# Patient Record
Sex: Male | Born: 1950
Health system: Southern US, Community
[De-identification: ages and names within clinical notes are randomized; demographics above are authoritative.]

## PROBLEM LIST (undated history)

## (undated) ENCOUNTER — Emergency Department (HOSPITAL_COMMUNITY): Payer: Medicare Other

## (undated) DIAGNOSIS — R51 Headache: Secondary | ICD-10-CM

## (undated) DIAGNOSIS — M25511 Pain in right shoulder: Secondary | ICD-10-CM

## (undated) DIAGNOSIS — I219 Acute myocardial infarction, unspecified: Secondary | ICD-10-CM

## (undated) DIAGNOSIS — E785 Hyperlipidemia, unspecified: Secondary | ICD-10-CM

## (undated) DIAGNOSIS — J45909 Unspecified asthma, uncomplicated: Secondary | ICD-10-CM

## (undated) DIAGNOSIS — I10 Essential (primary) hypertension: Secondary | ICD-10-CM

## (undated) DIAGNOSIS — T7840XA Allergy, unspecified, initial encounter: Secondary | ICD-10-CM

## (undated) DIAGNOSIS — G8929 Other chronic pain: Secondary | ICD-10-CM

## (undated) DIAGNOSIS — I700 Atherosclerosis of aorta: Secondary | ICD-10-CM

## (undated) DIAGNOSIS — J449 Chronic obstructive pulmonary disease, unspecified: Secondary | ICD-10-CM

## (undated) DIAGNOSIS — M1990 Unspecified osteoarthritis, unspecified site: Secondary | ICD-10-CM

## (undated) DIAGNOSIS — R932 Abnormal findings on diagnostic imaging of liver and biliary tract: Secondary | ICD-10-CM

## (undated) DIAGNOSIS — M549 Dorsalgia, unspecified: Secondary | ICD-10-CM

## (undated) HISTORY — DX: Pain in right shoulder: M25.511

## (undated) HISTORY — DX: Allergy, unspecified, initial encounter: T78.40XA

## (undated) HISTORY — DX: Unspecified osteoarthritis, unspecified site: M19.90

## (undated) HISTORY — DX: Other chronic pain: G89.29

## (undated) HISTORY — DX: Dorsalgia, unspecified: M54.9

## (undated) HISTORY — DX: Hyperlipidemia, unspecified: E78.5

## (undated) HISTORY — DX: Chronic obstructive pulmonary disease, unspecified: J44.9

## (undated) HISTORY — DX: Unspecified asthma, uncomplicated: J45.909

## (undated) HISTORY — PX: NO PAST SURGERIES: SHX2092

---

## 2004-12-19 ENCOUNTER — Emergency Department (HOSPITAL_COMMUNITY): Admission: EM | Admit: 2004-12-19 | Discharge: 2004-12-20 | Payer: Self-pay | Admitting: Emergency Medicine

## 2004-12-20 ENCOUNTER — Emergency Department (HOSPITAL_COMMUNITY): Admission: EM | Admit: 2004-12-20 | Discharge: 2004-12-20 | Payer: Self-pay | Admitting: Emergency Medicine

## 2004-12-26 ENCOUNTER — Emergency Department (HOSPITAL_COMMUNITY): Admission: EM | Admit: 2004-12-26 | Discharge: 2004-12-26 | Payer: Self-pay | Admitting: Family Medicine

## 2005-01-08 ENCOUNTER — Ambulatory Visit: Payer: Self-pay | Admitting: Internal Medicine

## 2005-01-22 ENCOUNTER — Ambulatory Visit: Payer: Self-pay | Admitting: Internal Medicine

## 2005-05-10 ENCOUNTER — Ambulatory Visit: Payer: Self-pay | Admitting: Internal Medicine

## 2005-05-10 ENCOUNTER — Encounter (INDEPENDENT_AMBULATORY_CARE_PROVIDER_SITE_OTHER): Payer: Self-pay | Admitting: Internal Medicine

## 2005-05-15 ENCOUNTER — Ambulatory Visit: Payer: Self-pay | Admitting: Internal Medicine

## 2005-05-22 ENCOUNTER — Ambulatory Visit: Payer: Self-pay | Admitting: Internal Medicine

## 2005-05-22 ENCOUNTER — Encounter (INDEPENDENT_AMBULATORY_CARE_PROVIDER_SITE_OTHER): Payer: Self-pay | Admitting: *Deleted

## 2005-10-17 ENCOUNTER — Ambulatory Visit: Payer: Self-pay | Admitting: Internal Medicine

## 2005-10-29 ENCOUNTER — Ambulatory Visit: Payer: Self-pay | Admitting: Hospitalist

## 2006-01-14 ENCOUNTER — Ambulatory Visit (HOSPITAL_COMMUNITY): Admission: RE | Admit: 2006-01-14 | Discharge: 2006-01-14 | Payer: Self-pay | Admitting: *Deleted

## 2006-01-14 ENCOUNTER — Ambulatory Visit: Payer: Self-pay | Admitting: Internal Medicine

## 2006-01-22 ENCOUNTER — Encounter: Admission: RE | Admit: 2006-01-22 | Discharge: 2006-02-06 | Payer: Self-pay | Admitting: *Deleted

## 2006-05-08 DIAGNOSIS — Z8719 Personal history of other diseases of the digestive system: Secondary | ICD-10-CM | POA: Insufficient documentation

## 2006-05-08 DIAGNOSIS — I10 Essential (primary) hypertension: Secondary | ICD-10-CM | POA: Insufficient documentation

## 2006-05-08 DIAGNOSIS — M545 Low back pain, unspecified: Secondary | ICD-10-CM | POA: Insufficient documentation

## 2006-05-08 DIAGNOSIS — F17200 Nicotine dependence, unspecified, uncomplicated: Secondary | ICD-10-CM | POA: Insufficient documentation

## 2006-05-08 DIAGNOSIS — M1990 Unspecified osteoarthritis, unspecified site: Secondary | ICD-10-CM | POA: Insufficient documentation

## 2006-07-10 DIAGNOSIS — K5730 Diverticulosis of large intestine without perforation or abscess without bleeding: Secondary | ICD-10-CM | POA: Insufficient documentation

## 2006-07-10 DIAGNOSIS — Z860100 Personal history of colon polyps, unspecified: Secondary | ICD-10-CM | POA: Insufficient documentation

## 2007-01-01 ENCOUNTER — Encounter (INDEPENDENT_AMBULATORY_CARE_PROVIDER_SITE_OTHER): Payer: Self-pay | Admitting: Internal Medicine

## 2010-07-06 ENCOUNTER — Inpatient Hospital Stay (HOSPITAL_COMMUNITY)
Admission: EM | Admit: 2010-07-06 | Discharge: 2010-07-09 | Payer: Self-pay | Source: Home / Self Care | Attending: Internal Medicine | Admitting: Internal Medicine

## 2010-07-06 LAB — CK TOTAL AND CKMB (NOT AT ARMC)
CK, MB: 3.7 ng/mL (ref 0.3–4.0)
Relative Index: 3 — ABNORMAL HIGH (ref 0.0–2.5)
Total CK: 123 U/L (ref 7–232)

## 2010-07-06 LAB — CBC
HCT: 48.8 % (ref 39.0–52.0)
Hemoglobin: 16.6 g/dL (ref 13.0–17.0)
MCH: 32.2 pg (ref 26.0–34.0)
MCHC: 34 g/dL (ref 30.0–36.0)
MCV: 94.8 fL (ref 78.0–100.0)
Platelets: 170 10*3/uL (ref 150–400)
RBC: 5.15 MIL/uL (ref 4.22–5.81)
RDW: 14 % (ref 11.5–15.5)
WBC: 5.2 10*3/uL (ref 4.0–10.5)

## 2010-07-06 LAB — URINALYSIS, ROUTINE W REFLEX MICROSCOPIC
Bilirubin Urine: NEGATIVE
Ketones, ur: NEGATIVE mg/dL
Leukocytes, UA: NEGATIVE
Nitrite: NEGATIVE
Protein, ur: 30 mg/dL — AB
Specific Gravity, Urine: 1.013 (ref 1.005–1.030)
Urine Glucose, Fasting: NEGATIVE mg/dL
Urobilinogen, UA: 0.2 mg/dL (ref 0.0–1.0)
pH: 6 (ref 5.0–8.0)

## 2010-07-06 LAB — BASIC METABOLIC PANEL
BUN: 16 mg/dL (ref 6–23)
CO2: 23 mEq/L (ref 19–32)
Calcium: 9.6 mg/dL (ref 8.4–10.5)
Chloride: 106 mEq/L (ref 96–112)
Creatinine, Ser: 1.22 mg/dL (ref 0.4–1.5)
GFR calc Af Amer: >60 mL/min (ref >60)
GFR calc non Af Amer: >60 mL/min (ref >60)
Glucose, Bld: 111 mg/dL — ABNORMAL HIGH (ref 70–99)
Potassium: 3.8 mEq/L (ref 3.5–5.1)
Sodium: 141 mEq/L (ref 135–145)

## 2010-07-06 LAB — PROTIME-INR
INR: 1.1 (ref 0.00–1.49)
Prothrombin Time: 14.4 seconds (ref 11.6–15.2)

## 2010-07-06 LAB — DIFFERENTIAL
Basophils Absolute: 0 10*3/uL (ref 0.0–0.1)
Basophils Relative: 1 % (ref 0–1)
Eosinophils Absolute: 0 10*3/uL (ref 0.0–0.7)
Eosinophils Relative: 0 % (ref 0–5)
Lymphocytes Relative: 37 % (ref 12–46)
Lymphs Abs: 1.9 10*3/uL (ref 0.7–4.0)
Monocytes Absolute: 0.5 10*3/uL (ref 0.1–1.0)
Monocytes Relative: 10 % (ref 3–12)
Neutro Abs: 2.7 10*3/uL (ref 1.7–7.7)
Neutrophils Relative %: 53 % (ref 43–77)

## 2010-07-06 LAB — URINE MICROSCOPIC-ADD ON

## 2010-07-06 LAB — APTT: aPTT: 32 seconds (ref 24–37)

## 2010-07-06 LAB — TROPONIN I: Troponin I: 0.05 ng/mL (ref 0.00–0.06)

## 2010-07-16 LAB — BASIC METABOLIC PANEL
BUN: 9 mg/dL (ref 6–23)
BUN: 13 mg/dL (ref 6–23)
CO2: 24 mEq/L (ref 19–32)
CO2: 25 mEq/L (ref 19–32)
Calcium: 8.9 mg/dL (ref 8.4–10.5)
Calcium: 9.3 mg/dL (ref 8.4–10.5)
Chloride: 104 mEq/L (ref 96–112)
Chloride: 104 mEq/L (ref 96–112)
Creatinine, Ser: 1.33 mg/dL (ref 0.4–1.5)
Creatinine, Ser: 1.25 mg/dL (ref 0.4–1.5)
GFR calc Af Amer: >60 mL/min (ref >60)
GFR calc Af Amer: >60 mL/min (ref >60)
GFR calc non Af Amer: 55 mL/min — ABNORMAL LOW (ref >60)
GFR calc non Af Amer: 60 mL/min — ABNORMAL LOW (ref >60)
Glucose, Bld: 115 mg/dL — ABNORMAL HIGH (ref 70–99)
Glucose, Bld: 100 mg/dL — ABNORMAL HIGH (ref 70–99)
Potassium: 3.2 mEq/L — ABNORMAL LOW (ref 3.5–5.1)
Potassium: 3.4 mEq/L — ABNORMAL LOW (ref 3.5–5.1)
Sodium: 137 mEq/L (ref 135–145)
Sodium: 139 mEq/L (ref 135–145)

## 2010-07-16 LAB — RAPID URINE DRUG SCREEN, HOSP PERFORMED
Amphetamines: NOT DETECTED
Barbiturates: NOT DETECTED
Benzodiazepines: NOT DETECTED
Cocaine: POSITIVE — AB
Opiates: NOT DETECTED
Tetrahydrocannabinol: NOT DETECTED

## 2010-07-16 LAB — CARDIAC PANEL(CRET KIN+CKTOT+MB+TROPI)
CK, MB: 3.1 ng/mL (ref 0.3–4.0)
CK, MB: 2.5 ng/mL (ref 0.3–4.0)
Relative Index: INVALID (ref 0.0–2.5)
Relative Index: INVALID (ref 0.0–2.5)
Total CK: 89 U/L (ref 7–232)
Total CK: 81 U/L (ref 7–232)
Troponin I: 0.04 ng/mL (ref 0.00–0.06)
Troponin I: 0.05 ng/mL (ref 0.00–0.06)

## 2010-07-16 LAB — CBC
HCT: 45 % (ref 39.0–52.0)
HCT: 43.1 % (ref 39.0–52.0)
Hemoglobin: 14.7 g/dL (ref 13.0–17.0)
Hemoglobin: 13.7 g/dL (ref 13.0–17.0)
MCH: 31.1 pg (ref 26.0–34.0)
MCH: 30.3 pg (ref 26.0–34.0)
MCHC: 32.7 g/dL (ref 30.0–36.0)
MCHC: 31.8 g/dL (ref 30.0–36.0)
MCV: 95.1 fL (ref 78.0–100.0)
MCV: 95.4 fL (ref 78.0–100.0)
Platelets: 156 10*3/uL (ref 150–400)
Platelets: 163 10*3/uL (ref 150–400)
RBC: 4.73 MIL/uL (ref 4.22–5.81)
RBC: 4.52 MIL/uL (ref 4.22–5.81)
RDW: 13.9 % (ref 11.5–15.5)
RDW: 13.9 % (ref 11.5–15.5)
WBC: 8.2 10*3/uL (ref 4.0–10.5)
WBC: 8.3 10*3/uL (ref 4.0–10.5)

## 2010-07-16 LAB — HEMOGLOBIN A1C
Hgb A1c MFr Bld: 5.7 % — ABNORMAL HIGH (ref <5.7)
Mean Plasma Glucose: 117 mg/dL — ABNORMAL HIGH (ref <117)

## 2010-07-16 LAB — CK TOTAL AND CKMB (NOT AT ARMC)
CK, MB: 3.2 ng/mL (ref 0.3–4.0)
Relative Index: INVALID (ref 0.0–2.5)
Total CK: 94 U/L (ref 7–232)

## 2010-07-16 LAB — MRSA PCR SCREENING: MRSA by PCR: NEGATIVE

## 2010-07-16 LAB — HEPATIC FUNCTION PANEL
ALT: 18 U/L (ref 0–53)
AST: 20 U/L (ref 0–37)
Albumin: 3.2 g/dL — ABNORMAL LOW (ref 3.5–5.2)
Alkaline Phosphatase: 69 U/L (ref 39–117)
Bilirubin, Direct: 0.2 mg/dL (ref 0.0–0.3)
Indirect Bilirubin: 0.7 mg/dL (ref 0.3–0.9)
Total Bilirubin: 0.9 mg/dL (ref 0.3–1.2)
Total Protein: 6 g/dL (ref 6.0–8.3)

## 2010-07-16 LAB — TSH: TSH: 0.759 u[IU]/mL (ref 0.350–4.500)

## 2010-07-16 LAB — LIPID PANEL
Cholesterol: 175 mg/dL (ref 0–200)
HDL: 117 mg/dL (ref >39)
LDL Cholesterol: 32 mg/dL (ref 0–99)
Total CHOL/HDL Ratio: 1.5 RATIO
Triglycerides: 130 mg/dL (ref <150)
VLDL: 26 mg/dL (ref 0–40)

## 2010-07-16 LAB — TROPONIN I: Troponin I: 0.04 ng/mL (ref 0.00–0.06)

## 2010-07-20 NOTE — H&P (Signed)
NAME:  Steven Delacruz, Steven Delacruz             ACCOUNT NO.:  [id number]  MEDICAL RECORD NO.:  [id number]          PATIENT TYPE:  INP  LOCATION:  0101                         FACILITY:  [facility]  PHYSICIAN:  Hartley Barefoot, MD    DATE OF BIRTH:  [birth year]  DATE OF ADMISSION:  07/06/2010 DATE OF DISCHARGE:                             HISTORY & PHYSICAL   PRIMARY CARE PROVIDER:  Dr. Dareen Piano.  Patient is being admitted to triad hospitalist, [facility], team #4.  CHIEF COMPLAINT:  Chest pain.  HISTORY OF PRESENT ILLNESS:  Steven Delacruz is a very pleasant 60 year old male with a history of uncontrolled hypertension who presents to the [facility] ED with a chief complaint of chest pain.  Information is obtained from the patient.  He states that last evening, he developed substernal chest pain.  He describes the pain as nonradiating, tightness, and squeezing sensation.  He states that the pain was intermittent and he thought it was heartburn.  This morning, he went on to work and noticed that the pain increased with physical exertion.  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  He went to the employee health nurse who referred him to the emergency room for hypertension.  Upon arrival to the ED, his blood pressure was 233/134. The patient admits being diagnosed with hypertension approximately 5 years ago, was on medication, but has not taking any medication for the last 2 years.  The patient denies any headache, dizziness, shortness of breath, diaphoresis, nausea, or vomiting.  He does report some brief tingling of his left arm last week.  He denies any weakness of extremities or syncope or near syncope.  He rates the pain at 9 out of 10 at worst and 5 out of 10 at best.  He indicates that the pain is not increased with deep breathing.  Exertion makes the pain worse.  Rest makes the pain better.  Nitroglycerin drip was started in the ED.  At the time of my interview, his blood  pressure was 206/128.  Symptoms came on, gradually have persisted, are characterized as moderate to severe. We are asked to admit for further evaluation and treatment.  ALLERGIES:  No known drug allergies.  PAST MEDICAL HISTORY:  Hypertension.  PAST SURGICAL HISTORY:  None.  FAMILY MEDICAL HISTORY:  Mother deceased in her late 76s.  She was diabetic and had an MI.  Father deceased in his late 22s.  He had hypertension and died from an MI.  There is no cancer or stroke history.  SOCIAL HISTORY:  The patient lives alone.  He smokes 1-2 packs a day and has done so since he was 60 years old.  He also drinks four 40 ounce beers daily.  He denies any other drug use.  He is employed as an Training and development officer person at [employer].  MEDICATIONS:  None.  REVIEW OF SYSTEMS:  GENERAL:  Negative for fever, chills, anorexia, unintentional weight loss.  ENT: Negative for ear pain, nasal congestion, sore throat.  CV:  See HPI.  RESPIRATORY:  Negative for shortness of breath or cough.  MUSCULOSKELETAL:  Negative for joint  pain, muscle weakness.  NEURO:  Negative for headache, dizziness, photophobia.  GI:  Negative for abdominal pain, nausea, vomiting, constipation, diarrhea.  GU:  Negative for dysuria, hematuria, frequency, or urgency.  PSYCH:  Negative for depression, anxiety.  HEME: Negative for any unusual bruising or bleeding.  LABORATORY DATA:  Sodium 141, potassium 3.8, chloride 106, CO2 of 23, BUN 16, creatinine 1.2, glucose 111.  WBCs 5.2, hemoglobin 16.6, hematocrit 48.8, platelets are 170.  Urinalysis was negative.  PTT 32, PT 14.4, INR 1.1.  Total CK 123.  CK-MB 3.7.  Troponin 0.05.  EKG was reviewed by Dr. Sunnie Nielsen.  Chest x-ray yields probable slight upper lobe emphysema.  No acute findings.  PHYSICAL EXAMINATION:  VITAL SIGNS:  Temperature 96.6, blood pressure 206/128, heart rate 94, respirations 16, saturations 98% on 2 L. GENERAL:  Awake, alert, well nourished, well  hydrated, in no acute distress. HEENT:  Head, normocephalic, atraumatic.  Pupils equal, round, reactive to light.  EOMI.  Mucous membranes are moist and pink.  No obvious lesion or exudate in his nose or ears. NECK:  Supple.  No JVD.  Full range of motion.  No lymphadenopathy. CV: Regular rate and rhythm.  No murmur, gallop, or rub. RESPIRATORY:  No increased work of breathing.  Breath sounds are clear to auscultation bilaterally.  No rhonchi, wheezes, or rales. ABDOMEN:  Round, soft, positive bowel sounds throughout, nontender to palpation.  No mass or organomegaly noted. NEURO:  Alert and oriented x3.  Speech clear.  Facial symmetry.  Cranial nerves II through XII grossly intact. MUSCULOSKELETAL:  Moves all extremities.  No joint swelling/erythema. Full range of motion. EXTREMITIES:  Without clubbing and cyanosis.  No lower extremity edema. Pedal pulses are present and palpable.  ASSESSMENT AND PLAN: 1. Chest pain.  Risks factors include uncontrolled hypertension,     smoking, strong family history, we will admit to step-down.  We     will cycle cardiac enzymes, get serial EKGs, provide O2 support.  We will offer antiemetics and analgesics p.r.n.  We ill get a 2-D     echo and a CT angio of the chest, abdomen, and pelvis to rule out     dissection.  We will start aspirin, statin. 2. Hypertensive Emergency, Blood pressure is 204/144     on exam.  We will continue nitroglycerin drip with a systolic blood     pressure goal of 180 initially, diastolic goal range 90-100.  We will also     add lisinopril and Norvasc.  We will request a cardiology consult. Check UDS. 3. EtOH abuse.  The patient drinks four 40 ounces of beer a day.  We     will start EtOH withdrawal protocol as needed. 4. Ongoing tobacco use.  We will provide tobacco cessation counseling. 5. Deep vein thrombosis prophylaxis.  We will use Lovenox. 6. Code status.  Patient is a full code.  This assessment and plan was  discussed with Dr. Sunnie Nielsen.     Gwenyth Bender, NP   ______________________________ Hartley Barefoot, MD    KMB/MEDQ  D:  07/06/2010  T:  07/06/2010  Job:  [id number]  Electronically Signed by Hartley Barefoot MD on 07/06/2010 10:17:06 PM Electronically Signed by Toya Smothers  on 07/20/2010 08:25:55 AM

## 2010-07-26 NOTE — Discharge Summary (Addendum)
  NAME:  Steven Delacruz, FOUCH             ACCOUNT NO.:  [id number]  MEDICAL RECORD NO.:  [id number]          PATIENT TYPE:  INP  LOCATION:  1236                         FACILITY:  [facility]  PHYSICIAN:  Angeliz Settlemyre I Daylon Lafavor, MD      DATE OF BIRTH:  12-18-1950  DATE OF ADMISSION:  07/06/2010 DATE OF DISCHARGE:  07/09/2010                              DISCHARGE SUMMARY   PRIMARY CARE PHYSICIAN:  Dr. Dareen Piano.  DISCHARGE DIAGNOSES: 1. Hypertensive urgency/emergency. 2. Chest pain, felt to be secondary to hypertensive emergency. 3. Hypokalemia. 4. Cocaine abuse and alcohol abuse. 5. Ongoing tobacco abuse.  DISCHARGE MEDICATIONS: 1. Norvasc 10 mg p.o. daily. 2. Aspirin 81 mg. 3. Folic acid 1 mg p.o. daily. 4. Hydralazine 75 mg p.o. q.8 h. 5. Multivitamin 1 tab daily. 6. Thiamine 100 mg p.o. daily. 7. Hydrochlorothiazide 25 mg p.o. daily.  CONSULTATION:  Cardiology consulted, done by Dr. Lacretia Nicks. Viann Fish.  PROCEDURES: 1. 2-D echo.  Normal cavity wall.  severe LVH, systolic     function within normal.  Ejection fraction 60% to 65%.  There was     no regional wall motion abnormality.  Doppler parameter consistent     with abnormal  left ventricular relaxation. 2. CT angiogram, negative emphysematous change involving the upper     lobe. 3. CT of abdomen and pelvis:  No evidence of aortic aneurysm.     Moderate atherosclerotic disease.  Diffuse colonic diverticulosis,     bilateral inguinal hernia containing fat.  HISTORY OF PRESENT ILLNESS:  This is a 60 year old male with a history of uncontrolled hypertension presented with chief complaint of chest pain.  The patient is noncompliant with his antihypertensive medication. He has stopped taking them for more than 2 years.  He presented with substernal chest pain, nonradiating with tightness, and a squeezing sensation.  He stated that the pain was intermittent and he thought it was heartburn.  In the ED, he was found to have blood pressure  233/134.  The patient was admitted to step down unit and kept under close observation and titrating his blood pressure medication carefully, was done to avoid formation of any pulmonary edema and avoiding any beta-blocker secondary to his cocaine abuse, which documented by his urinalysis.  The patient will be discharged with hydralazine 75 mg, Norvasc, and hydrochlorothiazide.  The patient was advised to follow up with his primary care physician as early as next week, also counseling again on his cocaine, alcohol, and smoking was done by me during his hospital stay.  Chest pain, atypical.  Felt to be secondary to hypertensive emergency. Currently, we felt that the patient is medically stable to be discharged, need to follow up with his primary care physician as early as next week.     [name], MD     HIE/MEDQ  D:  07/09/2010  T:  07/10/2010  Job:  [id number]  Electronically Signed by Ebony Cargo MD on 07/26/2010 11:54:42 AM

## 2010-07-31 NOTE — Procedures (Signed)
Summary: Colonoscopy: Dr.Gessner  Colonoscopy: Dr.Gessner   Imported By: Florinda Marker 01/01/2007 14:48:55  _____________________________________________________________________  External Attachment:    Type:   Image     Comment:   External Document

## 2012-12-10 ENCOUNTER — Emergency Department (HOSPITAL_COMMUNITY): Payer: Self-pay

## 2012-12-10 ENCOUNTER — Emergency Department (HOSPITAL_COMMUNITY)
Admission: EM | Admit: 2012-12-10 | Discharge: 2012-12-10 | Disposition: A | Discharge status: Home / Self Care | Payer: Self-pay | Attending: Emergency Medicine | Admitting: Emergency Medicine

## 2012-12-10 ENCOUNTER — Encounter (HOSPITAL_COMMUNITY): Payer: Self-pay | Admitting: *Deleted

## 2012-12-10 DIAGNOSIS — M545 Low back pain, unspecified: Secondary | ICD-10-CM | POA: Insufficient documentation

## 2012-12-10 DIAGNOSIS — M549 Dorsalgia, unspecified: Secondary | ICD-10-CM

## 2012-12-10 DIAGNOSIS — Z880 Allergy status to penicillin: Secondary | ICD-10-CM | POA: Insufficient documentation

## 2012-12-10 DIAGNOSIS — M25519 Pain in unspecified shoulder: Secondary | ICD-10-CM | POA: Insufficient documentation

## 2012-12-10 DIAGNOSIS — M25511 Pain in right shoulder: Secondary | ICD-10-CM

## 2012-12-10 HISTORY — DX: Essential (primary) hypertension: I10

## 2012-12-10 MED ORDER — HYDROCODONE-ACETAMINOPHEN 5-325 MG PO TABS: 1.0000 | ORAL_TABLET | ORAL | Status: DC | PRN

## 2012-12-10 NOTE — Progress Notes (Signed)
Consulted by ED RN about pt voiced concerned about being uninsured Cm referred to [facility] staff member

## 2012-12-10 NOTE — Progress Notes (Signed)
[facility] has seen patient and provided him with a oc application. °

## 2012-12-10 NOTE — ED Provider Notes (Signed)
History     CSN: [id number]  Arrival date & time 12/10/12  1131   First MD Initiated Contact with Patient 12/10/12 1142      No chief complaint on file.   (Consider location/radiation/quality/duration/timing/severity/associated sxs/prior treatment) HPI Comments: Pt presents to the ED for low back pain and right shoulder pain.  States these areas have been bothering him for several years but have recently gotten worse.  Pain described as an aching sensation, worse in the morning, with some radiation into BLE and right arm.  Notes intermittent paresthesias of RUE, relieved by shaking his arm and moving fingers.  No recent injury, trauma, or fall.  No loss of bowel or bladder function.  No saddle or LE numbness or paresthesias.  Gait at baseline- walks assisted with cane.  Has been taking aleve and tramadol without significant relief of sx.  Pt does not currently have a PCP.  The history is provided by the patient.    No past medical history on file.  No past surgical history on file.  No family history on file.  History  Substance Use Topics  . Smoking status: Not on file  . Smokeless tobacco: Not on file  . Alcohol Use: Not on file      Review of Systems  Musculoskeletal: Positive for arthralgias.  All other systems reviewed and are negative.    Allergies  Penicillins  Home Medications  No current outpatient prescriptions on file.  BP 144/87  Pulse 66  Temp(Src) 98.1 F (36.7 C) (Oral)  Resp 18  SpO2 99%  Physical Exam  Nursing note and vitals reviewed. Constitutional: He is oriented to person, place, and time. He appears well-developed and well-nourished.  HENT:  Head: Normocephalic and atraumatic.  Eyes: Conjunctivae and EOM are normal.  Neck: Normal range of motion. Neck supple.  Cardiovascular: Normal rate, regular rhythm and normal heart sounds.   Pulmonary/Chest: Effort normal and breath sounds normal. No respiratory distress. He has no wheezes.   Musculoskeletal: Normal range of motion.       Right shoulder: He exhibits normal range of motion, no tenderness, no bony tenderness, no swelling, no crepitus, no deformity, no laceration, no spasm, normal pulse and normal strength.       Cervical back: He exhibits tenderness (right side of trapezius) and pain. He exhibits normal range of motion, no bony tenderness, no swelling, no edema, no deformity, no laceration, no spasm and normal pulse.       Lumbar back: He exhibits pain. He exhibits normal range of motion, no tenderness, no bony tenderness, no swelling, no edema, no deformity, no laceration, no spasm and normal pulse.  Full ROM of right shoulder, some mild pain and paresthesias when lifting arm above head; TTP of right side of trapezius, no bony tenderness or deformity; strong radial pulse and cap refill, sensation intact LS without pinpoint tenderness but with diffuse ache, distal pulses and sensation intact  Neurological: He is alert and oriented to person, place, and time. He has normal strength. No cranial nerve deficit or sensory deficit.  CN grossly intact, no acute neuro deficits appreciated, gait at baseline- walks assisted with cane  Skin: Skin is warm and dry.  Psychiatric: He has a normal mood and affect.    ED Course  Procedures (including critical care time)  Labs Reviewed - No data to display Dg Cervical Spine Complete  12/10/2012   *RADIOLOGY REPORT*  Clinical Data: Right shoulder pain.  Neck pain.  CERVICAL SPINE -  COMPLETE 4+ VIEW  Comparison: None.  Findings: Anatomic alignment cervical spine.  Mild to moderate cervical spondylosis at C4-C5 and C5-C6.  Degenerative disc disease at C6-C7 is mild, with mild disc space narrowing.  There is no cervical spine fracture.  Mild uncovertebral spurring without significant bony foraminal encroachment identified on the oblique views.  Odontoid intact.  Prevertebral soft tissues normal. Craniocervical alignment normal.  IMPRESSION:  Mild moderate cervical spondylosis from C4-C7.  No acute abnormality.   Original Report Authenticated By: Andreas Newport, M.D.   Dg Lumbar Spine Complete  12/10/2012   *RADIOLOGY REPORT*  Clinical Data: Low back pain.  LUMBAR SPINE - COMPLETE 4+ VIEW  Comparison: 07/07/2010 CT  Findings: Normal alignment is noted. There is no evidence of fracture or subluxation. Mild multilevel degenerative disc disease is identified throughout the lumbar spine with moderate degenerative disc disease at the lumbosacral junction. No focal bony lesions or spondylolysis noted. Mild facet arthropathy in the lower lumbar spine is present.  IMPRESSION: No evidence of acute bony abnormality.  Degenerative changes within the lumbar spine again noted.   Original Report Authenticated By: Harmon Pier, M.D.     1. Back pain   2. Right shoulder pain      MDM: X-rays as above.  Suspect aching sx due to DJD and RUE paresthesias due to spondylosis.  No saddle/extremity numbness or paresthesias, distal pulses intact.  Doubt SCI, cauda equina, dissection, or other vascular collapse.  Pt does not have a PCP- encouraged to establish care with a local physician to monitor sx, resource guide give.  Rx vicodin.  Discussed plan with pt, he agreed.  Return precautions advised.        Garlon Hatchet, PA-C 12/10/12 2049  Garlon Hatchet, PA-C 12/10/12 2050

## 2012-12-10 NOTE — ED Notes (Signed)
Pt reports lower back pain that is radiating to bilateral thighs, pt also c/o right shoulder pain. Pt sts he was taking aleve and tramadol for pain without relief.

## 2012-12-12 NOTE — ED Provider Notes (Signed)
Medical screening examination/treatment/procedure(s) were performed by non-physician practitioner and as supervising physician I was immediately available for consultation/collaboration.  Ethelda Chick, MD 12/12/12 [phone]

## 2013-02-11 ENCOUNTER — Emergency Department (HOSPITAL_COMMUNITY)
Admission: EM | Admit: 2013-02-11 | Discharge: 2013-02-11 | Disposition: A | Discharge status: Home / Self Care | Payer: Self-pay | Attending: Emergency Medicine | Admitting: Emergency Medicine

## 2013-02-11 ENCOUNTER — Encounter (HOSPITAL_COMMUNITY): Payer: Self-pay | Admitting: *Deleted

## 2013-02-11 DIAGNOSIS — Z7982 Long term (current) use of aspirin: Secondary | ICD-10-CM | POA: Insufficient documentation

## 2013-02-11 DIAGNOSIS — I10 Essential (primary) hypertension: Secondary | ICD-10-CM | POA: Insufficient documentation

## 2013-02-11 DIAGNOSIS — IMO0001 Reserved for inherently not codable concepts without codable children: Secondary | ICD-10-CM | POA: Insufficient documentation

## 2013-02-11 DIAGNOSIS — R0602 Shortness of breath: Secondary | ICD-10-CM | POA: Insufficient documentation

## 2013-02-11 DIAGNOSIS — Z880 Allergy status to penicillin: Secondary | ICD-10-CM | POA: Insufficient documentation

## 2013-02-11 DIAGNOSIS — F17200 Nicotine dependence, unspecified, uncomplicated: Secondary | ICD-10-CM | POA: Insufficient documentation

## 2013-02-11 DIAGNOSIS — M549 Dorsalgia, unspecified: Secondary | ICD-10-CM | POA: Insufficient documentation

## 2013-02-11 DIAGNOSIS — K921 Melena: Secondary | ICD-10-CM | POA: Insufficient documentation

## 2013-02-11 LAB — BASIC METABOLIC PANEL
BUN: 7 mg/dL (ref 6–23)
CO2: 23 mEq/L (ref 19–32)
Calcium: 9.7 mg/dL (ref 8.4–10.5)
Chloride: 105 mEq/L (ref 96–112)
Creatinine, Ser: 1 mg/dL (ref 0.50–1.35)
GFR calc Af Amer: >90 mL/min (ref >90)
GFR calc non Af Amer: 80 mL/min — ABNORMAL LOW (ref >90)
Glucose, Bld: 114 mg/dL — ABNORMAL HIGH (ref 70–99)
Potassium: 3.2 mEq/L — ABNORMAL LOW (ref 3.5–5.1)
Sodium: 141 mEq/L (ref 135–145)

## 2013-02-11 LAB — CBC
HCT: 47.1 % (ref 39.0–52.0)
Hemoglobin: 16.5 g/dL (ref 13.0–17.0)
MCH: 32.5 pg (ref 26.0–34.0)
MCHC: 35 g/dL (ref 30.0–36.0)
MCV: 92.9 fL (ref 78.0–100.0)
Platelets: 154 10*3/uL (ref 150–400)
RBC: 5.07 MIL/uL (ref 4.22–5.81)
RDW: 14.1 % (ref 11.5–15.5)
WBC: 5.4 10*3/uL (ref 4.0–10.5)

## 2013-02-11 MED ORDER — ATENOLOL 50 MG PO TABS: 50.0000 mg | ORAL_TABLET | Freq: Every day | ORAL | Status: DC

## 2013-02-11 MED ORDER — AMLODIPINE BESYLATE 10 MG PO TABS
10.0000 mg | ORAL_TABLET | ORAL | Status: AC
  Administered 2013-02-11: 10 mg via ORAL
  Filled 2013-02-11 (×2): qty 1

## 2013-02-11 MED ORDER — POTASSIUM CHLORIDE CRYS ER 20 MEQ PO TBCR
40.0000 meq | EXTENDED_RELEASE_TABLET | Freq: Once | ORAL | Status: AC
  Administered 2013-02-11: 40 meq via ORAL
  Filled 2013-02-11: qty 2

## 2013-02-11 MED ORDER — AMLODIPINE BESYLATE 10 MG PO TABS: 10.0000 mg | ORAL_TABLET | Freq: Every day | ORAL | Status: DC

## 2013-02-11 MED ORDER — HYDROCHLOROTHIAZIDE 25 MG PO TABS: 25.0000 mg | ORAL_TABLET | Freq: Every day | ORAL | Status: DC

## 2013-02-11 MED ORDER — HYDROCHLOROTHIAZIDE 25 MG PO TABS
25.0000 mg | ORAL_TABLET | Freq: Every day | ORAL | Status: DC
  Administered 2013-02-11: 25 mg via ORAL
  Filled 2013-02-11 (×3): qty 1

## 2013-02-11 NOTE — ED Notes (Signed)
Pt ambulatory upon d/c. Pt given d/c teaching, prescriptions and follow up care instructions. Pt verbalizes understanding of d/c teaching. Dr Adriana Simas acknowledges pts blood pressure upon d/c and verifies pt stable for d/c at this time. Pt ambulatory leaving ER with family. NAD noted upon d/c.

## 2013-02-11 NOTE — ED Notes (Signed)
Dr Cook at bedside

## 2013-02-11 NOTE — ED Notes (Signed)
Pt went to [facility] for regular check-up and was found to have htn.  BP here 208/108.  Denies headache or blurred vision.  Unable to fill bp prescriptions.

## 2013-02-11 NOTE — ED Provider Notes (Signed)
CSN: [id number]     Arrival date & time 02/11/13  1023 History     First MD Initiated Contact with Patient 02/11/13 1113     Chief Complaint  Patient presents with  . Hypertension   (Consider location/radiation/quality/duration/timing/severity/associated sxs/prior Treatment) HPI 62 year old male with history of HTN presents with elevated BP.  Patient reports that he went to [facility] today for a visit and was found to have BP of 208/108.  Patient reports that he has been unable to get his BP medications for approximately 3 months.  He currently is feeling well. He denies chest pain, headache, blurry vision, nausea, vomiting.  He does report some SOB (which is chronic for him), low back pain and right arm pain.  Past Medical History  Diagnosis Date  . Hypertension    History reviewed. No pertinent past surgical history. No family history on file. History  Substance Use Topics  . Smoking status: Current Every Day Smoker -- 1.00 packs/day    Types: Cigarettes  . Smokeless tobacco: Not on file  . Alcohol Use: Yes    Review of Systems  Constitutional: Negative for fever and chills.  Respiratory: Positive for shortness of breath. Negative for chest tightness.   Cardiovascular: Negative for chest pain.  Gastrointestinal: Positive for blood in stool. Negative for nausea, vomiting and abdominal pain.  Genitourinary: Negative for difficulty urinating.  Musculoskeletal: Positive for back pain and arthralgias.  Skin: Negative for rash.  Neurological: Negative for dizziness and light-headedness.   Allergies  Lisinopril and Penicillins  Home Medications   Current Outpatient Rx  Name  Route  Sig  Dispense  Refill  . aspirin 325 MG tablet   Oral   Take 650 mg by mouth daily.           BP 188/108  Pulse 76  Temp(Src) 98.3 F (36.8 C) (Oral)  Resp 15  SpO2 98% Physical Exam  Constitutional: He is oriented to person, place, and time. He appears well-developed and well-nourished.  He appears distressed.  HENT:  Head: Normocephalic and atraumatic.  Eyes: Pupils are equal, round, and reactive to light. No scleral icterus.  Neck: Neck supple.  Cardiovascular: Regular rhythm.   No murmur heard. Tachycardic.  Pulmonary/Chest: Effort normal and breath sounds normal. No respiratory distress. He has no wheezes. He has no rales.  Abdominal: Soft.  Obese, nondistended, nontender.   Musculoskeletal: He exhibits no edema.  Neurological: He is alert and oriented to person, place, and time. No cranial nerve deficit.  Skin: Skin is warm and dry. No rash noted.   ED Course   Procedures (including critical care time)   Date: 02/11/2013  Rate: 84  Rhythm: normal sinus rhythm  QRS Axis: normal  Intervals: normal  ST/T Wave abnormalities: nonspecific ST changes  Conduction Disutrbances:none  Narrative interpretation: Normal sinus rhythm with nonspecific st changes and probable LAE  Old EKG Reviewed: none available  Labs Reviewed  BASIC METABOLIC PANEL - Abnormal; Notable for the following:    Potassium 3.2 (*)    Glucose, Bld 114 (*)    GFR calc non Af Amer 80 (*)    All other components within normal limits  CBC   No results found. No diagnosis found.  MDM  62 year old male with HTN present with elevated BP.  He has been out of his medication for ~ 3 months.   - EKG unremarkable. - No signs of end-organ damage via lab work or physical examination. - Patient given  HCTZ and Norvasc in the ED.  Patient unable to afford medications.  Case management saw patient set up with needed resources/medications.  - Will discharge home with Rx for prior meds.   Tommie Sams, DO 02/11/13 1358

## 2013-02-11 NOTE — Progress Notes (Addendum)
[facility] ED CM called by Dr. Adriana Simas in POD E in regards to medication assistance. Pt presented to ED with elevated B/P . In room to speak with patient Pt states, he is unemployed and has run out of BP medications and does not have medical insurance, or a PCP. As per patient, He goes to [facility] but does not have [employer] or Orange card. Pt made aware of MATCH Program and Guidelines, patient agrees to enrollment.Co payment has been overrided. MATCH letter given with instructions and pharmacies that participate in [facility]. Pt given information on the [employer] [facility] and [employer] along with instruction on scheduling a follow- up appointment.  Also Provided patient with a [employer], which list community resources within the [place] area. Verbal and written information provided [facility] Card application process. Will contact [facility] to reach out for follow-up. Patient verbalizes understanding and agrees with plan. No further CM needs identified.

## 2013-02-11 NOTE — ED Provider Notes (Signed)
I saw and evaluated the patient, reviewed the resident's note and I agree with the findings and plan. Pt to be treated for his htn and seen by social work and given meds  Toy Baker, MD 02/11/13 1400

## 2013-02-11 NOTE — ED Notes (Signed)
[facility] Steven Delacruz E Patient was given the orange card application to establish Primary Care. Ref'd to the [facility] to take completed application and to follow up after ED visit.

## 2013-02-18 ENCOUNTER — Ambulatory Visit: Payer: Self-pay

## 2013-02-19 NOTE — ED Provider Notes (Signed)
I saw and evaluated the patient, reviewed the resident's note and I agree with the findings and plan.  Toy Baker, MD 02/19/13 1120

## 2013-03-03 ENCOUNTER — Ambulatory Visit: Payer: Medicaid Other | Attending: Internal Medicine | Admitting: Internal Medicine

## 2013-03-03 VITALS — BP 133/76 | HR 68 | Temp 98.7°F | Resp 18 | Ht 71.65 in | Wt 228.0 lb

## 2013-03-03 DIAGNOSIS — M25511 Pain in right shoulder: Secondary | ICD-10-CM | POA: Insufficient documentation

## 2013-03-03 DIAGNOSIS — G8929 Other chronic pain: Secondary | ICD-10-CM | POA: Insufficient documentation

## 2013-03-03 LAB — BASIC METABOLIC PANEL
BUN: 15 mg/dL (ref 6–23)
CO2: 27 mEq/L (ref 19–32)
Calcium: 10 mg/dL (ref 8.4–10.5)
Chloride: 106 mEq/L (ref 96–112)
Creat: 1.19 mg/dL (ref 0.50–1.35)
Glucose, Bld: 106 mg/dL — ABNORMAL HIGH (ref 70–99)
Potassium: 4.2 mEq/L (ref 3.5–5.3)
Sodium: 142 mEq/L (ref 135–145)

## 2013-03-03 LAB — HEMOGLOBIN A1C
Hgb A1c MFr Bld: 6.3 % — ABNORMAL HIGH (ref <5.7)
Mean Plasma Glucose: 134 mg/dL — ABNORMAL HIGH (ref <117)

## 2013-03-03 LAB — LIPID PANEL
Cholesterol: 151 mg/dL (ref 0–200)
HDL: 62 mg/dL (ref >39)
LDL Cholesterol: 30 mg/dL (ref 0–99)
Total CHOL/HDL Ratio: 2.4 Ratio
Triglycerides: 297 mg/dL — ABNORMAL HIGH (ref <150)
VLDL: 59 mg/dL — ABNORMAL HIGH (ref 0–40)

## 2013-03-03 NOTE — Progress Notes (Unsigned)
Patient ID: Steven Delacruz, male   DOB: [birth year], 62 y.o.   MRN: [id number] Patient Demographics  Steven Delacruz, is a 62 y.o. male  CSN: [id number]  MRN: [id number]  DOB - [birth year]  Outpatient Primary MD for the patient is Jeanann Lewandowsky, MD   With History of -  Past Medical History  Diagnosis Date  . Hypertension     chronic back pain and right shoulder pain  No past surgical history on file.  in for   Chief Complaint  Patient presents with  . Hospitalization Follow-up     HPI  Steven Delacruz  is a 62 y.o. male, history of hypertension, chronic lower back pain and right shoulder pain comes in to establish care, he was recently in the ER for back pain where his x-rays were unremarkable, he had basic blood work done which showed mildly low potassium, unclear if it was replaced, besides his low back pain and right shoulder pain he has no subjective complaints, he does smoke one pack a day and uses one bottle of beer everyday. He denies any history of CAD CHF or stroke. No history of diabetes mellitus.    Review of Systems    In addition to the HPI above,   No Fever-chills, No Headache, No changes with Vision or hearing, No problems swallowing food or Liquids, No Chest pain, Cough or Shortness of Breath, No Abdominal pain, No Nausea or Vommitting, Bowel movements are regular, No Blood in stool or Urine, No dysuria, No new skin rashes or bruises, No new joints pains-aches, except chronic right shoulder pain and back pain, back pain is dull, worse with activity and radiates down the left leg, no lower activity weakness no bowel bladder incontinence. No new weakness, tingling, numbness in any extremity, No recent weight gain or loss, No polyuria, polydypsia or polyphagia, No significant Mental Stressors.  A full 10 point Review of Systems was done, except as stated above, all other Review of Systems were negative.   Social History History  Substance Use  Topics  . Smoking status: Current Every Day Smoker -- 1.00 packs/day    Types: Cigarettes  . Smokeless tobacco: Not on file  . Alcohol Use: Yes      Family History CAD in his dad  Prior to Admission medications   Medication Sig Start Date End Date Taking? Authorizing Provider  amLODipine (NORVASC) 10 MG tablet Take 1 tablet (10 mg total) by mouth daily. 02/11/13  Yes Tommie Sams, DO  aspirin 325 MG tablet Take 650 mg by mouth daily.    Yes Historical Provider, MD  atenolol (TENORMIN) 50 MG tablet Take 1 tablet (50 mg total) by mouth daily. 02/11/13  Yes Tommie Sams, DO  hydrochlorothiazide (HYDRODIURIL) 25 MG tablet Take 1 tablet (25 mg total) by mouth daily. 02/11/13  Yes Tommie Sams, DO    Allergies  Allergen Reactions  . Lisinopril Swelling  . Penicillins     REACTION: Unknown reaction    Physical Exam  Vitals  Blood pressure 133/76, pulse 68, temperature 98.7 F (37.1 C), temperature source Oral, resp. rate 18, height 5' 11.65" (1.82 m), weight 228 lb (103.42 kg), SpO2 98.00%.   1. General middle-aged African American male sitting on clinic examination table in no apparent distress,     2. Normal affect and insight, Not Suicidal or Homicidal, Awake Alert, Oriented X 3.  3. No F.N deficits, ALL C.Nerves Intact, Strength 5/5 all 4 extremities, Sensation intact all 4 extremities,  Plantars down going.  4. Ears and Eyes appear Normal, Conjunctivae clear, PERRLA. Moist Oral Mucosa.  5. Supple Neck, No JVD, No cervical lymphadenopathy appriciated, No Carotid Bruits.  6. Symmetrical Chest wall movement, Good air movement bilaterally, CTAB.  7. RRR, No Gallops, Rubs or Murmurs, No Parasternal Heave.  8. Positive Bowel Sounds, Abdomen Soft, Non tender, No organomegaly appriciated,No rebound -guarding or rigidity.  9.  No Cyanosis, Normal Skin Turgor, No Skin Rash or Bruise.  10. Good muscle tone,  joints appear normal , no effusions, Normal ROM. Except right shoulder  where he is unable to lift his arm beyond 90 anteriorly, no point tenderness, back exam is no point tenderness, straight leg raise test is negative bilaterally.  11. No Palpable Lymph Nodes in Neck or Axillae     Data Review  CBC No results found for this basename: WBC, HGB, HCT, PLT, MCV, MCH, MCHC, RDW, NEUTRABS, LYMPHSABS, MONOABS, EOSABS, BASOSABS, BANDABS, BANDSABD,  in the last 168 hours ------------------------------------------------------------------------------------------------------------------  Chemistries  No results found for this basename: NA, K, CL, CO2, GLUCOSE, BUN, CREATININE, GFRCGP, CALCIUM, MG, AST, ALT, ALKPHOS, BILITOT,  in the last 168 hours ------------------------------------------------------------------------------------------------------------------ estimated creatinine clearance is 97.1 ml/min (by C-G formula based on Cr of 1). ------------------------------------------------------------------------------------------------------------------ No results found for this basename: TSH, T4TOTAL, FREET3, T3FREE, THYROIDAB,  in the last 72 hours   Coagulation profile No results found for this basename: INR, PROTIME,  in the last 168 hours ------------------------------------------------------------------------------------------------------------------- No results found for this basename: DDIMER,  in the last 72 hours -------------------------------------------------------------------------------------------------------------------  Cardiac Enzymes No results found for this basename: CK, CKMB, TROPONINI, MYOGLOBIN,  in the last 168 hours ------------------------------------------------------------------------------------------------------------------ No components found with this basename: POCBNP,    ---------------------------------------------------------------------------------------------------------------  Urinalysis    Component Value Date/Time    COLORURINE YELLOW 07/06/2010 1303   APPEARANCEUR CLEAR 07/06/2010 1303   LABSPEC 1.013 07/06/2010 1303   PHURINE 6.0 07/06/2010 1303   BILIRUBINUR NEGATIVE 07/06/2010 1303   KETONESUR NEGATIVE 07/06/2010 1303   PROTEINUR 30* 07/06/2010 1303   UROBILINOGEN 0.2 07/06/2010 1303   NITRITE NEGATIVE 07/06/2010 1303   LEUKOCYTESUR NEGATIVE 07/06/2010 1303       Assessment and plan  Chronic back pain and right shoulder pain. Back x-rays were stable, right shoulder x-ray will be obtained, neurosurgery appointment has been recommended peripheral made for chronic back pain with radiation to left leg.  Smoking abuse counseled to quit smoking   Hypertension stable on home medications continue he takes accommodation of Norvasc, beta blocker and HCTZ.   Recent hypokalemia on ER labs. Unclear if it was replaced. Will repeat BMP.    Routine health maintenance.  Screening labs. CBC, BMP were noted and stable for low potassium, I have ordered TSH, A1c, lipid panel long with BMP to monitor potassium levels  Colonoscopy -last colonoscopy was 4 years ago and was stable  Immunizations flu shot given, tetanus shot still not available in the clinic will be given next visit        Leroy Sea M.D on 03/03/2013 at 12:34 PM

## 2013-03-03 NOTE — Progress Notes (Unsigned)
Pt is here to est care and for hosp a f/u Seen [facility] on 816 C/o back/shoulder pain.... Takes 2 aleve's and tramadol's w/no relief.... Pain increases w/activity Alert w/no signs of acute distress.

## 2013-03-04 LAB — TSH: TSH: 0.409 u[IU]/mL (ref 0.350–4.500)

## 2013-03-08 NOTE — Progress Notes (Signed)
Quick Note:  Patient should come back in a week for prediabetes and elevated cholesterol ______

## 2013-03-09 ENCOUNTER — Telehealth: Payer: Self-pay | Admitting: Emergency Medicine

## 2013-03-09 NOTE — Telephone Encounter (Signed)
Pt given lab results. States he need to reschedule 03/12/13 appt

## 2013-03-12 ENCOUNTER — Ambulatory Visit: Payer: Self-pay | Admitting: Internal Medicine

## 2013-03-18 ENCOUNTER — Ambulatory Visit: Payer: Self-pay | Attending: Internal Medicine | Admitting: Internal Medicine

## 2013-03-18 ENCOUNTER — Ambulatory Visit: Payer: Self-pay

## 2013-03-18 ENCOUNTER — Encounter: Payer: Self-pay | Admitting: Internal Medicine

## 2013-03-18 VITALS — BP 163/96 | HR 88 | Temp 98.4°F | Ht 72.0 in | Wt 232.8 lb

## 2013-03-18 DIAGNOSIS — R7309 Other abnormal glucose: Secondary | ICD-10-CM | POA: Insufficient documentation

## 2013-03-18 DIAGNOSIS — E781 Pure hyperglyceridemia: Secondary | ICD-10-CM | POA: Insufficient documentation

## 2013-03-18 DIAGNOSIS — I10 Essential (primary) hypertension: Secondary | ICD-10-CM | POA: Insufficient documentation

## 2013-03-18 DIAGNOSIS — R7303 Prediabetes: Secondary | ICD-10-CM | POA: Insufficient documentation

## 2013-03-18 DIAGNOSIS — F17200 Nicotine dependence, unspecified, uncomplicated: Secondary | ICD-10-CM | POA: Insufficient documentation

## 2013-03-18 NOTE — Progress Notes (Signed)
Patient ID: Steven Delacruz, male   DOB: Apr 29, 1951, 62 y.o.   MRN: [id number]  Chief complaint Lab ollowup  History of present illness 62 year old male with history of hypertension, chronic back pain who was seen in the clinic recently to establish care with lab work done showing prediabetes we do an A1c of 6.3 and elevated triglycerides. Patient blood pressure was also elevated on visit today. He reports that he hasn't out of his blood pressure medications for almost one week and the medications together cost him almost $27 and would not be able to refill them until next week. He says he doesn't have any money to refill any of the prescriptions. I discussed about his prediabetes and elevated triglycerides but he does not want to stop on any medications for them at this time and would like to modify his diet and try exercising regularly.  He denies any headache, blurred vision, dizziness, nausea, vomiting, chest pain, palpitations, shortness of breath, abdominal pain, bowel or urinary symptoms has chronic back pain and right shoulder pain.   Review of systems  as outlined in history of present illness  Objective:  Vital signs in last 24 hours:  Filed Vitals:   03/18/13 1210  BP: 163/96  Pulse: 88  Temp: 98.4 F (36.9 C)  TempSrc: Oral  Height: 6' (1.829 m)  Weight: 232 lb 12.8 oz (105.597 kg)  SpO2: 96%      Physical Exam:  General: elderly obese male  in no acute distress. HEENT: no pallor, no icterus, moist oral mucosa,  Heart: Normal  s1 &s2  Lungs: Clear to auscultation bilaterally. Neuro: Alert, awake, oriented x3,   Lab Results:  Basic Metabolic Panel:    Component Value Date/Time   NA 142 03/03/2013 1237   K 4.2 03/03/2013 1237   CL 106 03/03/2013 1237   CO2 27 03/03/2013 1237   BUN 15 03/03/2013 1237   CREATININE 1.19 03/03/2013 1237   CREATININE 1.00 02/11/2013 1217   GLUCOSE 106* 03/03/2013 1237   CALCIUM 10.0 03/03/2013 1237   CBC:    Component Value Date/Time   WBC 5.4 02/11/2013 1217   HGB 16.5 02/11/2013 1217   HCT 47.1 02/11/2013 1217   PLT 154 02/11/2013 1217   MCV 92.9 02/11/2013 1217   NEUTROABS 2.7 07/06/2010 1050   LYMPHSABS 1.9 07/06/2010 1050   MONOABS 0.5 07/06/2010 1050   EOSABS 0.0 07/06/2010 1050   BASOSABS 0.0 07/06/2010 1050    No results found for this or any previous visit (from the past 240 hour(s)).  Studies/Results: No results found.  Medications: Scheduled Meds: Continuous Infusions: PRN Meds:.    Assessment/Plan:  Uncontrolled hypertension Patient has of his prescriptions and tells me that he does not have $27 to refill his prescription until next week.  Interestingly enough he has been smoking one pack of cigarettes a day and he is able to afford over $100 a month for them.  Prediabetes The patient does not want to start on any medications and would like to try on lifestyle modifications with his diet and try regular exercise.  Dyslipidemia Noted for elevated triglycerides Does not want to start any medications   Followup He may followup as needed once he decides to refill his medications. I have told him that it would be wiser to spend the money on refilling his medications rather than spending 4 times a month on cigarettes   Sunset Joshi 03/18/2013, 12:37 PM

## 2013-04-16 ENCOUNTER — Ambulatory Visit: Payer: Self-pay | Attending: Internal Medicine | Admitting: Internal Medicine

## 2013-04-16 VITALS — BP 149/97 | HR 58 | Temp 97.7°F | Resp 16

## 2013-04-16 DIAGNOSIS — Z79899 Other long term (current) drug therapy: Secondary | ICD-10-CM | POA: Insufficient documentation

## 2013-04-16 DIAGNOSIS — Z23 Encounter for immunization: Secondary | ICD-10-CM

## 2013-04-16 DIAGNOSIS — E1110 Type 2 diabetes mellitus with ketoacidosis without coma: Secondary | ICD-10-CM

## 2013-04-16 DIAGNOSIS — I10 Essential (primary) hypertension: Secondary | ICD-10-CM | POA: Insufficient documentation

## 2013-04-16 DIAGNOSIS — F17200 Nicotine dependence, unspecified, uncomplicated: Secondary | ICD-10-CM | POA: Insufficient documentation

## 2013-04-16 DIAGNOSIS — E1310 Other specified diabetes mellitus with ketoacidosis without coma: Secondary | ICD-10-CM

## 2013-04-16 LAB — GLUCOSE, POCT (MANUAL RESULT ENTRY): POC Glucose: 90 mg/dl (ref 70–99)

## 2013-04-16 MED ORDER — ATENOLOL 50 MG PO TABS: 100.0000 mg | ORAL_TABLET | Freq: Every day | ORAL | Status: DC

## 2013-04-16 NOTE — Progress Notes (Signed)
Patient ID: Steven Delacruz, male   DOB: 1951/03/11, 62 y.o.   MRN: [id number]  HPI Elderly male with uncontrolled hypertension here for followup of his last visit one month back with me. Since his visit he has refilled his prescription and has started to take his medications regularly. He is also codominant smoking to 3-4 cigarettes a day for possibly weeks. He has also been trying to cut down on his salt intake in diet. Denies any headache, blurred vision, dizziness, chest pain, palpitations, shortness of breath.  Vital signs in last 24 hours:  Filed Vitals:   04/16/13 1218  BP: 149/97  Pulse: 58  Temp: 97.7 F (36.5 C)  Resp: 16  SpO2: 100%      Physical Exam:  General: Elderly male in no acute distress. HEENT: no pallor, no icterus, moist oral mucosa, Heart: Normal  s1 &s2  Regular rate and rhythm, without murmurs, rubs, gallops. Lungs: Clear to auscultation bilaterally.    Lab Results:  Basic Metabolic Panel:    Component Value Date/Time   NA 142 03/03/2013 1237   K 4.2 03/03/2013 1237   CL 106 03/03/2013 1237   CO2 27 03/03/2013 1237   BUN 15 03/03/2013 1237   CREATININE 1.19 03/03/2013 1237   CREATININE 1.00 02/11/2013 1217   GLUCOSE 106* 03/03/2013 1237   CALCIUM 10.0 03/03/2013 1237   CBC:    Component Value Date/Time   WBC 5.4 02/11/2013 1217   HGB 16.5 02/11/2013 1217   HCT 47.1 02/11/2013 1217   PLT 154 02/11/2013 1217   MCV 92.9 02/11/2013 1217   NEUTROABS 2.7 07/06/2010 1050   LYMPHSABS 1.9 07/06/2010 1050   MONOABS 0.5 07/06/2010 1050   EOSABS 0.0 07/06/2010 1050   BASOSABS 0.0 07/06/2010 1050    No results found for this or any previous visit (from the past 240 hour(s)).  Studies/Results: No results found.  Medications: Reviewed  Assessment/Plan:  Controlled hypertension Blood pressure is still elevated. Patient encouraged on  reducing his cigarette consumption. Will increase his atenolol dose to100 mg daily. Continue amlodipine and HCTZ.  Follow up in 2 weeks  for BP check  Follow up in 3 months. Will order A1C and lipid panel during that visit   Flu vaccine ordered today   @RRHLOS @  Crystie Yanko 04/16/2013, 12:39 PM

## 2013-04-16 NOTE — Progress Notes (Signed)
Patient here for follow up on DM.

## 2013-05-12 ENCOUNTER — Inpatient Hospital Stay (HOSPITAL_COMMUNITY)
Admission: EM | Admit: 2013-05-12 | Discharge: 2013-05-14 | DRG: 440 | Disposition: A | Discharge status: Home / Self Care | Payer: Medicaid Other | Attending: Internal Medicine | Admitting: Internal Medicine

## 2013-05-12 ENCOUNTER — Encounter (HOSPITAL_COMMUNITY): Payer: Self-pay | Admitting: Emergency Medicine

## 2013-05-12 DIAGNOSIS — I10 Essential (primary) hypertension: Secondary | ICD-10-CM | POA: Diagnosis present

## 2013-05-12 DIAGNOSIS — R932 Abnormal findings on diagnostic imaging of liver and biliary tract: Secondary | ICD-10-CM

## 2013-05-12 DIAGNOSIS — Z79899 Other long term (current) drug therapy: Secondary | ICD-10-CM

## 2013-05-12 DIAGNOSIS — K5730 Diverticulosis of large intestine without perforation or abscess without bleeding: Secondary | ICD-10-CM | POA: Diagnosis present

## 2013-05-12 DIAGNOSIS — G8929 Other chronic pain: Secondary | ICD-10-CM | POA: Diagnosis present

## 2013-05-12 DIAGNOSIS — M545 Low back pain, unspecified: Secondary | ICD-10-CM | POA: Diagnosis present

## 2013-05-12 DIAGNOSIS — I252 Old myocardial infarction: Secondary | ICD-10-CM | POA: Diagnosis present

## 2013-05-12 DIAGNOSIS — R935 Abnormal findings on diagnostic imaging of other abdominal regions, including retroperitoneum: Secondary | ICD-10-CM

## 2013-05-12 DIAGNOSIS — I700 Atherosclerosis of aorta: Secondary | ICD-10-CM | POA: Diagnosis present

## 2013-05-12 DIAGNOSIS — R748 Abnormal levels of other serum enzymes: Secondary | ICD-10-CM

## 2013-05-12 DIAGNOSIS — E781 Pure hyperglyceridemia: Secondary | ICD-10-CM | POA: Diagnosis present

## 2013-05-12 DIAGNOSIS — R079 Chest pain, unspecified: Secondary | ICD-10-CM | POA: Diagnosis present

## 2013-05-12 DIAGNOSIS — Z23 Encounter for immunization: Secondary | ICD-10-CM

## 2013-05-12 DIAGNOSIS — K859 Acute pancreatitis without necrosis or infection, unspecified: Principal | ICD-10-CM | POA: Diagnosis present

## 2013-05-12 DIAGNOSIS — R1084 Generalized abdominal pain: Secondary | ICD-10-CM | POA: Diagnosis present

## 2013-05-12 DIAGNOSIS — F17200 Nicotine dependence, unspecified, uncomplicated: Secondary | ICD-10-CM | POA: Diagnosis present

## 2013-05-12 DIAGNOSIS — E876 Hypokalemia: Secondary | ICD-10-CM | POA: Diagnosis present

## 2013-05-12 HISTORY — DX: Atherosclerosis of aorta: I70.0

## 2013-05-12 HISTORY — DX: Acute myocardial infarction, unspecified: I21.9

## 2013-05-12 HISTORY — DX: Headache: R51

## 2013-05-12 HISTORY — DX: Abnormal findings on diagnostic imaging of liver and biliary tract: R93.2

## 2013-05-12 LAB — CBC WITH DIFFERENTIAL/PLATELET
Basophils Absolute: 0 10*3/uL (ref 0.0–0.1)
Basophils Relative: 0 % (ref 0–1)
Eosinophils Absolute: 0.2 10*3/uL (ref 0.0–0.7)
Eosinophils Relative: 3 % (ref 0–5)
HCT: 47.8 % (ref 39.0–52.0)
Hemoglobin: 15.9 g/dL (ref 13.0–17.0)
Lymphocytes Relative: 38 % (ref 12–46)
Lymphs Abs: 2.4 10*3/uL (ref 0.7–4.0)
MCH: 30.1 pg (ref 26.0–34.0)
MCHC: 33.3 g/dL (ref 30.0–36.0)
MCV: 90.5 fL (ref 78.0–100.0)
Monocytes Absolute: 0.8 10*3/uL (ref 0.1–1.0)
Monocytes Relative: 12 % (ref 3–12)
Neutro Abs: 3.1 10*3/uL (ref 1.7–7.7)
Neutrophils Relative %: 48 % (ref 43–77)
Platelets: 223 10*3/uL (ref 150–400)
RBC: 5.28 MIL/uL (ref 4.22–5.81)
RDW: 14.7 % (ref 11.5–15.5)
WBC: 6.4 10*3/uL (ref 4.0–10.5)

## 2013-05-12 LAB — COMPREHENSIVE METABOLIC PANEL
ALT: 12 U/L (ref 0–53)
AST: 17 U/L (ref 0–37)
Albumin: 3.4 g/dL — ABNORMAL LOW (ref 3.5–5.2)
Alkaline Phosphatase: 82 U/L (ref 39–117)
BUN: 7 mg/dL (ref 6–23)
CO2: 22 mEq/L (ref 19–32)
Calcium: 9.5 mg/dL (ref 8.4–10.5)
Chloride: 101 mEq/L (ref 96–112)
Creatinine, Ser: 0.93 mg/dL (ref 0.50–1.35)
GFR calc Af Amer: >90 mL/min (ref >90)
GFR calc non Af Amer: 89 mL/min — ABNORMAL LOW (ref >90)
Glucose, Bld: 103 mg/dL — ABNORMAL HIGH (ref 70–99)
Potassium: 3.3 mEq/L — ABNORMAL LOW (ref 3.5–5.1)
Sodium: 138 mEq/L (ref 135–145)
Total Bilirubin: 0.3 mg/dL (ref 0.3–1.2)
Total Protein: 7.4 g/dL (ref 6.0–8.3)

## 2013-05-12 LAB — POCT I-STAT TROPONIN I: Troponin i, poc: 0.02 ng/mL (ref 0.00–0.08)

## 2013-05-12 MED ORDER — POTASSIUM CHLORIDE CRYS ER 20 MEQ PO TBCR
40.0000 meq | EXTENDED_RELEASE_TABLET | Freq: Once | ORAL | Status: AC
  Administered 2013-05-13: 40 meq via ORAL
  Filled 2013-05-12: qty 2

## 2013-05-12 MED ORDER — MORPHINE SULFATE 4 MG/ML IJ SOLN
4.0000 mg | Freq: Once | INTRAMUSCULAR | Status: AC
  Administered 2013-05-13: 4 mg via INTRAVENOUS
  Filled 2013-05-12: qty 1

## 2013-05-12 NOTE — ED Notes (Signed)
MD at bedside. 

## 2013-05-12 NOTE — ED Notes (Signed)
Pt c/o left sided abd pain and mid sternal CP x 3 days with some SOB

## 2013-05-13 ENCOUNTER — Encounter (HOSPITAL_COMMUNITY): Payer: Self-pay | Admitting: Radiology

## 2013-05-13 ENCOUNTER — Observation Stay (HOSPITAL_COMMUNITY): Payer: Medicaid Other

## 2013-05-13 ENCOUNTER — Emergency Department (HOSPITAL_COMMUNITY): Payer: Medicaid Other

## 2013-05-13 DIAGNOSIS — K859 Acute pancreatitis without necrosis or infection, unspecified: Principal | ICD-10-CM | POA: Diagnosis present

## 2013-05-13 DIAGNOSIS — R52 Pain, unspecified: Secondary | ICD-10-CM

## 2013-05-13 DIAGNOSIS — R079 Chest pain, unspecified: Secondary | ICD-10-CM | POA: Diagnosis present

## 2013-05-13 DIAGNOSIS — R748 Abnormal levels of other serum enzymes: Secondary | ICD-10-CM

## 2013-05-13 DIAGNOSIS — R932 Abnormal findings on diagnostic imaging of liver and biliary tract: Secondary | ICD-10-CM

## 2013-05-13 DIAGNOSIS — R1084 Generalized abdominal pain: Secondary | ICD-10-CM

## 2013-05-13 DIAGNOSIS — I700 Atherosclerosis of aorta: Secondary | ICD-10-CM

## 2013-05-13 DIAGNOSIS — I10 Essential (primary) hypertension: Secondary | ICD-10-CM

## 2013-05-13 DIAGNOSIS — R935 Abnormal findings on diagnostic imaging of other abdominal regions, including retroperitoneum: Secondary | ICD-10-CM

## 2013-05-13 HISTORY — DX: Abnormal findings on diagnostic imaging of liver and biliary tract: R93.2

## 2013-05-13 HISTORY — DX: Atherosclerosis of aorta: I70.0

## 2013-05-13 LAB — POCT I-STAT TROPONIN I: Troponin i, poc: 0.01 ng/mL (ref 0.00–0.08)

## 2013-05-13 LAB — URINALYSIS, ROUTINE W REFLEX MICROSCOPIC
Glucose, UA: NEGATIVE mg/dL
Hgb urine dipstick: NEGATIVE
Ketones, ur: 15 mg/dL — AB
Nitrite: NEGATIVE
Protein, ur: NEGATIVE mg/dL
Specific Gravity, Urine: 1.027 (ref 1.005–1.030)
Urobilinogen, UA: 1 mg/dL (ref 0.0–1.0)
pH: 5 (ref 5.0–8.0)

## 2013-05-13 LAB — URINE MICROSCOPIC-ADD ON

## 2013-05-13 LAB — LIPASE, BLOOD: Lipase: 133 U/L — ABNORMAL HIGH (ref 11–59)

## 2013-05-13 MED ORDER — HYDROCODONE-ACETAMINOPHEN 5-325 MG PO TABS: 1.0000 | ORAL_TABLET | ORAL | Status: DC | PRN

## 2013-05-13 MED ORDER — MORPHINE SULFATE 2 MG/ML IJ SOLN: 2.0000 mg | INTRAMUSCULAR | Status: DC | PRN

## 2013-05-13 MED ORDER — PNEUMOCOCCAL VAC POLYVALENT 25 MCG/0.5ML IJ INJ
0.5000 mL | INJECTION | INTRAMUSCULAR | Status: AC
  Administered 2013-05-14: 0.5 mL via INTRAMUSCULAR
  Filled 2013-05-13: qty 0.5

## 2013-05-13 MED ORDER — SODIUM CHLORIDE 0.9 % IV SOLN
INTRAVENOUS | Status: DC
  Administered 2013-05-13 – 2013-05-14 (×2): via INTRAVENOUS

## 2013-05-13 MED ORDER — GADOBENATE DIMEGLUMINE 529 MG/ML IV SOLN
20.0000 mL | Freq: Once | INTRAVENOUS | Status: AC | PRN
  Administered 2013-05-13: 20 mL via INTRAVENOUS

## 2013-05-13 MED ORDER — ONDANSETRON HCL 4 MG PO TABS: 4.0000 mg | ORAL_TABLET | Freq: Four times a day (QID) | ORAL | Status: DC | PRN

## 2013-05-13 MED ORDER — IOHEXOL 300 MG/ML  SOLN
100.0000 mL | Freq: Once | INTRAMUSCULAR | Status: AC | PRN
  Administered 2013-05-13: 100 mL via INTRAVENOUS

## 2013-05-13 MED ORDER — ONDANSETRON HCL 4 MG/2ML IJ SOLN: 4.0000 mg | Freq: Four times a day (QID) | INTRAMUSCULAR | Status: DC | PRN

## 2013-05-13 MED ORDER — MORPHINE SULFATE 4 MG/ML IJ SOLN
4.0000 mg | Freq: Once | INTRAMUSCULAR | Status: AC
  Administered 2013-05-13: 4 mg via INTRAVENOUS
  Filled 2013-05-13: qty 1

## 2013-05-13 MED ORDER — IOHEXOL 300 MG/ML  SOLN
25.0000 mL | Freq: Once | INTRAMUSCULAR | Status: AC | PRN
  Administered 2013-05-13: 25 mL via ORAL

## 2013-05-13 MED ORDER — GI COCKTAIL ~~LOC~~
30.0000 mL | Freq: Once | ORAL | Status: AC
  Administered 2013-05-13: 30 mL via ORAL
  Filled 2013-05-13: qty 30

## 2013-05-13 MED ORDER — PANTOPRAZOLE SODIUM 40 MG IV SOLR
40.0000 mg | Freq: Once | INTRAVENOUS | Status: AC
  Administered 2013-05-13: 40 mg via INTRAVENOUS
  Filled 2013-05-13: qty 40

## 2013-05-13 NOTE — H&P (Signed)
[facility] History and Physical  [name] DOB: Apr 25, 1951 DOA: 05/12/2013  Referring physician: Benard Halsted, ER physician PCP: Jeanann Lewandowsky, MD  Specialists: [facility]  Chief Complaint: Abdominal and chest pain  HPI: Steven Delacruz is a 62 y.o. male  Past medical history of obesity and hypertension who has for the last 4 days been having complaints of abdominal pain which she describes as mostly generalized of the symptoms he feels it is more on the left-hand side upper and lower quadrants. There is been sometimes associated nausea and vomiting. The patient cannot always associate this with food. He came into the emergency room when his symptoms started to get worse and he felt like he could not eat anything. In the emergency room, a CT scan of the abdomen and pelvis noted a small incidental lesion in the tail the pancreas as well as some inflammation around the curvature of the stomach consistent with possible gastric inflammation. Lab-wise he was noted to have some, mildly elevated lipase levels and a mild hypokalemia, consistent with his vomiting. Rest of his lab work was essentially unremarkable. Patient was admitted to the hospitalist service and gastroenterology was consulted who plans to do an endoscopy. Baseline, the patient takes 2 aspirin daily for health reasons. Up until recently he was taking two 325 mg tablets and recently decreased to two 81 mg tablets.  The patient has also been complaining of some chest pain for the past 4 weeks, when asked to describe this, he describes it as mid epigastric radiating up to the top of his chest and described as burning with non-radiation or shortness of breath  Review of Systems: Patient was seen after arrival to the floor. He is feeling better. Denies any headache or vision changes or dysphagia. Denies any palpitations or shortness of breath. His abdominal pain is little bit better right now. He  denies any hematuria or dysuria. No blood in stool or black tarry stools. Denies any constipation or diarrhea. Denies any focal extremity numbness or weakness or pain. His review of systems otherwise negative.  Past Medical History  Diagnosis Date  . Hypertension   . Chronic back pain   . Right shoulder pain   . Myocardial infarction   . [id number](324.4)    Past Surgical History  Procedure Laterality Date  . No past surgeries     Social History:  reports that he has been smoking Cigarettes.  He has been smoking about 0.25 packs per day. He has never used smokeless tobacco. He reports that he drinks about 2.4 ounces of alcohol per week. He reports that he does not use illicit drugs. Patient was at home by himself with family nearby. He underwent well using a cane  Allergies  Allergen Reactions  . Lisinopril Swelling    Family history: Patient states he cannot think of any medical problems that run in his family  Prior to Admission medications   Medication Sig Start Date End Date Taking? Authorizing Provider  amLODipine (NORVASC) 10 MG tablet Take 1 tablet (10 mg total) by mouth daily. 02/11/13  Yes Tommie Sams, DO  atenolol (TENORMIN) 50 MG tablet Take 2 tablets (100 mg total) by mouth daily. 04/16/13  Yes Nishant Dhungel, MD  fish oil-omega-3 fatty acids 1000 MG capsule Take 1 g by mouth daily.    Yes Historical Provider, MD  hydrochlorothiazide (HYDRODIURIL) 25 MG tablet Take 1 tablet (25 mg total) by mouth daily. 02/11/13  Yes Tommie Sams, DO  Misc  Natural Products (COLON CLEANSER PO) Take 1 tablet by mouth daily.    Yes Historical Provider, MD  naproxen sodium (ANAPROX) 220 MG tablet Take 440 mg by mouth daily as needed (for pain).   Yes Historical Provider, MD   Physical Exam: Filed Vitals:   05/13/13 0509  BP: 125/75  Pulse: 60  Temp: 97.5 F (36.4 C)  Resp: 17     General:  Alert and oriented x3, in no acute distress  Eyes: Sclera nonicteric, extraocular movements  are intact  ENT: Normocephalic, atraumatic, mucous membranes are moist   Neck:  Supple, no JVD  Cardiovascular: Regular rate and rhythm, S1-S2  Respiratory: Clear to auscultation bilaterally  Abdomen: Soft, obese, nontender, normal active bowel sounds  Skin: No skin breaks, tears or lesions  Musculoskeletal: No clubbing or cyanosis, trace pitting edema  Psychiatric: Patient is appropriate, no evidence of psychoses  Neurologic: No focal deficits  Labs on Admission:  Basic Metabolic Panel:  Recent Labs Lab 05/12/13 1805  NA 138  K 3.3*  CL 101  CO2 22  GLUCOSE 103*  BUN 7  CREATININE 0.93  CALCIUM 9.5   Liver Function Tests:  Recent Labs Lab 05/12/13 1805  AST 17  ALT 12  ALKPHOS 82  BILITOT 0.3  PROT 7.4  ALBUMIN 3.4*    Recent Labs Lab 05/12/13 1805  LIPASE 133*   No results found for this basename: AMMONIA,  in the last 168 hours CBC:  Recent Labs Lab 05/12/13 1805  WBC 6.4  NEUTROABS 3.1  HGB 15.9  HCT 47.8  MCV 90.5  PLT 223    Radiological Exams on Admission: Ct Abdomen Pelvis W Contrast  05/13/2013   CLINICAL DATA:  Left-sided abdominal pain and midsternal chest pain; shortness of breath.  EXAM: CT ABDOMEN AND PELVIS WITH CONTRAST  TECHNIQUE: Multidetector CT imaging of the abdomen and pelvis was performed using the standard protocol following bolus administration of intravenous contrast.  CONTRAST:  OMNIPAQUE IOHEXOL 300 MG/ML  SOLN  COMPARISON:  CT of the abdomen and pelvis performed 07/07/2010  FINDINGS: The visualized lung bases are clear.  There is soft tissue inflammation and fluid tracking about the body of the stomach, with apparent wall thickening and an unusual enhancement pattern to the gastric mucosa. This may reflect acute gastritis, or possibly a poorly characterized underlying ulceration. Fluid is noted tracking inferiorly to the level of the upper pole of the left kidney. There is no definite evidence of perforation or  abscess formation.  The liver and spleen are unremarkable in appearance. The gallbladder is within normal limits. There is a vague poorly characterized 2.3 x 1.4 cm hypodensity within the tail of the pancreas, of uncertain significance. MRCP could be considered for further evaluation. The pancreas is otherwise unremarkable in appearance. The adrenal glands are grossly unremarkable.  The kidneys are unremarkable in appearance. There is no evidence of hydronephrosis. No renal or ureteral stones are seen. No perinephric stranding is appreciated.  No free fluid is identified. The small bowel is unremarkable in appearance. The stomach is within normal limits. No acute vascular abnormalities are seen. Scattered calcification is noted along the distal abdominal aorta and its branches.  The appendix is normal in caliber and contains air, without evidence for appendicitis. Diverticulosis is noted along the entirety of the colon, with partial sparing of the sigmoid colon. There is no evidence of diverticulitis.  The bladder is mildly distended and grossly unremarkable in appearance. The prostate remains normal in size,  with scattered calcification. No inguinal lymphadenopathy is seen.  No acute osseous abnormalities are identified.  IMPRESSION: 1. Soft tissue inflammation and fluid tracking about the body of the stomach, with apparent wall thickening and an unusual enhancement pattern to the gastric mucosa. Fluid tracks inferiorly to the level of the upper pole of the left kidney. This may reflect acute gastritis, or possibly a poorly characterized underlying gastric ulcer. No definite evidence of perforation or abscess formation at this time. 2. Vague poorly characterized 2.3 x 1.4 cm hypodensity within the tail of the pancreas, of uncertain significance. MRCP would be helpful for further evaluation, when and as deemed clinically appropriate (when the patient is able to hold his breath for the study). 3. Scattered  calcification along the distal abdominal aorta and its branches. 4. Diverticulosis along the entirety of the colon, with partial sparing of the sigmoid colon.   Electronically Signed   By: Roanna Raider M.D.   On: 05/13/2013 02:31    EKG: Independently reviewed. Normal sinus rhythm with nonspecific ST variations  Assessment/Plan Principal Problem:   Abdominal pain, acute, generalized: Suspect gastritis with possible early ulceration secondary to NSAIDs. Gastroenterology to see with likely EGD coming. Keep patient n.p.o. IV PPI. Active Problems:   TOBACCO ABUSE: Patient counseled. He declined a nicotine patch   HYPERTENSION: Holding oral antihypertensives until patient is able take by mouth   DIVERTICULOSIS, COLON: Incidental finding noted on CT. Currently not acute   Hypertriglyceridemia   Abnormal CT of the abdomen: See below   Elevated lipase level: Symptoms not necessarily consistent with pancreatitis. Given? Lesion seen on CT in pancreas, certainly concerning for malignancy. Check CEA 19-9 level Chest pain: Not cardiac. Atypical and given history, strongly suspicious for severe GERD secondary to gastritis    Code Status: Full code  Family Communication: Patient declined for me to call in members of his family. He said he would keep his brother informed Disposition Plan: To be determined after endoscopy. Likely home within the next 24 hours  Time spent: 25 min  Hollice Espy [facility] Pager [phone]  If 7PM-7AM, please contact night-coverage [email or web address] Password [facility] 05/13/2013, 7:28 AM

## 2013-05-13 NOTE — Progress Notes (Signed)
Received patient from ED,admitted with c/o chest and upper epigastric pain.Patient is alert and oriented x4.Patient pain free at this time.Ambulates with use of cane.Oriented to room and unit routines.Advised to call for any assistance.Call bell within reach.Notified flow manager that patient is on the unit and will need admission orders. Jenniffer Vessels Joselita,RN

## 2013-05-13 NOTE — ED Provider Notes (Signed)
CSN: [id number]     Arrival date & time 05/12/13  1751 History   First MD Initiated Contact with Patient 05/12/13 2312     Chief Complaint  Patient presents with  . Chest Pain  . Abdominal Pain   (Consider location/radiation/quality/duration/timing/severity/associated sxs/prior Treatment) HPI Comments: 62 yo male with smoking, htn, colon polyps present with abd and cp.  Left mid/ lower abd pain for 3 days, non radiating, ache, no hx of similar, no blood in stools.  Also intermittent epig/ lower chest burning, worse with belching/ coughing, non exertional, no diaphoresis, no cardiac hx. Smoker.  No recent surgeries. No hx of diverticular.  Intermittent sxs.    Patient is a 62 y.o. male presenting with chest pain and abdominal pain. The history is provided by the patient.  Chest Pain Associated symptoms: abdominal pain   Associated symptoms: no back pain, no diaphoresis, no fever, no headache, no shortness of breath and not vomiting   Abdominal Pain Associated symptoms: chest pain   Associated symptoms: no chills, no dysuria, no fever, no shortness of breath and no vomiting     Past Medical History  Diagnosis Date  . Hypertension   . Chronic back pain   . Right shoulder pain    History reviewed. No pertinent past surgical history. History reviewed. No pertinent family history. History  Substance Use Topics  . Smoking status: Current Every Day Smoker -- 1.00 packs/day    Types: Cigarettes  . Smokeless tobacco: Not on file  . Alcohol Use: Yes    Review of Systems  Constitutional: Negative for fever, chills and diaphoresis.  HENT: Negative for congestion.   Eyes: Negative for visual disturbance.  Respiratory: Negative for shortness of breath.   Cardiovascular: Positive for chest pain.  Gastrointestinal: Positive for abdominal pain. Negative for vomiting and blood in stool.  Genitourinary: Negative for dysuria and flank pain.  Musculoskeletal: Negative for back pain, neck pain  and neck stiffness.  Skin: Negative for rash.  Neurological: Negative for light-headedness and headaches.    Allergies  Lisinopril  Home Medications   Current Outpatient Rx  Name  Route  Sig  Dispense  Refill  . amLODipine (NORVASC) 10 MG tablet   Oral   Take 1 tablet (10 mg total) by mouth daily.   30 tablet   6   . atenolol (TENORMIN) 50 MG tablet   Oral   Take 2 tablets (100 mg total) by mouth daily.   60 tablet   5   . fish oil-omega-3 fatty acids 1000 MG capsule   Oral   Take 1 g by mouth daily.          . hydrochlorothiazide (HYDRODIURIL) 25 MG tablet   Oral   Take 1 tablet (25 mg total) by mouth daily.   30 tablet   6   . Misc Natural Products (COLON CLEANSER PO)   Oral   Take 1 tablet by mouth daily.          . naproxen sodium (ANAPROX) 220 MG tablet   Oral   Take 440 mg by mouth daily as needed (for pain).          BP 139/75  Pulse 60  Temp(Src) 98.7 F (37.1 C) (Oral)  Resp 14  SpO2 97% Physical Exam  Nursing note and vitals reviewed. Constitutional: He is oriented to person, place, and time. He appears well-developed and well-nourished.  HENT:  Head: Normocephalic and atraumatic.  Eyes: Conjunctivae are normal. Right eye exhibits  no discharge. Left eye exhibits no discharge.  Neck: Normal range of motion. Neck supple. No tracheal deviation present.  Cardiovascular: Normal rate, regular rhythm and intact distal pulses.   Pulmonary/Chest: Effort normal and breath sounds normal.  Abdominal: Soft. He exhibits no distension. There is tenderness (LLQ). There is no guarding.  Musculoskeletal: He exhibits no edema and no tenderness.  Neurological: He is alert and oriented to person, place, and time.  Skin: Skin is warm. No rash noted.  Psychiatric: He has a normal mood and affect.    ED Course  Procedures (including critical care time) Labs Review Labs Reviewed  COMPREHENSIVE METABOLIC PANEL - Abnormal; Notable for the following:     Potassium 3.3 (*)    Glucose, Bld 103 (*)    Albumin 3.4 (*)    GFR calc non Af Amer 89 (*)    All other components within normal limits  URINALYSIS, ROUTINE W REFLEX MICROSCOPIC - Abnormal; Notable for the following:    Color, Urine AMBER (*)    APPearance CLOUDY (*)    Bilirubin Urine SMALL (*)    Ketones, ur 15 (*)    Leukocytes, UA SMALL (*)    All other components within normal limits  LIPASE, BLOOD - Abnormal; Notable for the following:    Lipase 133 (*)    All other components within normal limits  URINE CULTURE  CBC WITH DIFFERENTIAL  URINE MICROSCOPIC-ADD ON  POCT I-STAT TROPONIN I  POCT I-STAT TROPONIN I   Imaging Review Ct Abdomen Pelvis W Contrast  05/13/2013   CLINICAL DATA:  Left-sided abdominal pain and midsternal chest pain; shortness of breath.  EXAM: CT ABDOMEN AND PELVIS WITH CONTRAST  TECHNIQUE: Multidetector CT imaging of the abdomen and pelvis was performed using the standard protocol following bolus administration of intravenous contrast.  CONTRAST:  OMNIPAQUE IOHEXOL 300 MG/ML  SOLN  COMPARISON:  CT of the abdomen and pelvis performed 07/07/2010  FINDINGS: The visualized lung bases are clear.  There is soft tissue inflammation and fluid tracking about the body of the stomach, with apparent wall thickening and an unusual enhancement pattern to the gastric mucosa. This may reflect acute gastritis, or possibly a poorly characterized underlying ulceration. Fluid is noted tracking inferiorly to the level of the upper pole of the left kidney. There is no definite evidence of perforation or abscess formation.  The liver and spleen are unremarkable in appearance. The gallbladder is within normal limits. There is a vague poorly characterized 2.3 x 1.4 cm hypodensity within the tail of the pancreas, of uncertain significance. MRCP could be considered for further evaluation. The pancreas is otherwise unremarkable in appearance. The adrenal glands are grossly unremarkable.   The kidneys are unremarkable in appearance. There is no evidence of hydronephrosis. No renal or ureteral stones are seen. No perinephric stranding is appreciated.  No free fluid is identified. The small bowel is unremarkable in appearance. The stomach is within normal limits. No acute vascular abnormalities are seen. Scattered calcification is noted along the distal abdominal aorta and its branches.  The appendix is normal in caliber and contains air, without evidence for appendicitis. Diverticulosis is noted along the entirety of the colon, with partial sparing of the sigmoid colon. There is no evidence of diverticulitis.  The bladder is mildly distended and grossly unremarkable in appearance. The prostate remains normal in size, with scattered calcification. No inguinal lymphadenopathy is seen.  No acute osseous abnormalities are identified.  IMPRESSION: 1. Soft tissue inflammation and fluid  tracking about the body of the stomach, with apparent wall thickening and an unusual enhancement pattern to the gastric mucosa. Fluid tracks inferiorly to the level of the upper pole of the left kidney. This may reflect acute gastritis, or possibly a poorly characterized underlying gastric ulcer. No definite evidence of perforation or abscess formation at this time. 2. Vague poorly characterized 2.3 x 1.4 cm hypodensity within the tail of the pancreas, of uncertain significance. MRCP would be helpful for further evaluation, when and as deemed clinically appropriate (when the patient is able to hold his breath for the study). 3. Scattered calcification along the distal abdominal aorta and its branches. 4. Diverticulosis along the entirety of the colon, with partial sparing of the sigmoid colon.   Electronically Signed   By: Roanna Raider M.D.   On: 05/13/2013 02:31    EKG Interpretation     Ventricular Rate:  69 PR Interval:  168 QRS Duration: 92 QT Interval:  396 QTC Calculation: 424 R Axis:   37 Text  Interpretation:  Normal sinus rhythm Nonspecific ST abnormality Abnormal ECG similar to previous            MDM  No diagnosis found. Chest pain more epig, screening cardiac with age/ risk factors however likely related to abd. Tender on exam.  CT for diverticular or other pathology.  Pain meds, blood work. Protonix given.  Pain improved on recheck. CT shows likley ulcer with fluid surrounding. Spoke with Dr Julian Reil and GI, GI recommended admission and EGD.  No acute bleeding in ED, vitals stable.  The patients results and plan were reviewed and discussed.   Any x-rays performed were personally reviewed by myself.   Differential diagnosis were considered with the presenting HPI.  Diagnosis: Diverticulosis, Chest pain, Epig pain, Gastric ulcer  EKG: no acute findings  Admission/ observation were discussed with the admitting physician, patient and/or family and they are comfortable with the plan.    Enid Skeens, MD 05/13/13 [phone]

## 2013-05-13 NOTE — Consult Note (Signed)
[facility] Consult: 9:41 AM 05/13/2013  LOS: 1 day   Referring Provider:  Dr Gonzella Lex Primary Care Physician:  Jeanann Lewandowsky, MD  Of [facility] Primary Gastroenterologist:  unassigned     Reason for Consultation:  Epigastric pain and abnormal CT   HPI: Steven Delacruz is a 62 y.o. male.  Several weeks of non-exertional midsternal to epigastric pain, got worse and persistent in last few days.  No nausea, no vomiting, no fevers, no SOB, no sweats.  Pain penetrates into mid back, separate from chronic low back pain.  No anorexia or weight loss.  + 1 to 2 inch increase abdominal girth. Uses 2 Aleve daily 3 to 4 days per week, this relieves the pain.  Up to 80 ox beer per day.    No previous EGD, recalls colonoscopy in [place] about 6 years ago but no records in [place].  No polyps or pathology per pt recall.  LFTs normal, Lipase 133. CT with hypodensity in pancreatic tail and perigastric inflammation. Incidental diverticulosis.      Past Medical History  Diagnosis Date  . Hypertension   . Chronic back pain   . Right shoulder pain   . Myocardial infarction   . Headache(784.0)        ETOH and substance abuse.   Past Surgical History  Procedure Laterality Date  . No past surgeries      Prior to Admission medications   Medication Sig Start Date End Date Taking? Authorizing Provider  amLODipine (NORVASC) 10 MG tablet Take 1 tablet (10 mg total) by mouth daily. 02/11/13  Yes Tommie Sams, DO  atenolol (TENORMIN) 50 MG tablet Take 2 tablets (100 mg total) by mouth daily. 04/16/13  Yes Nishant Dhungel, MD  fish oil-omega-3 fatty acids 1000 MG capsule Take 1 g by mouth daily.    Yes Historical Provider, MD  hydrochlorothiazide (HYDRODIURIL) 25 MG tablet Take 1 tablet (25 mg total) by mouth daily. 02/11/13  Yes Tommie Sams, DO  Misc Natural Products (COLON CLEANSER PO) Take 1 tablet by mouth daily.    Yes Historical Provider, MD  OTC Aleve 2  per day.  3 to 4 days per week.         Scheduled Meds: . [START ON 05/14/2013] pneumococcal 23 valent vaccine  0.5 mL Intramuscular Tomorrow-1000   Infusions: . sodium chloride     PRN Meds: morphine injection, ondansetron (ZOFRAN) IV, ondansetron   Allergies as of 05/12/2013 - Review Complete 05/12/2013  Allergen Reaction Noted  . Lisinopril Swelling 12/10/2012    Family Hx No colon cancer or ulcer disease  History   Social History  . Marital Status: Divorced    Spouse Name: N/A    Number of Children: N/A  . Years of Education: N/A   Occupational History  . Long term disability due to back/spine issues.    Social History Main Topics  . Smoking status: Current Every Day Smoker -- 0.25 packs/day. Through oct 2014 was at 1ppd.     Types: Cigarettes  . Smokeless tobacco: Never Used  . Alcohol Use:     40 to 80 oz beer per day.   . Drug Use: No  . Sexual Activity: Yes   Other Topics Concern  . Not on file   Social History Narrative  . Lives alone    REVIEW OF SYSTEMS: Constitutional:  No weight loss ENT:  No nose bleeds Pulm:  No cough.  Stable DOE with walking more than a few blocke  CV:  No palpitation or edema in feet GU:  No dysuria, no hematuria. MS:  Chronic low back pain, exacerbated with walking.  GI:  No dysphagia. Heme:  No hx anemia.    Transfusions:  none Neuro:  No headaches, no syncope,  Some gait unsteadiness Derm:  No rash, sores, or itching Endocrine:  No excessive thirst or urination Immunization:  Up to date on 2014/15 flu shot Travel: none   PHYSICAL EXAM: Vital signs in last 24 hours: Filed Vitals:   05/13/13 0509  BP: 125/75  Pulse: 60  Temp: 97.5 F (36.4 C)  Resp: 17   Wt Readings from Last 3 Encounters:  05/13/13 102.3 kg (225 lb 8.5 oz)  03/18/13 105.597 kg (232 lb 12.8 oz)  03/03/13 103.42 kg (228 lb)   General: looks well.  comfortable Head:  No trauma or asymmetry  Eyes:  No icterus or pallor Ears:  Not HOH   Nose:  No discharge or congestion Mouth:  Clear, moist oral MM.  Very few teeth Neck:  No JVD, No TMG, no mass Lungs:  Clear bil.  No dyspnea or cough Heart: RRR.  No MRG Abdomen:  Soft, NT, protuberant, active BS.  No succusion splash, no HSM or bruit Rectal: not done   Musc/Skeltl: no joint deformity or swelling Extremities:  No pedal edema, 2 to 3 plus pedal pulses  Neurologic:  Oriented x 3.  No tremor .  No limb weakness Skin:  No rash or sores.   Tattoos:  none Nodes:  No cervical adenaopathy   Psych:  Pleasant, relaxed, appropriate.   Intake/Output from previous day:   Intake/Output this shift:    LAB RESULTS:  Recent Labs  05/12/13 1805  WBC 6.4  HGB 15.9  HCT 47.8  PLT 223   BMET Lab Results  Component Value Date   NA 138 05/12/2013   NA 142 03/03/2013   NA 141 02/11/2013   K 3.3* 05/12/2013   K 4.2 03/03/2013   K 3.2* 02/11/2013   CL 101 05/12/2013   CL 106 03/03/2013   CL 105 02/11/2013   CO2 22 05/12/2013   CO2 27 03/03/2013   CO2 23 02/11/2013   GLUCOSE 103* 05/12/2013   GLUCOSE 106* 03/03/2013   GLUCOSE 114* 02/11/2013   BUN 7 05/12/2013   BUN 15 03/03/2013   BUN 7 02/11/2013   CREATININE 0.93 05/12/2013   CREATININE 1.19 03/03/2013   CREATININE 1.00 02/11/2013   CALCIUM 9.5 05/12/2013   CALCIUM 10.0 03/03/2013   CALCIUM 9.7 02/11/2013   LFT  Recent Labs  05/12/13 1805  PROT 7.4  ALBUMIN 3.4*  AST 17  ALT 12  ALKPHOS 82  BILITOT 0.3   Lipase     Component Value Date/Time   LIPASE 133* 05/12/2013 1805    PT/INR Lab Results  Component Value Date   INR 1.10 07/06/2010    RADIOLOGY STUDIES: Ct Abdomen Pelvis W Contrast 05/13/2013    FINDINGS: The visualized lung bases are clear.  There is soft tissue inflammation and fluid tracking about the body of the stomach, with apparent wall thickening and an unusual enhancement pattern to the gastric mucosa. This may reflect acute gastritis, or possibly a poorly characterized underlying ulceration. Fluid is  noted tracking inferiorly to the level of the upper pole of the left kidney. There is no definite evidence of perforation or abscess formation.  The liver and spleen are unremarkable in appearance. The gallbladder is within normal limits. There is a  vague poorly characterized 2.3 x 1.4 cm hypodensity within the tail of the pancreas, of uncertain significance. MRCP could be considered for further evaluation. The pancreas is otherwise unremarkable in appearance. The adrenal glands are grossly unremarkable.  The kidneys are unremarkable in appearance. There is no evidence of hydronephrosis. No renal or ureteral stones are seen. No perinephric stranding is appreciated.  No free fluid is identified. The small bowel is unremarkable in appearance. The stomach is within normal limits. No acute vascular abnormalities are seen. Scattered calcification is noted along the distal abdominal aorta and its branches.  The appendix is normal in caliber and contains air, without evidence for appendicitis. Diverticulosis is noted along the entirety of the colon, with partial sparing of the sigmoid colon. There is no evidence of diverticulitis.  The bladder is mildly distended and grossly unremarkable in appearance. The prostate remains normal in size, with scattered calcification. No inguinal lymphadenopathy is seen.  No acute osseous abnormalities are identified.  IMPRESSION: 1. Soft tissue inflammation and fluid tracking about the body of the stomach, with apparent wall thickening and an unusual enhancement pattern to the gastric mucosa. Fluid tracks inferiorly to the level of the upper pole of the left kidney. This may reflect acute gastritis, or possibly a poorly characterized underlying gastric ulcer. No definite evidence of perforation or abscess formation at this time. 2. Vague poorly characterized 2.3 x 1.4 cm hypodensity within the tail of the pancreas, of uncertain significance. MRCP would be helpful for further evaluation,  when and as deemed clinically appropriate (when the patient is able to hold his breath for the study). 3. Scattered calcification along the distal abdominal aorta and its branches. 4. Diverticulosis along the entirety of the colon, with partial sparing of the sigmoid colon.   Electronically Signed   By: Roanna Raider M.D.   On: 05/13/2013 02:31    ENDOSCOPIC STUDIES: Colonoscopy but no records in [facility]. Sounds like screening study and pt reports no polyps found, no follow up letters sent.    IMPRESSION:   *  Epigastric, midsternal pain.  Worsened in last few days.  Perigastric inflammatory changes and tail of pancreas density.   *  ETOH abuse. Normal LFTs  *  Hypokalemia.    PLAN:      *  Dr Leone Payor reviewed case and images and suggests MRI/MRCP so will order    Jennye Moccasin  05/13/2013, 9:41 AM Pager: [phone]  [facility] Attending  I have also seen and assessed the patient and agree with the above note. MR/MRCP shows changes most consistent with focal pancreatitis in the tail though underling tumor not excluded. + ascites (small) Will allow clears and then low fat in AM. No EtOH. If ok home 11/14 on oral narcotics and f/u NP or PA in my office in 1-2 weeks to make sure he is doing ok with f/u me after that. He will need a screening colonoscopy at some point unless this pancreatic problem is cancer (seems unlikely). I told him this could be a cancer and very important to f/u on it.  Iva Boop, MD, [facility] [phone] (pager) 05/13/2013 6:20 PM

## 2013-05-14 DIAGNOSIS — F17200 Nicotine dependence, unspecified, uncomplicated: Secondary | ICD-10-CM

## 2013-05-14 DIAGNOSIS — K859 Acute pancreatitis without necrosis or infection, unspecified: Principal | ICD-10-CM

## 2013-05-14 DIAGNOSIS — E781 Pure hyperglyceridemia: Secondary | ICD-10-CM

## 2013-05-14 LAB — CBC
HCT: 45.1 % (ref 39.0–52.0)
Hemoglobin: 15.5 g/dL (ref 13.0–17.0)
MCH: 31.8 pg (ref 26.0–34.0)
MCHC: 34.4 g/dL (ref 30.0–36.0)
MCV: 92.4 fL (ref 78.0–100.0)
Platelets: 222 10*3/uL (ref 150–400)
RBC: 4.88 MIL/uL (ref 4.22–5.81)
RDW: 14.5 % (ref 11.5–15.5)
WBC: 4.6 10*3/uL (ref 4.0–10.5)

## 2013-05-14 LAB — URINE CULTURE: Culture: NO GROWTH

## 2013-05-14 LAB — LIPASE, BLOOD: Lipase: 97 U/L — ABNORMAL HIGH (ref 11–59)

## 2013-05-14 LAB — LIPID PANEL
Cholesterol: 141 mg/dL (ref 0–200)
HDL: 50 mg/dL (ref >39)
LDL Cholesterol: 53 mg/dL (ref 0–99)
Total CHOL/HDL Ratio: 2.8 RATIO
Triglycerides: 190 mg/dL — ABNORMAL HIGH (ref <150)
VLDL: 38 mg/dL (ref 0–40)

## 2013-05-14 LAB — BASIC METABOLIC PANEL
BUN: 7 mg/dL (ref 6–23)
CO2: 23 mEq/L (ref 19–32)
Calcium: 8.8 mg/dL (ref 8.4–10.5)
Chloride: 104 mEq/L (ref 96–112)
Creatinine, Ser: 0.97 mg/dL (ref 0.50–1.35)
GFR calc Af Amer: >90 mL/min (ref >90)
GFR calc non Af Amer: 88 mL/min — ABNORMAL LOW (ref >90)
Glucose, Bld: 99 mg/dL (ref 70–99)
Potassium: 3.4 mEq/L — ABNORMAL LOW (ref 3.5–5.1)
Sodium: 139 mEq/L (ref 135–145)

## 2013-05-14 MED ORDER — POTASSIUM CHLORIDE CRYS ER 20 MEQ PO TBCR
40.0000 meq | EXTENDED_RELEASE_TABLET | Freq: Once | ORAL | Status: AC
  Administered 2013-05-14: 40 meq via ORAL
  Filled 2013-05-14: qty 2

## 2013-05-14 MED ORDER — HYDROCODONE-ACETAMINOPHEN 5-325 MG PO TABS: 1.0000 | ORAL_TABLET | ORAL | Status: DC | PRN

## 2013-05-14 NOTE — Discharge Summary (Signed)
Physician Discharge Summary  Steven Delacruz YQM:[id number] DOB: 12-Jul-1950 DOA: 05/12/2013  PCP: Jeanann Lewandowsky, MD  Admit date: 05/12/2013 Discharge date: 05/14/2013  Time spent: 25 minutes  Recommendations for Outpatient Follow-up:  1. Patient is being advised to stop drinking altogether 2. Patient will discontinue his 2 daily aspirin 3. Patient will follow with gastroenterology in 2 weeks.  Discharge Diagnoses:  Principal Problem:   Acute pancreatitis Active Problems:   TOBACCO ABUSE   HYPERTENSION   DIVERTICULOSIS, COLON   Hypertriglyceridemia   Abnormal CT scan, pancreas - ? focal pancreatitis    Chest pain, unspecified   Abdominal aortic atherosclerosis   Discharge Condition: improved, being discharged home  Diet recommendation: heart healthy  Filed Weights   05/13/13 0509 05/13/13 2114  Weight: 102.3 kg (225 lb 8.5 oz) 103.103 kg (227 lb 4.8 oz)    History of present illness:  62 year old African American male with past oral history of obesity, hypertension and frequent beer consumption who presented with 4 days of generalized abdominal pain associated with some nausea and vomiting. In the emergency room on 11/13, CT scan noted small incidental lesion in tail of pancreas as well as some inflammation around the curvature of the stomach consistent with gastric inflammation and a mildly elevated lipase level. Patient was admitted to the hospital service  Hospital Course:  Principal Problem:   Acute pancreatitis: Gastric urology was consulted initially recommended MRCP which confirmed actually that patient had acute pancreatitis and inflammation around stomach is related more to the pancreas. He initially was made n.p.o. And then diet was advanced by the next day. Lipase level had decreased by 11/14 and patient was able tolerate solid food. Recommendation is for patient to discontinue drinking altogether as this was felt to be the cause of his pancreatitis. No signs  of any gallstone related issues and patient's triglycerides were minimally elevated but not strong enough to consider this a source of pancreatitis Active Problems:   TOBACCO ABUSE: Counseled. Patient declined a nicotine patch during hospitalization   HYPERTENSION: Stable. Continued on home meds   DIVERTICULOSIS, COLON   Hypertriglyceridemia: Again, elevated, but not strong enough to cause pancreatitis. Recommend Lopid but will defer this to his PCP   Abnormal CT scan, pancreas - ? focal pancreatitis    Chest pain, unspecified: Patient initially was complaining of some chest pain but with further examination and discussion as it to be more related to gastritis and GERD. Patient has been taking daily aspirin initially 325 speech and then recently decreased to 81. He did not have any documented heart issues, but does have some aortic calcifications noted incidentally on CT. Would recommend discontinuation of his aspirin and At this time would recommend control of lipids especially with likely NSAID gastritis/GERD.   Abdominal aortic atherosclerosis   Procedures:  None  Consultations:  [place] [facility]  Discharge Exam: Filed Vitals:   05/14/13 0509  BP: 160/82  Pulse: 63  Temp: 97.8 F (36.6 C)  Resp: 20    General: alert and oriented x3, no acute distress Cardiovascular: regular rate and rhythm, S1-S2 Respiratory: click auscultation bilaterally Abdomen: Soft, nontender, nondistended, positive bowel sounds Extremities: No clubbing or cyanosis or edema  Discharge Instructions     Medication List    STOP taking these medications       naproxen sodium 220 MG tablet  Commonly known as:  ANAPROX      TAKE these medications       amLODipine 10 MG tablet  Commonly known  as:  NORVASC  Take 1 tablet (10 mg total) by mouth daily.     atenolol 50 MG tablet  Commonly known as:  TENORMIN  Take 2 tablets (100 mg total) by mouth daily.     COLON CLEANSER PO  Take 1  tablet by mouth daily.     fish oil-omega-3 fatty acids 1000 MG capsule  Take 1 g by mouth daily.     hydrochlorothiazide 25 MG tablet  Commonly known as:  HYDRODIURIL  Take 1 tablet (25 mg total) by mouth daily.     HYDROcodone-acetaminophen 5-325 MG per tablet  Commonly known as:  NORCO/VICODIN  Take 1-2 tablets by mouth every 4 (four) hours as needed for moderate pain.       Allergies  Allergen Reactions  . Lisinopril Swelling       Follow-up Information   Follow up with Stan Head, MD In 2 weeks.   Specialty:  Gastroenterology   Contact information:   [address] [place] [address] [phone]        The results of significant diagnostics from this hospitalization (including imaging, microbiology, ancillary and laboratory) are listed below for reference.    Significant Diagnostic Studies: Ct Abdomen Pelvis W Contrast  05/13/2013     IMPRESSION: 1. Soft tissue inflammation and fluid tracking about the body of the stomach, with apparent wall thickening and an unusual enhancement pattern to the gastric mucosa. Fluid tracks inferiorly to the level of the upper pole of the left kidney. This may reflect acute gastritis, or possibly a poorly characterized underlying gastric ulcer. No definite evidence of perforation or abscess formation at this time. 2. Vague poorly characterized 2.3 x 1.4 cm hypodensity within the tail of the pancreas, of uncertain significance. MRCP would be helpful for further evaluation, when and as deemed clinically appropriate (when the patient is able to hold his breath for the study). 3. Scattered calcification along the distal abdominal aorta and its branches. 4. Diverticulosis along the entirety of the colon, with partial sparing of the sigmoid colon.   Electronically Signed   By: Roanna Raider M.D.   On: 05/13/2013 02:31   Mr Roe Coombs W/wo Cm/mrcp  05/13/2013     IMPRESSION: Motion degraded images.  Hypoenhancing lesion in the pancreatic tail  with surrounding peripancreatic inflammatory changes, favored to reflect focal acute pancreatitis. An underlying mass is difficult to exclude in the acute setting.  Secondary fluid/inflammatory changes extending along the inferior aspect of the stomach. No drainable fluid collection/abscess at the current time.  If there is continued clinical concern for a pancreatic mass, consider follow-up MRI abdomen with/without contrast in 3-4 weeks.   Electronically Signed   By: Charline Bills M.D.   On: 05/13/2013 15:46    Microbiology: Recent Results (from the past 240 hour(s))  URINE CULTURE     Status: None   Collection Time    05/13/13  1:57 AM      Result Value Range Status   Specimen Description URINE, RANDOM   Final   Special Requests NONE   Final   Culture  Setup Time     Final   Value: 05/13/2013 09:49     Performed at [facility]   Culture     Final   Value: NO GROWTH     Performed at [facility]   Report Status 05/14/2013 FINAL   Final     Labs: Basic Metabolic Panel:  Recent Labs Lab 05/12/13 1805 05/14/13 5784  NA 138 139  K 3.3* 3.4*  CL 101 104  CO2 22 23  GLUCOSE 103* 99  BUN 7 7  CREATININE 0.93 0.97  CALCIUM 9.5 8.8   Liver Function Tests:  Recent Labs Lab 05/12/13 1805  AST 17  ALT 12  ALKPHOS 82  BILITOT 0.3  PROT 7.4  ALBUMIN 3.4*    Recent Labs Lab 05/12/13 1805 05/14/13 0620  LIPASE 133* 97*   No results found for this basename: AMMONIA,  in the last 168 hours CBC:  Recent Labs Lab 05/12/13 1805 05/14/13 0620  WBC 6.4 4.6  NEUTROABS 3.1  --   HGB 15.9 15.5  HCT 47.8 45.1  MCV 90.5 92.4  PLT 223 222      Signed:  Brycelynn Stampley K  [facility] 05/14/2013, 8:18 AM

## 2013-05-14 NOTE — Progress Notes (Signed)
Patient discharged to home. Patient AVS reviewed. Patient verbalized understanding of medications and follow-up appointments.  Patient remains stable; no signs or symptoms of distress.  Patient educated to return to the ER in cases of SOB, dizziness, fever, chest pain, or fainting.  

## 2013-05-26 ENCOUNTER — Ambulatory Visit: Payer: Self-pay | Admitting: Physician Assistant

## 2013-10-07 ENCOUNTER — Telehealth: Payer: Self-pay | Admitting: Internal Medicine

## 2013-10-07 NOTE — Telephone Encounter (Signed)
Pt. Needs all bp meds refilled..... Please call pt. When refills are ready...pt. Has scheduled appt. With doctor on 6.23.15.Marland Kitchen

## 2013-10-26 ENCOUNTER — Other Ambulatory Visit: Payer: Self-pay | Admitting: Emergency Medicine

## 2013-10-26 MED ORDER — AMLODIPINE BESYLATE 10 MG PO TABS: 10.0000 mg | ORAL_TABLET | Freq: Every day | ORAL | Status: DC

## 2013-10-26 MED ORDER — HYDROCHLOROTHIAZIDE 25 MG PO TABS: 25.0000 mg | ORAL_TABLET | Freq: Every day | ORAL | Status: DC

## 2013-12-06 ENCOUNTER — Encounter (HOSPITAL_COMMUNITY): Payer: Self-pay | Admitting: Emergency Medicine

## 2013-12-06 ENCOUNTER — Emergency Department (HOSPITAL_COMMUNITY)
Admission: EM | Admit: 2013-12-06 | Discharge: 2013-12-06 | Disposition: A | Discharge status: Home / Self Care | Payer: Medicaid Other | Attending: Emergency Medicine | Admitting: Emergency Medicine

## 2013-12-06 ENCOUNTER — Emergency Department (HOSPITAL_COMMUNITY): Payer: Medicaid Other

## 2013-12-06 DIAGNOSIS — R000 Tachycardia, unspecified: Secondary | ICD-10-CM | POA: Insufficient documentation

## 2013-12-06 DIAGNOSIS — G8929 Other chronic pain: Secondary | ICD-10-CM | POA: Insufficient documentation

## 2013-12-06 DIAGNOSIS — Z79899 Other long term (current) drug therapy: Secondary | ICD-10-CM | POA: Diagnosis not present

## 2013-12-06 DIAGNOSIS — I10 Essential (primary) hypertension: Secondary | ICD-10-CM | POA: Insufficient documentation

## 2013-12-06 DIAGNOSIS — R109 Unspecified abdominal pain: Secondary | ICD-10-CM

## 2013-12-06 DIAGNOSIS — R079 Chest pain, unspecified: Secondary | ICD-10-CM | POA: Insufficient documentation

## 2013-12-06 DIAGNOSIS — R1013 Epigastric pain: Secondary | ICD-10-CM | POA: Insufficient documentation

## 2013-12-06 DIAGNOSIS — R0602 Shortness of breath: Secondary | ICD-10-CM | POA: Diagnosis not present

## 2013-12-06 DIAGNOSIS — F17200 Nicotine dependence, unspecified, uncomplicated: Secondary | ICD-10-CM | POA: Insufficient documentation

## 2013-12-06 DIAGNOSIS — I252 Old myocardial infarction: Secondary | ICD-10-CM | POA: Insufficient documentation

## 2013-12-06 LAB — CBC
HCT: 52.4 % — ABNORMAL HIGH (ref 39.0–52.0)
Hemoglobin: 17.8 g/dL — ABNORMAL HIGH (ref 13.0–17.0)
MCH: 31.8 pg (ref 26.0–34.0)
MCHC: 34 g/dL (ref 30.0–36.0)
MCV: 93.7 fL (ref 78.0–100.0)
Platelets: 257 10*3/uL (ref 150–400)
RBC: 5.59 MIL/uL (ref 4.22–5.81)
RDW: 14.1 % (ref 11.5–15.5)
WBC: 5.4 10*3/uL (ref 4.0–10.5)

## 2013-12-06 LAB — BASIC METABOLIC PANEL
BUN: 6 mg/dL (ref 6–23)
CO2: 22 mEq/L (ref 19–32)
Calcium: 10.2 mg/dL (ref 8.4–10.5)
Chloride: 102 mEq/L (ref 96–112)
Creatinine, Ser: 0.81 mg/dL (ref 0.50–1.35)
GFR calc Af Amer: >90 mL/min (ref >90)
GFR calc non Af Amer: >90 mL/min (ref >90)
Glucose, Bld: 135 mg/dL — ABNORMAL HIGH (ref 70–99)
Potassium: 3.6 mEq/L — ABNORMAL LOW (ref 3.7–5.3)
Sodium: 142 mEq/L (ref 137–147)

## 2013-12-06 LAB — LIPASE, BLOOD: Lipase: 79 U/L — ABNORMAL HIGH (ref 11–59)

## 2013-12-06 LAB — I-STAT TROPONIN, ED
Troponin i, poc: 0.04 ng/mL (ref 0.00–0.08)
Troponin i, poc: 0.03 ng/mL (ref 0.00–0.08)

## 2013-12-06 LAB — POC OCCULT BLOOD, ED: Fecal Occult Bld: NEGATIVE

## 2013-12-06 MED ORDER — GI COCKTAIL ~~LOC~~
30.0000 mL | Freq: Once | ORAL | Status: AC
  Administered 2013-12-06: 30 mL via ORAL
  Filled 2013-12-06: qty 30

## 2013-12-06 MED ORDER — SODIUM CHLORIDE 0.9 % IV BOLUS (SEPSIS)
1000.0000 mL | Freq: Once | INTRAVENOUS | Status: AC
  Administered 2013-12-06: 1000 mL via INTRAVENOUS

## 2013-12-06 MED ORDER — HYDROCHLOROTHIAZIDE 25 MG PO TABS: 25.0000 mg | ORAL_TABLET | Freq: Every day | ORAL | Status: DC

## 2013-12-06 NOTE — ED Notes (Signed)
Pt on monitor 

## 2013-12-06 NOTE — ED Provider Notes (Signed)
CSN: [id number]     Arrival date & time 12/06/13  1352 History   First MD Initiated Contact with Patient 12/06/13 1530     Chief Complaint  Patient presents with  . Abdominal Pain  . Chest Pain   62 yo M w/ untreated HTN here with 1 week of epigastric, chest and abdominal pain, sharp stabbing in nature. No vomiting, fevers, diarrhea or constipation. Has had multiple bloody BM's over last 2 years. Chest pain is more epigastric in nature, no sob at rest but does have some dyspnea with walking up stairs that has worsened over last year. No leg swelling, back pain or rashes. No light headedness, dizziness or other associated symptoms. Had similar symptoms previously and tx for pancreatitis. Drinks alcohol and states he drinks at least a 40 oz of icehouse daily.   (Consider location/radiation/quality/duration/timing/severity/associated sxs/prior Treatment) Patient is a 63 y.o. male presenting with abdominal pain.  Abdominal Pain Pain location:  Epigastric, LLQ and RLQ Associated symptoms: chest pain (chronic) and shortness of breath (chronic)   Associated symptoms: no chills, no cough, no dysuria, no fever, no hematuria, no nausea and no vomiting     Past Medical History  Diagnosis Date  . Hypertension   . Chronic back pain   . Right shoulder pain   . Myocardial infarction   . Headache(784.0)   . Abnormal CT scan, pancreas - ? focal pancreatitis  05/13/2013  . Abdominal aortic atherosclerosis 05/13/2013   Past Surgical History  Procedure Laterality Date  . No past surgeries     No family history on file. History  Substance Use Topics  . Smoking status: Current Every Day Smoker -- 0.25 packs/day    Types: Cigarettes  . Smokeless tobacco: Never Used  . Alcohol Use: 2.4 oz/week    4 Cans of beer per week    Review of Systems  Constitutional: Negative for fever and chills.  HENT: Negative for congestion.   Respiratory: Positive for shortness of breath (chronic). Negative for cough.    Cardiovascular: Positive for chest pain (chronic). Negative for palpitations.  Gastrointestinal: Positive for abdominal pain (epigastric). Negative for nausea and vomiting.  Endocrine: Negative for polydipsia and polyuria.  Genitourinary: Negative for dysuria and hematuria.  Musculoskeletal: Negative for back pain.  Skin: Negative for rash and wound.  Neurological: Negative for seizures and numbness.  Psychiatric/Behavioral: Negative for confusion and agitation.      Allergies  Lisinopril  Home Medications   Prior to Admission medications   Medication Sig Start Date End Date Taking? Authorizing Provider  amLODipine (NORVASC) 10 MG tablet Take 1 tablet (10 mg total) by mouth daily. 10/26/13   Angelica Chessman, MD  atenolol (TENORMIN) 50 MG tablet Take 2 tablets (100 mg total) by mouth daily. 04/16/13   Nishant Dhungel, MD  hydrochlorothiazide (HYDRODIURIL) 25 MG tablet Take 1 tablet (25 mg total) by mouth daily. 12/06/13   Merrily Pew, MD   BP 186/107  Pulse 107  Temp(Src) 98.5 F (36.9 C) (Oral)  Resp 20  SpO2 96% Physical Exam  Nursing note and vitals reviewed. Constitutional: He is oriented to person, place, and time. He appears well-developed and well-nourished.  HENT:  Head: Normocephalic and atraumatic.  Eyes: Pupils are equal, round, and reactive to light.  Cardiovascular: Regular rhythm.  Tachycardia present.   Pulmonary/Chest: Effort normal and breath sounds normal. No respiratory distress.  Abdominal: He exhibits no distension. There is no tenderness.  Musculoskeletal: Normal range of motion. He exhibits no edema and  no tenderness.  Neurological: He is alert and oriented to person, place, and time.  Skin: Skin is warm and dry. No erythema.    ED Course  Procedures (including critical care time) Labs Review Labs Reviewed  CBC - Abnormal; Notable for the following:    Hemoglobin 17.8 (*)    HCT 52.4 (*)    All other components within normal limits  BASIC  METABOLIC PANEL - Abnormal; Notable for the following:    Potassium 3.6 (*)    Glucose, Bld 135 (*)    All other components within normal limits  LIPASE, BLOOD - Abnormal; Notable for the following:    Lipase 79 (*)    All other components within normal limits  I-STAT TROPOININ, ED  POC OCCULT BLOOD, ED  Randolm Idol, ED    Imaging Review Dg Chest 2 View  12/06/2013   CLINICAL DATA:  Abdominal and chest pain.  EXAM: CHEST  2 VIEW  COMPARISON:  07/06/2010  FINDINGS: Cardiac silhouette is normal in size. Normal mediastinal and hilar contours.  Reticular scarring is noted in the right upper lobe inferiorly near the minor fissure, and in the right middle lobe. No lung consolidation. No edema. No pleural effusions or pneumothoraces. Lungs are mildly hyperexpanded.  Bony thorax is intact.  IMPRESSION: No acute cardiopulmonary disease.   Electronically Signed   By: Lajean Manes M.D.   On: 12/06/2013 15:24     EKG Interpretation None      MDM   63 yo M w/ h/o pancreatitis, HTN, MI and HA here with epigastric pain worse for last 3 days. No GI, cardiac or respiratory symptoms that are new. Still consistently drinking alcohol daily. Thought initially may have pancreatitis however no nausea, vomiting, is tolerating PO without a problem and lipase is only in 70's. No need for CT based on exam. Initially HTN but no signs of hypertensive urgency. Troponins trended and stable, ECG similar to prior. Will give Rx for home dose of HCTZ and will FU w/ PCP about further meds.  H/O blood in BM's, hemoccult negative here, unsure of cause but will follow up with PCP about colonoscopy.  Initially tachycardic, improved with fluids.  Symptoms improved with Gi cocktail. Doubt cardiac causes, pancreatitis or surgical causes of abdominal pain. Stable for d/c with close PCP follow up.   Final diagnoses:  Chest pain  Abdominal pain     Merrily Pew, MD 12/07/13 0028

## 2013-12-06 NOTE — ED Notes (Signed)
Pt reports abdominal pain and cp for several days, also his throat hurts and feels like he has something in his throat. Denies n/v/d. Pt is a x 4.

## 2013-12-07 NOTE — ED Provider Notes (Signed)
I saw and evaluated the patient, reviewed the resident's note and I agree with the findings and plan.   EKG Interpretation None     Patient with epigastric pain. EKG reassuring. Has history pancreatitis. Lipase is mildly elevated, but decreased from previous, but could be on its way up. Patient feels better and is tolerating orals. Doubt cardiac cause. Will discharge  Steven Delacruz. Alvino Chapel, MD 12/07/13 1450

## 2013-12-21 ENCOUNTER — Encounter: Payer: Self-pay | Admitting: Internal Medicine

## 2013-12-21 ENCOUNTER — Ambulatory Visit: Payer: Medicaid Other | Attending: Internal Medicine | Admitting: Internal Medicine

## 2013-12-21 VITALS — BP 167/107 | HR 95 | Temp 97.7°F | Resp 14 | Ht 72.0 in | Wt 222.0 lb

## 2013-12-21 DIAGNOSIS — M545 Low back pain, unspecified: Secondary | ICD-10-CM | POA: Diagnosis not present

## 2013-12-21 DIAGNOSIS — R7303 Prediabetes: Secondary | ICD-10-CM

## 2013-12-21 DIAGNOSIS — Z91199 Patient's noncompliance with other medical treatment and regimen due to unspecified reason: Secondary | ICD-10-CM | POA: Insufficient documentation

## 2013-12-21 DIAGNOSIS — Z9119 Patient's noncompliance with other medical treatment and regimen: Secondary | ICD-10-CM | POA: Diagnosis not present

## 2013-12-21 DIAGNOSIS — Z888 Allergy status to other drugs, medicaments and biological substances status: Secondary | ICD-10-CM | POA: Insufficient documentation

## 2013-12-21 DIAGNOSIS — N529 Male erectile dysfunction, unspecified: Secondary | ICD-10-CM | POA: Diagnosis not present

## 2013-12-21 DIAGNOSIS — I10 Essential (primary) hypertension: Secondary | ICD-10-CM | POA: Diagnosis present

## 2013-12-21 DIAGNOSIS — R7309 Other abnormal glucose: Secondary | ICD-10-CM | POA: Diagnosis not present

## 2013-12-21 DIAGNOSIS — Z1211 Encounter for screening for malignant neoplasm of colon: Secondary | ICD-10-CM | POA: Insufficient documentation

## 2013-12-21 DIAGNOSIS — N522 Drug-induced erectile dysfunction: Secondary | ICD-10-CM

## 2013-12-21 DIAGNOSIS — Z79899 Other long term (current) drug therapy: Secondary | ICD-10-CM | POA: Diagnosis not present

## 2013-12-21 DIAGNOSIS — T50904A Poisoning by unspecified drugs, medicaments and biological substances, undetermined, initial encounter: Secondary | ICD-10-CM

## 2013-12-21 LAB — BASIC METABOLIC PANEL
BUN: 7 mg/dL (ref 6–23)
CO2: 26 mEq/L (ref 19–32)
Calcium: 9.7 mg/dL (ref 8.4–10.5)
Chloride: 102 mEq/L (ref 96–112)
Creat: 1.07 mg/dL (ref 0.50–1.35)
Glucose, Bld: 95 mg/dL (ref 70–99)
Potassium: 3.6 mEq/L (ref 3.5–5.3)
Sodium: 138 mEq/L (ref 135–145)

## 2013-12-21 LAB — POCT GLYCOSYLATED HEMOGLOBIN (HGB A1C): Hemoglobin A1C: 5.4

## 2013-12-21 MED ORDER — TADALAFIL 20 MG PO TABS: 10.0000 mg | ORAL_TABLET | ORAL | Status: DC | PRN

## 2013-12-21 MED ORDER — HYDROCHLOROTHIAZIDE 25 MG PO TABS: 25.0000 mg | ORAL_TABLET | Freq: Every day | ORAL | Status: DC

## 2013-12-21 MED ORDER — CLONIDINE HCL 0.1 MG PO TABS
0.1000 mg | ORAL_TABLET | Freq: Once | ORAL | Status: AC
  Administered 2013-12-21: 0.1 mg via ORAL

## 2013-12-21 MED ORDER — ATENOLOL 50 MG PO TABS: 100.0000 mg | ORAL_TABLET | Freq: Every day | ORAL | Status: DC

## 2013-12-21 MED ORDER — AMLODIPINE BESYLATE 10 MG PO TABS: 10.0000 mg | ORAL_TABLET | Freq: Every day | ORAL | Status: DC

## 2013-12-21 NOTE — Progress Notes (Signed)
Patient ID: Steven Delacruz, male   DOB: 10-Mar-1951, 63 y.o.   MRN: [id number]   Steven Delacruz, is a 63 y.o. male  YBO:[id number]  IDP:[id number]  DOB - 07-26-1950  Chief Complaint  Patient presents with  . Follow-up        Subjective:   Steven Delacruz is a 63 y.o. male here today for a follow up visit. Patient is known to have hypertension, and chronic low back pain here today for medication refills and followup of his blood pressure. He is noncompliant with medication, he said he is getting out of blood pressure medication for about a month or more, he has no complaint today. For some times now he has had erectile dysfunction which he attributes to his antihypertensives, he has been on Viagra in the past but would like to try something different because Viagra is not strong enough. Patient continue to smoke cigarette but down to about a quarter of a pack per day, drinks alcohol. Patient has had colonoscopy done in 2008, recommendation was to repeat in 3 years based on findings of diffuse diverticulosis and polyps but has not done the repeat colonoscopy. Patient has No headache, No chest pain, No abdominal pain - No Nausea, No new weakness tingling or numbness, No Cough - SOB.  Problem  Essential Hypertension  Drug-Induced Erectile Dysfunction  Colon Cancer Screening    ALLERGIES: Allergies  Allergen Reactions  . Lisinopril Swelling    PAST MEDICAL HISTORY: Past Medical History  Diagnosis Date  . Hypertension   . Chronic back pain   . Right shoulder pain   . Myocardial infarction   . Headache(784.0)   . Abnormal CT scan, pancreas - ? focal pancreatitis  05/13/2013  . Abdominal aortic atherosclerosis 05/13/2013    MEDICATIONS AT HOME: Prior to Admission medications   Medication Sig Start Date End Date Taking? Authorizing Provider  amLODipine (NORVASC) 10 MG tablet Take 1 tablet (10 mg total) by mouth daily. 12/21/13  Yes Angelica Chessman, MD  atenolol (TENORMIN) 50  MG tablet Take 2 tablets (100 mg total) by mouth daily. 12/21/13  Yes Angelica Chessman, MD  hydrochlorothiazide (HYDRODIURIL) 25 MG tablet Take 1 tablet (25 mg total) by mouth daily. 12/21/13  Yes Angelica Chessman, MD  tadalafil (CIALIS) 20 MG tablet Take 0.5-1 tablets (10-20 mg total) by mouth every other day as needed for erectile dysfunction. 12/21/13   Angelica Chessman, MD     Objective:   Filed Vitals:   12/21/13 1008  BP: 170/104  Pulse: 95  Temp: 97.7 F (36.5 C)  TempSrc: Oral  Resp: 14  Height: 6' (1.829 m)  Weight: 222 lb (100.699 kg)  SpO2: 95%    Exam General appearance : Awake, alert, not in any distress. Speech Clear. Not toxic looking HEENT: Atraumatic and Normocephalic, pupils equally reactive to light and accomodation Neck: supple, no JVD. No cervical lymphadenopathy.  Chest:Good air entry bilaterally, no added sounds  CVS: S1 S2 regular, no murmurs.  Abdomen: Bowel sounds present, Non tender and not distended with no gaurding, rigidity or rebound. Extremities: B/L Lower Ext shows no edema, both legs are warm to touch Neurology: Awake alert, and oriented X 3, CN II-XII intact, Non focal Skin:No Rash Wounds:N/A  Data Review Lab Results  Component Value Date   HGBA1C 6.3* 03/03/2013   HGBA1C  Value: 5.7 (NOTE)  According to the ADA Clinical Practice Recommendations for 2011, when HbA1c is used as a screening test:   >=6.5%   Diagnostic of Diabetes Mellitus           (if abnormal result  is confirmed)  5.7-6.4%   Increased risk of developing Diabetes Mellitus  References:Diagnosis and Classification of Diabetes Mellitus,Diabetes PIRJ,1884,16(SAYTK 1):S62-S69 and Standards of Medical Care in         Diabetes - 2011,Diabetes ZSWF,0932,35  (Suppl 1):S11-S61.* 07/07/2010     Assessment & Plan   1. Essential hypertension  - cloNIDine (CATAPRES) tablet 0.1 mg; Take 1 tablet (0.1 mg total) by mouth  once.  - Basic Metabolic Panel  Refill - amLODipine (NORVASC) 10 MG tablet; Take 1 tablet (10 mg total) by mouth daily.  Dispense: 90 tablet; Refill: 3 - atenolol (TENORMIN) 50 MG tablet; Take 2 tablets (100 mg total) by mouth daily.  Dispense: 90 tablet; Refill: 3 - hydrochlorothiazide (HYDRODIURIL) 25 MG tablet; Take 1 tablet (25 mg total) by mouth daily.  Dispense: 90 tablet; Refill: 3  2. Prediabetes  - POCT glycosylated hemoglobin (Hb A1C) is 5.4% today  3. Drug-induced erectile dysfunction  - tadalafil (CIALIS) 20 MG tablet; Take 0.5-1 tablets (10-20 mg total) by mouth every other day as needed for erectile dysfunction.  Dispense: 20 tablet; Refill: 3  4. Colon cancer screening  - [facility] COLONOSCOPY - Ambulatory referral to Gastroenterology  Patient was counseled extensively on nutrition and exercise Patient was counseled extensively on smoking cessation   Return in about 3 months (around 03/23/2014), or if symptoms worsen or fail to improve, for Follow up HTN, Hemoglobin A1C and Follow up, DM.  The patient was given clear instructions to go to ER or return to medical center if symptoms don't improve, worsen or new problems develop. The patient verbalized understanding. The patient was told to call to get lab results if they haven't heard anything in the next week.   This note has been created with Surveyor, quantity. Any transcriptional errors are unintentional.    Angelica Chessman, MD, [place], [place], [place] and [facility] [place], [place]   12/21/2013, 11:15 AM

## 2013-12-21 NOTE — Patient Instructions (Signed)
Erectile Dysfunction Erectile dysfunction is the inability to get or sustain a good enough erection to have sexual intercourse. Erectile dysfunction may involve:  Inability to get an erection.  Lack of enough hardness to allow penetration.  Loss of the erection before sex is finished.  Premature ejaculation. CAUSES  Certain drugs, such as:  Pain relievers.  Antihistamines.  Antidepressants.  Blood pressure medicines.  Water pills (diuretics).  Ulcer medicines.  Muscle relaxants.  Illegal drugs.  Excessive drinking.  Psychological causes, such as:  Anxiety.  Depression.  Sadness.  Exhaustion.  Performance fear.  Stress.  Physical causes, such as:  Artery problems. This may include diabetes, smoking, liver disease, or atherosclerosis.  High blood pressure.  Hormonal problems, such as low testosterone.  Obesity.  Nerve problems. This may include back or pelvic injuries, diabetes mellitus, multiple sclerosis, or Parkinson disease. SYMPTOMS  Inability to get an erection.  Lack of enough hardness to allow penetration.  Loss of the erection before sex is finished.  Premature ejaculation.  Normal erections at some times, but with frequent unsatisfactory episodes.  Orgasms that are not satisfactory in sensation or frequency.  Low sexual satisfaction in either partner because of erection problems.  A curved penis occurring with erection. The curve may cause pain or may be too curved to allow for intercourse.  Never having nighttime erections. DIAGNOSIS Your caregiver can often diagnose this condition by:  Performing a physical exam to find other diseases or specific problems with the penis.  Asking you detailed questions about the problem.  Performing blood tests to check for diabetes mellitus or to measure hormone levels.  Performing urine tests to find other underlying health conditions.  Performing an ultrasound exam to check for  scarring.  Performing a test to check blood flow to the penis.  Doing a sleep study at home to measure nighttime erections. TREATMENT   You may be prescribed medicines by mouth.  You may be given medicine injections into the penis.  You may be prescribed a vacuum pump with a ring.  Penile implant surgery may be performed. You may receive:  An inflatable implant.  A semirigid implant.  Blood vessel surgery may be performed. HOME CARE INSTRUCTIONS  If you are prescribed oral medicine, you should take the medicine as prescribed. Do not increase the dosage without first discussing it with your physician.  If you are using self-injections, be careful to avoid any veins that are on the surface of the penis. Apply pressure to the injection site for 5 minutes.  If you are using a vacuum pump, make sure you have read the instructions before using it. Discuss any questions with your physician before taking the pump home. SEEK MEDICAL CARE IF:  You experience pain that is not responsive to the pain medicine you have been prescribed.  You experience nausea or vomiting. SEEK IMMEDIATE MEDICAL CARE IF:   When taking oral or injectable medications, you experience an erection that lasts longer than 4 hours. If your physician is unavailable, go to the nearest emergency room for evaluation. An erection that lasts much longer than 4 hours can result in permanent damage to your penis.  You have pain that is severe.  You develop redness, severe pain, or severe swelling of your penis.  You have redness spreading up into your groin or lower abdomen.  You are unable to pass your urine. Document Released: 06/14/2000 Document Revised: 02/17/2013 Document Reviewed: 11/19/2012 [facility] Patient Information 2015 [place], [place]. This information is not  intended to replace advice given to you by your health care provider. Make sure you discuss any questions you have with your health care  provider. Hypertension Hypertension, commonly called high blood pressure, is when the force of blood pumping through your arteries is too strong. Your arteries are the blood vessels that carry blood from your heart throughout your body. A blood pressure reading consists of a higher number over a lower number, such as 110/72. The higher number (systolic) is the pressure inside your arteries when your heart pumps. The lower number (diastolic) is the pressure inside your arteries when your heart relaxes. Ideally you want your blood pressure below 120/80. Hypertension forces your heart to work harder to pump blood. Your arteries may become narrow or stiff. Having hypertension puts you at risk for heart disease, stroke, and other problems.  RISK FACTORS Some risk factors for high blood pressure are controllable. Others are not.  Risk factors you cannot control include:   Race. You may be at higher risk if you are African American.  Age. Risk increases with age.  Gender. Men are at higher risk than women before age 74 years. After age 58, women are at higher risk than men. Risk factors you can control include:  Not getting enough exercise or physical activity.  Being overweight.  Getting too much fat, sugar, calories, or salt in your diet.  Drinking too much alcohol. SIGNS AND SYMPTOMS Hypertension does not usually cause signs or symptoms. Extremely high blood pressure (hypertensive crisis) may cause headache, anxiety, shortness of breath, and nosebleed. DIAGNOSIS  To check if you have hypertension, your health care provider will measure your blood pressure while you are seated, with your arm held at the level of your heart. It should be measured at least twice using the same arm. Certain conditions can cause a difference in blood pressure between your right and left arms. A blood pressure reading that is higher than normal on one occasion does not mean that you need treatment. If one blood  pressure reading is high, ask your health care provider about having it checked again. TREATMENT  Treating high blood pressure includes making lifestyle changes and possibly taking medication. Living a healthy lifestyle can help lower high blood pressure. You may need to change some of your habits. Lifestyle changes may include:  Following the DASH diet. This diet is high in fruits, vegetables, and whole grains. It is low in salt, red meat, and added sugars.  Getting at least 2 1/2 hours of brisk physical activity every week.  Losing weight if necessary.  Not smoking.  Limiting alcoholic beverages.  Learning ways to reduce stress. If lifestyle changes are not enough to get your blood pressure under control, your health care provider may prescribe medicine. You may need to take more than one. Work closely with your health care provider to understand the risks and benefits. HOME CARE INSTRUCTIONS  Have your blood pressure rechecked as directed by your health care provider.   Only take medicine as directed by your health care provider. Follow the directions carefully. Blood pressure medicines must be taken as prescribed. The medicine does not work as well when you skip doses. Skipping doses also puts you at risk for problems.   Do not smoke.   Monitor your blood pressure at home as directed by your health care provider. SEEK MEDICAL CARE IF:   You think you are having a reaction to medicines taken.  You have recurrent headaches or feel dizzy.  You have swelling in your ankles.  You have trouble with your vision. SEEK IMMEDIATE MEDICAL CARE IF:  You develop a severe headache or confusion.  You have unusual weakness, numbness, or feel faint.  You have severe chest or abdominal pain.  You vomit repeatedly.  You have trouble breathing. MAKE SURE YOU:   Understand these instructions.  Will watch your condition.  Will get help right away if you are not doing well or  get worse. Document Released: 06/17/2005 Document Revised: 06/22/2013 Document Reviewed: 04/09/2013 [facility] Patient Information 2015 [place], [place]. This information is not intended to replace advice given to you by your health care provider. Make sure you discuss any questions you have with your health care provider. DASH Eating Plan DASH stands for "Dietary Approaches to Stop Hypertension." The DASH eating plan is a healthy eating plan that has been shown to reduce high blood pressure (hypertension). Additional health benefits may include reducing the risk of type 2 diabetes mellitus, heart disease, and stroke. The DASH eating plan may also help with weight loss. WHAT DO I NEED TO KNOW ABOUT THE DASH EATING PLAN? For the DASH eating plan, you will follow these general guidelines:  Choose foods with a percent daily value for sodium of less than 5% (as listed on the food label).  Use salt-free seasonings or herbs instead of table salt or sea salt.  Check with your health care provider or pharmacist before using salt substitutes.  Eat lower-sodium products, often labeled as "lower sodium" or "no salt added."  Eat fresh foods.  Eat more vegetables, fruits, and low-fat dairy products.  Choose whole grains. Look for the word "whole" as the first word in the ingredient list.  Choose fish and skinless chicken or [place] more often than red meat. Limit fish, poultry, and meat to 6 oz (170 g) each day.  Limit sweets, desserts, sugars, and sugary drinks.  Choose heart-healthy fats.  Limit cheese to 1 oz (28 g) per day.  Eat more home-cooked food and less restaurant, buffet, and fast food.  Limit fried foods.  Cook foods using methods other than frying.  Limit canned vegetables. If you do use them, rinse them well to decrease the sodium.  When eating at a restaurant, ask that your food be prepared with less salt, or no salt if possible. WHAT FOODS CAN I EAT? Seek help from a dietitian  for individual calorie needs. Grains Whole grain or whole wheat bread. Brown rice. Whole grain or whole wheat pasta. Quinoa, bulgur, and whole grain cereals. Low-sodium cereals. Corn or whole wheat flour tortillas. Whole grain cornbread. Whole grain crackers. Low-sodium crackers. Vegetables Fresh or frozen vegetables (raw, steamed, roasted, or grilled). Low-sodium or reduced-sodium tomato and vegetable juices. Low-sodium or reduced-sodium tomato sauce and paste. Low-sodium or reduced-sodium canned vegetables.  Fruits All fresh, canned (in natural juice), or frozen fruits. Meat and Other Protein Products Ground beef (85% or leaner), grass-fed beef, or beef trimmed of fat. Skinless chicken or [place]. Ground chicken or [place]. Pork trimmed of fat. All fish and seafood. Eggs. Dried beans, peas, or lentils. Unsalted nuts and seeds. Unsalted canned beans. Dairy Low-fat dairy products, such as skim or 1% milk, 2% or reduced-fat cheeses, low-fat ricotta or cottage cheese, or plain low-fat yogurt. Low-sodium or reduced-sodium cheeses. Fats and Oils Tub margarines without trans fats. Light or reduced-fat mayonnaise and salad dressings (reduced sodium). Avocado. Safflower, olive, or canola oils. Natural peanut or almond butter. Other Unsalted popcorn and pretzels. The items  listed above may not be a complete list of recommended foods or beverages. Contact your dietitian for more options. WHAT FOODS ARE NOT RECOMMENDED? Grains White bread. White pasta. White rice. Refined cornbread. Bagels and croissants. Crackers that contain trans fat. Vegetables Creamed or fried vegetables. Vegetables in a cheese sauce. Regular canned vegetables. Regular canned tomato sauce and paste. Regular tomato and vegetable juices. Fruits Dried fruits. Canned fruit in light or heavy syrup. Fruit juice. Meat and Other Protein Products Fatty cuts of meat. Ribs, chicken wings, bacon, sausage, bologna, salami, chitterlings, fatback,  hot dogs, bratwurst, and packaged luncheon meats. Salted nuts and seeds. Canned beans with salt. Dairy Whole or 2% milk, cream, half-and-half, and cream cheese. Whole-fat or sweetened yogurt. Full-fat cheeses or blue cheese. Nondairy creamers and whipped toppings. Processed cheese, cheese spreads, or cheese curds. Condiments Onion and garlic salt, seasoned salt, table salt, and sea salt. Canned and packaged gravies. Worcestershire sauce. Tartar sauce. Barbecue sauce. Teriyaki sauce. Soy sauce, including reduced sodium. Steak sauce. Fish sauce. Oyster sauce. Cocktail sauce. Horseradish. Ketchup and mustard. Meat flavorings and tenderizers. Bouillon cubes. Hot sauce. Tabasco sauce. Marinades. Taco seasonings. Relishes. Fats and Oils Butter, stick margarine, lard, shortening, ghee, and bacon fat. Coconut, palm kernel, or palm oils. Regular salad dressings. Other Pickles and olives. Salted popcorn and pretzels. The items listed above may not be a complete list of foods and beverages to avoid. Contact your dietitian for more information. WHERE CAN I FIND MORE INFORMATION? National Heart, Lung, and Blood Institute: travelstabloid.com Document Released: 06/06/2011 Document Revised: 06/22/2013 Document Reviewed: 04/21/2013 [facility] Patient Information 2015 [place], [place]. This information is not intended to replace advice given to you by your health care provider. Make sure you discuss any questions you have with your health care provider.

## 2013-12-21 NOTE — Progress Notes (Signed)
Pt is here following up on his HTN. Pt reports that for a few weeks he has had severe pain in his lower abdomen. Pt is needing to refill his medications

## 2013-12-22 ENCOUNTER — Telehealth: Payer: Self-pay | Admitting: Emergency Medicine

## 2013-12-22 NOTE — Telephone Encounter (Signed)
Pt given lab results 

## 2013-12-22 NOTE — Telephone Encounter (Signed)
Message copied by Ricci Barker on Wed Dec 22, 2013 11:49 AM ------      Message from: Steven Delacruz E      Created: Wed Dec 22, 2013 10:23 AM       Please inform patient that his metabolic panel including potassium level is normal. His hemoglobin A1c shows that he is not diabetic ------

## 2014-03-22 ENCOUNTER — Encounter: Payer: Self-pay | Admitting: Internal Medicine

## 2014-03-22 ENCOUNTER — Ambulatory Visit: Payer: Medicaid Other | Attending: Internal Medicine | Admitting: Internal Medicine

## 2014-03-22 VITALS — BP 130/84 | HR 59 | Temp 98.0°F | Resp 14 | Ht 72.0 in | Wt 225.0 lb

## 2014-03-22 DIAGNOSIS — M545 Low back pain, unspecified: Secondary | ICD-10-CM | POA: Diagnosis not present

## 2014-03-22 DIAGNOSIS — G8929 Other chronic pain: Secondary | ICD-10-CM | POA: Diagnosis not present

## 2014-03-22 DIAGNOSIS — I252 Old myocardial infarction: Secondary | ICD-10-CM | POA: Insufficient documentation

## 2014-03-22 DIAGNOSIS — I10 Essential (primary) hypertension: Secondary | ICD-10-CM | POA: Diagnosis not present

## 2014-03-22 DIAGNOSIS — M25519 Pain in unspecified shoulder: Secondary | ICD-10-CM | POA: Insufficient documentation

## 2014-03-22 DIAGNOSIS — M25511 Pain in right shoulder: Secondary | ICD-10-CM

## 2014-03-22 MED ORDER — ACETAMINOPHEN-CODEINE #3 300-30 MG PO TABS: 1.0000 | ORAL_TABLET | ORAL | Status: DC | PRN

## 2014-03-22 NOTE — Progress Notes (Signed)
Pt is here following up on his HTN and chronic lower back pain. Pt states that he has pain a loss of ROM in his shoulders.

## 2014-03-22 NOTE — Progress Notes (Signed)
Patient ID: Steven Delacruz, male   DOB: 1951/03/24, 63 y.o.   MRN: [id number]   Steven Delacruz, is a 63 y.o. male  [id number]:[id number]  [id number]  DOB - [birth year]  Chief Complaint  Patient presents with  . Follow-up        Subjective:   Steven Delacruz is a 63 y.o. male here today for a follow up visit. Patient has medical history significant for hypertension, chronic back pain, right shoulder pain, here today following up on hypertension. Major complaint include ongoing low back pain and loss of range of motion in his shoulders especially the right. No injuries. No falls. Blood pressure is controlled. Patient claims compliant with medications. Patient has No headache, No chest pain, No abdominal pain - No Nausea, No new weakness tingling or numbness, No Cough - SOB.  Problem  Back Pain At L4-L5 Level  Pain in Joint, Shoulder Region    ALLERGIES: Allergies  Allergen Reactions  . Lisinopril Swelling    PAST MEDICAL HISTORY: Past Medical History  Diagnosis Date  . Hypertension   . Chronic back pain   . Right shoulder pain   . Myocardial infarction   . Headache(784.0)   . Abnormal CT scan, pancreas - ? focal pancreatitis  05/13/2013  . Abdominal aortic atherosclerosis 05/13/2013    MEDICATIONS AT HOME: Prior to Admission medications   Medication Sig Start Date End Date Taking? Authorizing Provider  amLODipine (NORVASC) 10 MG tablet Take 1 tablet (10 mg total) by mouth daily. 12/21/13  Yes Tresa Garter, MD  atenolol (TENORMIN) 50 MG tablet Take 2 tablets (100 mg total) by mouth daily. 12/21/13  Yes Tresa Garter, MD  hydrochlorothiazide (HYDRODIURIL) 25 MG tablet Take 1 tablet (25 mg total) by mouth daily. 12/21/13  Yes Tresa Garter, MD  acetaminophen-codeine (TYLENOL #3) 300-30 MG per tablet Take 1 tablet by mouth every 4 (four) hours as needed. 03/22/14   Tresa Garter, MD  tadalafil (CIALIS) 20 MG tablet Take 0.5-1 tablets (10-20 mg total) by  mouth every other day as needed for erectile dysfunction. 12/21/13   Tresa Garter, MD     Objective:   Filed Vitals:   03/22/14 1029  BP: 130/84  Pulse: 59  Temp: 98 F (36.7 C)  TempSrc: Oral  Resp: 14  Height: 6' (1.829 m)  Weight: 225 lb (102.059 kg)  SpO2: 96%    Exam General appearance : Awake, alert, not in any distress. Speech Clear. Not toxic looking HEENT: Atraumatic and Normocephalic, pupils equally reactive to light and accomodation Neck: supple, no JVD. No cervical lymphadenopathy.  Chest:Good air entry bilaterally, no added sounds  CVS: S1 S2 regular, no murmurs.  Abdomen: Bowel sounds present, Non tender and not distended with no gaurding, rigidity or rebound. Extremities: B/L Lower Ext shows no edema, both legs are warm to touch Neurology: Awake alert, and oriented X 3, CN II-XII intact, Non focal Skin:No Rash Wounds:N/A  Data Review Lab Results  Component Value Date   HGBA1C 5.4 12/21/2013   HGBA1C 6.3* 03/03/2013   HGBA1C  Value: 5.7 (NOTE)  According to the ADA Clinical Practice Recommendations for 2011, when HbA1c is used as a screening test:   >=6.5%   Diagnostic of Diabetes Mellitus           (if abnormal result  is confirmed)  5.7-6.4%   Increased risk of developing Diabetes Mellitus  References:Diagnosis and Classification of Diabetes Mellitus,Diabetes DJSH,7026,37(CHYIF 1):S62-S69 and Standards of Medical Care in         Diabetes - 2011,Diabetes OYDX,4128,78  (Suppl 1):S11-S61.* 07/07/2010     Assessment & Plan   1. Pain in joint, shoulder region, right  - acetaminophen-codeine (TYLENOL #3) 300-30 MG per tablet; Take 1 tablet by mouth every 4 (four) hours as needed.  Dispense: 90 tablet; Refill: 0  2. Back pain at L4-L5 level  - acetaminophen-codeine (TYLENOL #3) 300-30 MG per tablet; Take 1 tablet by mouth every 4 (four) hours as needed.  Dispense: 90 tablet; Refill:  0   Return in about 3 months (around 06/21/2014), or if symptoms worsen or fail to improve, for Follow up HTN.  The patient was given clear instructions to go to ER or return to medical center if symptoms don't improve, worsen or new problems develop. The patient verbalized understanding. The patient was told to call to get lab results if they haven't heard anything in the next week.   This note has been created with Surveyor, quantity. Any transcriptional errors are unintentional.    Angelica Chessman, MD, [place], [place], [place] and [facility] [place], [place]   03/22/2014, 11:10 AM

## 2014-03-22 NOTE — Patient Instructions (Signed)
Back Pain, Adult Low back pain is very common. About 1 in 5 people have back pain.The cause of low back pain is rarely dangerous. The pain often gets better over time.About half of people with a sudden onset of back pain feel better in just 2 weeks. About 8 in 10 people feel better by 6 weeks.  CAUSES Some common causes of back pain include:  Strain of the muscles or ligaments supporting the spine.  Wear and tear (degeneration) of the spinal discs.  Arthritis.  Direct injury to the back. DIAGNOSIS Most of the time, the direct cause of low back pain is not known.However, back pain can be treated effectively even when the exact cause of the pain is unknown.Answering your caregiver's questions about your overall health and symptoms is one of the most accurate ways to make sure the cause of your pain is not dangerous. If your caregiver needs more information, he or she may order lab work or imaging tests (X-rays or MRIs).However, even if imaging tests show changes in your back, this usually does not require surgery. HOME CARE INSTRUCTIONS For many people, back pain returns.Since low back pain is rarely dangerous, it is often a condition that people can learn to manageon their own.   Remain active. It is stressful on the back to sit or stand in one place. Do not sit, drive, or stand in one place for more than 30 minutes at a time. Take short walks on level surfaces as soon as pain allows.Try to increase the length of time you walk each day.  Do not stay in bed.Resting more than 1 or 2 days can delay your recovery.  Do not avoid exercise or work.Your body is made to move.It is not dangerous to be active, even though your back may hurt.Your back will likely heal faster if you return to being active before your pain is gone.  Pay attention to your body when you bend and lift. Many people have less discomfortwhen lifting if they bend their knees, keep the load close to their bodies,and  avoid twisting. Often, the most comfortable positions are those that put less stress on your recovering back.  Find a comfortable position to sleep. Use a firm mattress and lie on your side with your knees slightly bent. If you lie on your back, put a pillow under your knees.  Only take over-the-counter or prescription medicines as directed by your caregiver. Over-the-counter medicines to reduce pain and inflammation are often the most helpful.Your caregiver may prescribe muscle relaxant drugs.These medicines help dull your pain so you can more quickly return to your normal activities and healthy exercise.  Put ice on the injured area.  Put ice in a plastic bag.  Place a towel between your skin and the bag.  Leave the ice on for 15-20 minutes, 03-04 times a day for the first 2 to 3 days. After that, ice and heat may be alternated to reduce pain and spasms.  Ask your caregiver about trying back exercises and gentle massage. This may be of some benefit.  Avoid feeling anxious or stressed.Stress increases muscle tension and can worsen back pain.It is important to recognize when you are anxious or stressed and learn ways to manage it.Exercise is a great option. SEEK MEDICAL CARE IF:  You have pain that is not relieved with rest or medicine.  You have pain that does not improve in 1 week.  You have new symptoms.  You are generally not feeling well. SEEK   IMMEDIATE MEDICAL CARE IF:   You have pain that radiates from your back into your legs.  You develop new bowel or bladder control problems.  You have unusual weakness or numbness in your arms or legs.  You develop nausea or vomiting.  You develop abdominal pain.  You feel faint. Document Released: 06/17/2005 Document Revised: 12/17/2011 Document Reviewed: 10/19/2013 ExitCare Patient Information 2015 [employer]. This information is not intended to replace advice given to you by your health care provider. Make sure you  discuss any questions you have with your health care provider. DASH Eating Plan DASH stands for "Dietary Approaches to Stop Hypertension." The DASH eating plan is a healthy eating plan that has been shown to reduce high blood pressure (hypertension). Additional health benefits may include reducing the risk of type 2 diabetes mellitus, heart disease, and stroke. The DASH eating plan may also help with weight loss. WHAT DO I NEED TO KNOW ABOUT THE DASH EATING PLAN? For the DASH eating plan, you will follow these general guidelines:  Choose foods with a percent daily value for sodium of less than 5% (as listed on the food label).  Use salt-free seasonings or herbs instead of table salt or sea salt.  Check with your health care provider or pharmacist before using salt substitutes.  Eat lower-sodium products, often labeled as "lower sodium" or "no salt added."  Eat fresh foods.  Eat more vegetables, fruits, and low-fat dairy products.  Choose whole grains. Look for the word "whole" as the first word in the ingredient list.  Choose fish and skinless chicken or turkey more often than red meat. Limit fish, poultry, and meat to 6 oz (170 g) each day.  Limit sweets, desserts, sugars, and sugary drinks.  Choose heart-healthy fats.  Limit cheese to 1 oz (28 g) per day.  Eat more home-cooked food and less restaurant, buffet, and fast food.  Limit fried foods.  Cook foods using methods other than frying.  Limit canned vegetables. If you do use them, rinse them well to decrease the sodium.  When eating at a restaurant, ask that your food be prepared with less salt, or no salt if possible. WHAT FOODS CAN I EAT? Seek help from a dietitian for individual calorie needs. Grains Whole grain or whole wheat bread. Brown rice. Whole grain or whole wheat pasta. Quinoa, bulgur, and whole grain cereals. Low-sodium cereals. Corn or whole wheat flour tortillas. Whole grain cornbread. Whole grain crackers.  Low-sodium crackers. Vegetables Fresh or frozen vegetables (raw, steamed, roasted, or grilled). Low-sodium or reduced-sodium tomato and vegetable juices. Low-sodium or reduced-sodium tomato sauce and paste. Low-sodium or reduced-sodium canned vegetables.  Fruits All fresh, canned (in natural juice), or frozen fruits. Meat and Other Protein Products Ground beef (85% or leaner), grass-fed beef, or beef trimmed of fat. Skinless chicken or turkey. Ground chicken or turkey. Pork trimmed of fat. All fish and seafood. Eggs. Dried beans, peas, or lentils. Unsalted nuts and seeds. Unsalted canned beans. Dairy Low-fat dairy products, such as skim or 1% milk, 2% or reduced-fat cheeses, low-fat ricotta or cottage cheese, or plain low-fat yogurt. Low-sodium or reduced-sodium cheeses. Fats and Oils Tub margarines without trans fats. Light or reduced-fat mayonnaise and salad dressings (reduced sodium). Avocado. Safflower, olive, or canola oils. Natural peanut or almond butter. Other Unsalted popcorn and pretzels. The items listed above may not be a complete list of recommended foods or beverages. Contact your dietitian for more options. WHAT FOODS ARE NOT RECOMMENDED? Grains White bread.   White pasta. White rice. Refined cornbread. Bagels and croissants. Crackers that contain trans fat. Vegetables Creamed or fried vegetables. Vegetables in a cheese sauce. Regular canned vegetables. Regular canned tomato sauce and paste. Regular tomato and vegetable juices. Fruits Dried fruits. Canned fruit in light or heavy syrup. Fruit juice. Meat and Other Protein Products Fatty cuts of meat. Ribs, chicken wings, bacon, sausage, bologna, salami, chitterlings, fatback, hot dogs, bratwurst, and packaged luncheon meats. Salted nuts and seeds. Canned beans with salt. Dairy Whole or 2% milk, cream, half-and-half, and cream cheese. Whole-fat or sweetened yogurt. Full-fat cheeses or blue cheese. Nondairy creamers and whipped  toppings. Processed cheese, cheese spreads, or cheese curds. Condiments Onion and garlic salt, seasoned salt, table salt, and sea salt. Canned and packaged gravies. Worcestershire sauce. Tartar sauce. Barbecue sauce. Teriyaki sauce. Soy sauce, including reduced sodium. Steak sauce. Fish sauce. Oyster sauce. Cocktail sauce. Horseradish. Ketchup and mustard. Meat flavorings and tenderizers. Bouillon cubes. Hot sauce. Tabasco sauce. Marinades. Taco seasonings. Relishes. Fats and Oils Butter, stick margarine, lard, shortening, ghee, and bacon fat. Coconut, palm kernel, or palm oils. Regular salad dressings. Other Pickles and olives. Salted popcorn and pretzels. The items listed above may not be a complete list of foods and beverages to avoid. Contact your dietitian for more information. WHERE CAN I FIND MORE INFORMATION? National Heart, Lung, and Blood Institute: www.nhlbi.nih.gov/health/health-topics/topics/dash/ Document Released: 06/06/2011 Document Revised: 11/01/2013 Document Reviewed: 04/21/2013 ExitCare Patient Information 2015 [employer]. This information is not intended to replace advice given to you by your health care provider. Make sure you discuss any questions you have with your health care provider. Hypertension Hypertension, commonly called high blood pressure, is when the force of blood pumping through your arteries is too strong. Your arteries are the blood vessels that carry blood from your heart throughout your body. A blood pressure reading consists of a higher number over a lower number, such as 110/72. The higher number (systolic) is the pressure inside your arteries when your heart pumps. The lower number (diastolic) is the pressure inside your arteries when your heart relaxes. Ideally you want your blood pressure below 120/80. Hypertension forces your heart to work harder to pump blood. Your arteries may become narrow or stiff. Having hypertension puts you at risk for heart  disease, stroke, and other problems.  RISK FACTORS Some risk factors for high blood pressure are controllable. Others are not.  Risk factors you cannot control include:   Race. You may be at higher risk if you are African American.  Age. Risk increases with age.  Gender. Men are at higher risk than women before age 45 years. After age 65, women are at higher risk than men. Risk factors you can control include:  Not getting enough exercise or physical activity.  Being overweight.  Getting too much fat, sugar, calories, or salt in your diet.  Drinking too much alcohol. SIGNS AND SYMPTOMS Hypertension does not usually cause signs or symptoms. Extremely high blood pressure (hypertensive crisis) may cause headache, anxiety, shortness of breath, and nosebleed. DIAGNOSIS  To check if you have hypertension, your health care provider will measure your blood pressure while you are seated, with your arm held at the level of your heart. It should be measured at least twice using the same arm. Certain conditions can cause a difference in blood pressure between your right and left arms. A blood pressure reading that is higher than normal on one occasion does not mean that you need treatment. If one blood   pressure reading is high, ask your health care provider about having it checked again. TREATMENT  Treating high blood pressure includes making lifestyle changes and possibly taking medicine. Living a healthy lifestyle can help lower high blood pressure. You may need to change some of your habits. Lifestyle changes may include:  Following the DASH diet. This diet is high in fruits, vegetables, and whole grains. It is low in salt, red meat, and added sugars.  Getting at least 2 hours of brisk physical activity every week.  Losing weight if necessary.  Not smoking.  Limiting alcoholic beverages.  Learning ways to reduce stress. If lifestyle changes are not enough to get your blood pressure  under control, your health care provider may prescribe medicine. You may need to take more than one. Work closely with your health care provider to understand the risks and benefits. HOME CARE INSTRUCTIONS  Have your blood pressure rechecked as directed by your health care provider.   Take medicines only as directed by your health care provider. Follow the directions carefully. Blood pressure medicines must be taken as prescribed. The medicine does not work as well when you skip doses. Skipping doses also puts you at risk for problems.   Do not smoke.   Monitor your blood pressure at home as directed by your health care provider. SEEK MEDICAL CARE IF:   You think you are having a reaction to medicines taken.  You have recurrent headaches or feel dizzy.  You have swelling in your ankles.  You have trouble with your vision. SEEK IMMEDIATE MEDICAL CARE IF:  You develop a severe headache or confusion.  You have unusual weakness, numbness, or feel faint.  You have severe chest or abdominal pain.  You vomit repeatedly.  You have trouble breathing. MAKE SURE YOU:   Understand these instructions.  Will watch your condition.  Will get help right away if you are not doing well or get worse. Document Released: 06/17/2005 Document Revised: 11/01/2013 Document Reviewed: 04/09/2013 ExitCare Patient Information 2015 [employer]. This information is not intended to replace advice given to you by your health care provider. Make sure you discuss any questions you have with your health care provider.  

## 2014-03-23 ENCOUNTER — Telehealth: Payer: Self-pay | Admitting: Internal Medicine

## 2014-03-23 NOTE — Telephone Encounter (Signed)
Patient said he doesn't need any medication refill. Also, he is asking for an eye doctor referral. Please f/u with Patient.

## 2014-04-08 NOTE — Telephone Encounter (Signed)
Referral placed.

## 2014-07-11 ENCOUNTER — Observation Stay (HOSPITAL_COMMUNITY)
Admission: EM | Admit: 2014-07-11 | Discharge: 2014-07-12 | Disposition: A | Discharge status: Home / Self Care | Payer: Medicaid Other | Attending: Internal Medicine | Admitting: Internal Medicine

## 2014-07-11 ENCOUNTER — Emergency Department (HOSPITAL_COMMUNITY): Payer: Medicaid Other

## 2014-07-11 ENCOUNTER — Encounter (HOSPITAL_COMMUNITY): Payer: Self-pay | Admitting: Emergency Medicine

## 2014-07-11 DIAGNOSIS — E876 Hypokalemia: Secondary | ICD-10-CM | POA: Insufficient documentation

## 2014-07-11 DIAGNOSIS — K859 Acute pancreatitis without necrosis or infection, unspecified: Secondary | ICD-10-CM | POA: Diagnosis present

## 2014-07-11 DIAGNOSIS — I252 Old myocardial infarction: Secondary | ICD-10-CM | POA: Insufficient documentation

## 2014-07-11 DIAGNOSIS — M545 Low back pain: Secondary | ICD-10-CM | POA: Diagnosis not present

## 2014-07-11 DIAGNOSIS — R109 Unspecified abdominal pain: Secondary | ICD-10-CM | POA: Insufficient documentation

## 2014-07-11 DIAGNOSIS — I712 Thoracic aortic aneurysm, without rupture, unspecified: Secondary | ICD-10-CM

## 2014-07-11 DIAGNOSIS — R0602 Shortness of breath: Secondary | ICD-10-CM

## 2014-07-11 DIAGNOSIS — Z888 Allergy status to other drugs, medicaments and biological substances status: Secondary | ICD-10-CM | POA: Diagnosis not present

## 2014-07-11 DIAGNOSIS — R079 Chest pain, unspecified: Secondary | ICD-10-CM

## 2014-07-11 DIAGNOSIS — R0789 Other chest pain: Secondary | ICD-10-CM | POA: Diagnosis not present

## 2014-07-11 DIAGNOSIS — I700 Atherosclerosis of aorta: Secondary | ICD-10-CM | POA: Insufficient documentation

## 2014-07-11 DIAGNOSIS — K5730 Diverticulosis of large intestine without perforation or abscess without bleeding: Secondary | ICD-10-CM | POA: Insufficient documentation

## 2014-07-11 DIAGNOSIS — I10 Essential (primary) hypertension: Secondary | ICD-10-CM

## 2014-07-11 DIAGNOSIS — I160 Hypertensive urgency: Secondary | ICD-10-CM

## 2014-07-11 DIAGNOSIS — F1020 Alcohol dependence, uncomplicated: Secondary | ICD-10-CM | POA: Diagnosis not present

## 2014-07-11 DIAGNOSIS — G8929 Other chronic pain: Secondary | ICD-10-CM | POA: Insufficient documentation

## 2014-07-11 DIAGNOSIS — F17210 Nicotine dependence, cigarettes, uncomplicated: Secondary | ICD-10-CM | POA: Insufficient documentation

## 2014-07-11 LAB — CBC WITH DIFFERENTIAL/PLATELET
Basophils Absolute: 0 10*3/uL (ref 0.0–0.1)
Basophils Relative: 0 % (ref 0–1)
Eosinophils Absolute: 0.1 10*3/uL (ref 0.0–0.7)
Eosinophils Relative: 2 % (ref 0–5)
HCT: 53.6 % — ABNORMAL HIGH (ref 39.0–52.0)
Hemoglobin: 18.4 g/dL — ABNORMAL HIGH (ref 13.0–17.0)
Lymphocytes Relative: 45 % (ref 12–46)
Lymphs Abs: 2.6 10*3/uL (ref 0.7–4.0)
MCH: 31.7 pg (ref 26.0–34.0)
MCHC: 34.3 g/dL (ref 30.0–36.0)
MCV: 92.4 fL (ref 78.0–100.0)
Monocytes Absolute: 0.3 10*3/uL (ref 0.1–1.0)
Monocytes Relative: 6 % (ref 3–12)
Neutro Abs: 2.6 10*3/uL (ref 1.7–7.7)
Neutrophils Relative %: 47 % (ref 43–77)
Platelets: 218 10*3/uL (ref 150–400)
RBC: 5.8 MIL/uL (ref 4.22–5.81)
RDW: 13.9 % (ref 11.5–15.5)
WBC: 5.6 10*3/uL (ref 4.0–10.5)

## 2014-07-11 LAB — BASIC METABOLIC PANEL
Anion gap: 9 (ref 5–15)
BUN: 10 mg/dL (ref 6–23)
CO2: 22 mmol/L (ref 19–32)
Calcium: 9.4 mg/dL (ref 8.4–10.5)
Chloride: 109 mEq/L (ref 96–112)
Creatinine, Ser: 0.95 mg/dL (ref 0.50–1.35)
GFR calc Af Amer: >90 mL/min (ref >90)
GFR calc non Af Amer: 88 mL/min — ABNORMAL LOW (ref >90)
Glucose, Bld: 116 mg/dL — ABNORMAL HIGH (ref 70–99)
Potassium: 3.2 mmol/L — ABNORMAL LOW (ref 3.5–5.1)
Sodium: 140 mmol/L (ref 135–145)

## 2014-07-11 LAB — HEPATIC FUNCTION PANEL
ALT: 12 U/L (ref 0–53)
AST: 14 U/L (ref 0–37)
Albumin: 3.5 g/dL (ref 3.5–5.2)
Alkaline Phosphatase: 76 U/L (ref 39–117)
Bilirubin, Direct: <0.1 mg/dL (ref 0.0–0.3)
Total Bilirubin: 0.3 mg/dL (ref 0.3–1.2)
Total Protein: 5.8 g/dL — ABNORMAL LOW (ref 6.0–8.3)

## 2014-07-11 LAB — LIPASE, BLOOD: Lipase: 49 U/L (ref 11–59)

## 2014-07-11 LAB — TROPONIN I: Troponin I: <0.03 ng/mL (ref <0.031)

## 2014-07-11 LAB — I-STAT TROPONIN, ED: Troponin i, poc: 0.02 ng/mL (ref 0.00–0.08)

## 2014-07-11 LAB — BRAIN NATRIURETIC PEPTIDE: B Natriuretic Peptide: 14.6 pg/mL (ref 0.0–100.0)

## 2014-07-11 LAB — D-DIMER, QUANTITATIVE: D-Dimer, Quant: 1.36 ug/mL-FEU — ABNORMAL HIGH (ref 0.00–0.48)

## 2014-07-11 MED ORDER — ATENOLOL 25 MG PO TABS
50.0000 mg | ORAL_TABLET | Freq: Once | ORAL | Status: AC
  Administered 2014-07-11: 50 mg via ORAL
  Filled 2014-07-11: qty 2

## 2014-07-11 MED ORDER — AMLODIPINE BESYLATE 10 MG PO TABS
10.0000 mg | ORAL_TABLET | Freq: Every day | ORAL | Status: DC
  Administered 2014-07-12: 10 mg via ORAL
  Filled 2014-07-11: qty 1

## 2014-07-11 MED ORDER — INFLUENZA VAC SPLIT QUAD 0.5 ML IM SUSY
0.5000 mL | PREFILLED_SYRINGE | INTRAMUSCULAR | Status: AC
  Administered 2014-07-12: 0.5 mL via INTRAMUSCULAR
  Filled 2014-07-11: qty 0.5

## 2014-07-11 MED ORDER — NITROGLYCERIN 0.4 MG/HR TD PT24
0.4000 mg | MEDICATED_PATCH | Freq: Every day | TRANSDERMAL | Status: DC
  Filled 2014-07-11: qty 1

## 2014-07-11 MED ORDER — ENOXAPARIN SODIUM 40 MG/0.4ML ~~LOC~~ SOLN
40.0000 mg | SUBCUTANEOUS | Status: DC
  Administered 2014-07-12: 40 mg via SUBCUTANEOUS
  Filled 2014-07-11: qty 0.4

## 2014-07-11 MED ORDER — SODIUM CHLORIDE 0.9 % IV SOLN: INTRAVENOUS | Status: DC

## 2014-07-11 MED ORDER — ONDANSETRON HCL 4 MG/2ML IJ SOLN: 4.0000 mg | Freq: Four times a day (QID) | INTRAMUSCULAR | Status: DC | PRN

## 2014-07-11 MED ORDER — ASPIRIN EC 325 MG PO TBEC
325.0000 mg | DELAYED_RELEASE_TABLET | Freq: Every day | ORAL | Status: DC
  Administered 2014-07-12: 325 mg via ORAL
  Filled 2014-07-11: qty 1

## 2014-07-11 MED ORDER — AMLODIPINE BESYLATE 5 MG PO TABS
10.0000 mg | ORAL_TABLET | Freq: Once | ORAL | Status: AC
  Administered 2014-07-11: 10 mg via ORAL
  Filled 2014-07-11: qty 2

## 2014-07-11 MED ORDER — HYDROCHLOROTHIAZIDE 25 MG PO TABS
25.0000 mg | ORAL_TABLET | Freq: Every day | ORAL | Status: DC
  Administered 2014-07-12: 25 mg via ORAL
  Filled 2014-07-11: qty 1

## 2014-07-11 MED ORDER — MORPHINE SULFATE 2 MG/ML IJ SOLN: 2.0000 mg | INTRAMUSCULAR | Status: DC | PRN

## 2014-07-11 MED ORDER — NITROGLYCERIN 2 % TD OINT
1.0000 [in_us] | TOPICAL_OINTMENT | Freq: Once | TRANSDERMAL | Status: AC
  Administered 2014-07-11: 1 [in_us] via TOPICAL
  Filled 2014-07-11: qty 1

## 2014-07-11 MED ORDER — LABETALOL HCL 5 MG/ML IV SOLN
10.0000 mg | Freq: Once | INTRAVENOUS | Status: AC
  Administered 2014-07-11: 10 mg via INTRAVENOUS
  Filled 2014-07-11: qty 4

## 2014-07-11 MED ORDER — POTASSIUM CHLORIDE CRYS ER 20 MEQ PO TBCR
40.0000 meq | EXTENDED_RELEASE_TABLET | Freq: Once | ORAL | Status: AC
  Administered 2014-07-11: 40 meq via ORAL
  Filled 2014-07-11: qty 2

## 2014-07-11 MED ORDER — ACETAMINOPHEN 325 MG PO TABS: 650.0000 mg | ORAL_TABLET | ORAL | Status: DC | PRN

## 2014-07-11 MED ORDER — IOHEXOL 350 MG/ML SOLN
100.0000 mL | Freq: Once | INTRAVENOUS | Status: AC | PRN
  Administered 2014-07-11: 100 mL via INTRAVENOUS

## 2014-07-11 MED ORDER — HYDRALAZINE HCL 20 MG/ML IJ SOLN
10.0000 mg | INTRAMUSCULAR | Status: DC | PRN
  Administered 2014-07-12: 10 mg via INTRAVENOUS
  Filled 2014-07-11: qty 1

## 2014-07-11 MED ORDER — SODIUM CHLORIDE 0.9 % IV SOLN
INTRAVENOUS | Status: DC
  Administered 2014-07-12 (×2): via INTRAVENOUS

## 2014-07-11 MED ORDER — ATENOLOL 50 MG PO TABS
50.0000 mg | ORAL_TABLET | Freq: Every day | ORAL | Status: DC
  Administered 2014-07-12: 50 mg via ORAL
  Filled 2014-07-11: qty 1

## 2014-07-11 MED ORDER — HYDROCHLOROTHIAZIDE 25 MG PO TABS
25.0000 mg | ORAL_TABLET | Freq: Every day | ORAL | Status: DC
  Administered 2014-07-11: 25 mg via ORAL
  Filled 2014-07-11: qty 1

## 2014-07-11 MED ORDER — OXYCODONE-ACETAMINOPHEN 10-325 MG PO TABS: 1.0000 | ORAL_TABLET | Freq: Three times a day (TID) | ORAL | Status: DC | PRN

## 2014-07-11 MED ORDER — ASPIRIN EC 325 MG PO TBEC
325.0000 mg | DELAYED_RELEASE_TABLET | Freq: Once | ORAL | Status: AC
  Administered 2014-07-11: 325 mg via ORAL
  Filled 2014-07-11: qty 1

## 2014-07-11 NOTE — Progress Notes (Signed)
Received report from [place], RN in ED.

## 2014-07-11 NOTE — ED Notes (Signed)
Wanda from [facility] at bedside to discuss medication management with pt.

## 2014-07-11 NOTE — ED Notes (Signed)
Pt c/o left sided CP in rib area x 3 days with SOB

## 2014-07-11 NOTE — Progress Notes (Signed)
  CARE MANAGEMENT ED NOTE 07/11/2014  Patient:  Steven Delacruz, Steven Delacruz   Account Number:  [id number]  Date Initiated:  07/11/2014  Documentation initiated by:  Laurena Slimmer  Subjective/Objective Assessment:     Subjective/Objective Assessment Detail:   Patient is a 64 year old male with a 40-pack-year history of smoking, hypertension, MI, and abdominal atherosclerosis who presents emergency room for evaluation of chest pain. Patient states for the past 4 days he has been having left-sided and substernal chest pain with occasional sharp stabbing episodes. PCP Dr. Doreene Burke at the [facility].     Action/Plan:   Action/Plan Detail:   Follow up appt  Referral for the [facility]   Anticipated DC Date:  07/11/2014     Status Recommendation to Physician:   Result of Recommendation:  Agreed  Other ED Services  Consult [facility] [place]  CM consult  Follow-up appt scheduled  Outpatient Services - Pt will follow up    Choice offered to / List presented to:            Status of service:  Completed, signed off  ED Comments:   ED Comments Detail:  ED CM consulted by Hassan Rowan RN concerning patient needing medication assistance. CM revieweed record, patient is active with [facility],  Insured byMedicaid. Met with patient at bedside, he reports being a [facility] at [facility] but [facility]. [facility]iscussed that patient should contact [facility] pharmacy regading having prescriptions transfered to [facility] pharmacy for more affordable pricing. Patient verbalized understanding. [facility] concerning transfering prescription. Discussed disposition plan with Hassan Rowan RN on Pod A.  No further CM needs identified

## 2014-07-11 NOTE — Progress Notes (Signed)
Pt arrived to unit via stretcher alert and oriented x4. Oriented to room, unit, and staff.  Bed in lowest position and call bell is within reach. Will continue to monitor.

## 2014-07-11 NOTE — H&P (Signed)
[facility] History and Physical  Steven Delacruz FKC:[id number] DOB: [birth year] DOA: 07/11/2014  Referring physician: ER physician. PCP: Angelica Chessman, MD  Chief Complaint: Chest pain.  HPI: Steven Delacruz is a 64 y.o. male history of hypertension, chronic back pain, tobacco abuse and alcoholism presents to the ER because of chest pain. Patient states he's been having chest pain for last 2-3 days. Pain is mostly in the left anterior chest wall pressure-like with mild shortness of breath. Initially on presentation patient's blood pressure was high more than 749 systolic. Patient had a CT angiogram of the chest and abdomen to rule out dissection which was negative but did show focal pancreatitis. Lipase levels are normal. Patient states he did have some abdominal discomfort 3-4 days ago which lasted for around 48 hours but did not have any nausea vomiting. Patient has had previous history of pancreatitis secondary to alcoholism. Patient states that he drinks 3-4 times a week. Patient's symptoms largely improved after blood pressure was controlled and nitroglycerin patch was placed. Patient is presently chest pain-free. Cardiac markers were negative and EKG was unremarkable. Patient will be admitted for further observation and workup for chest pain given the patient's chest pain improved with nitroglycerin patch.   Review of Systems: As presented in the history of presenting illness, rest negative.  Past Medical History  Diagnosis Date  . Hypertension   . Chronic back pain   . Right shoulder pain   . Myocardial infarction   . Headache(784.0)   . Abnormal CT scan, pancreas - ? focal pancreatitis  05/13/2013  . Abdominal aortic atherosclerosis 05/13/2013   Past Surgical History  Procedure Laterality Date  . No past surgeries     Social History:  reports that he has been smoking Cigarettes.  He has been smoking about 0.25 packs per day. He has never used smokeless tobacco. He  reports that he drinks about 2.4 oz of alcohol per week. He reports that he does not use illicit drugs. Where does patient live home. Can patient participate in ADLs? Yes.  Allergies  Allergen Reactions  . Lisinopril Swelling    Family History: History reviewed. No pertinent family history.    Prior to Admission medications   Medication Sig Start Date End Date Taking? Authorizing Provider  amLODipine (NORVASC) 10 MG tablet Take 1 tablet (10 mg total) by mouth daily. 12/21/13  Yes Tresa Garter, MD  atenolol (TENORMIN) 50 MG tablet Take 2 tablets (100 mg total) by mouth daily. Patient taking differently: Take 50 mg by mouth daily.  12/21/13  Yes Tresa Garter, MD  hydrochlorothiazide (HYDRODIURIL) 25 MG tablet Take 1 tablet (25 mg total) by mouth daily. 12/21/13  Yes Tresa Garter, MD  oxyCODONE-acetaminophen (PERCOCET) 10-325 MG per tablet Take 1 tablet by mouth every 8 (eight) hours as needed. 06/28/14  Yes Historical Provider, MD  tadalafil (CIALIS) 20 MG tablet Take 0.5-1 tablets (10-20 mg total) by mouth every other day as needed for erectile dysfunction. 12/21/13  Yes Tresa Garter, MD  acetaminophen-codeine (TYLENOL #3) 300-30 MG per tablet Take 1 tablet by mouth every 4 (four) hours as needed. 03/22/14   Tresa Garter, MD    Physical Exam: Filed Vitals:   07/11/14 2200 07/11/14 2230 07/11/14 2316 07/11/14 2333  BP: 157/93 164/101  165/99  Pulse: 63 64  65  Temp:    98.1 F (36.7 C)  TempSrc:    Oral  Resp: 14 15  12   Height:  6' (1.829 m)   Weight:   100.2 kg (220 lb 14.4 oz)   SpO2: 96% 97%  97%     General:  Well-developed and nourished.  Eyes: Anicteric no pallor. Mildly congested.  ENT: No discharge from the ears eyes nose or mouth.  Neck: No mass felt. No JVD appreciated.  Cardiovascular: S1 and S2 heard.  Respiratory: No rhonchi or crepitations.  Abdomen: Soft nontender bowel sounds present.  Skin: No rash.  Musculoskeletal:  No edema.  Psychiatric: Appears normal.  Neurologic: Alert awake oriented to time place and person. Moves all extremities.  Labs on Admission:  Basic Metabolic Panel:  Recent Labs Lab 07/11/14 1255  NA 140  K 3.2*  CL 109  CO2 22  GLUCOSE 116*  BUN 10  CREATININE 0.95  CALCIUM 9.4   Liver Function Tests:  Recent Labs Lab 07/11/14 2108  AST 14  ALT 12  ALKPHOS 76  BILITOT 0.3  PROT 5.8*  ALBUMIN 3.5    Recent Labs Lab 07/11/14 2108  LIPASE 49   No results for input(s): AMMONIA in the last 168 hours. CBC:  Recent Labs Lab 07/11/14 1255  WBC 5.6  NEUTROABS 2.6  HGB 18.4*  HCT 53.6*  MCV 92.4  PLT 218   Cardiac Enzymes:  Recent Labs Lab 07/11/14 1925  TROPONINI <0.03    BNP (last 3 results) No results for input(s): PROBNP in the last 8760 hours. CBG: No results for input(s): GLUCAP in the last 168 hours.  Radiological Exams on Admission: Dg Chest 2 View  07/11/2014   CLINICAL DATA:  Acute chest pain.  EXAM: CHEST  2 VIEW  COMPARISON:  December 06, 2013.  FINDINGS: The heart size and mediastinal contours are within normal limits. No pneumothorax or pleural effusion is noted. Stable mild scarring is noted throughout both lungs. No acute pulmonary disease is noted. The visualized skeletal structures are unremarkable.  IMPRESSION: No acute cardiopulmonary abnormality seen.   Electronically Signed   By: Sabino Dick M.D.   On: 07/11/2014 13:21   Ct Angio Chest Aorta W/cm &/or Wo/cm  07/11/2014   CLINICAL DATA:  Chest pain and left-sided abdominal pain for 4 days.  EXAM: CT ANGIOGRAPHY CHEST AND ABDOMEN  TECHNIQUE: Multidetector CT imaging of the chest and abdomen was performed using the standard protocol during bolus administration of intravenous contrast. Multiplanar CT image reconstructions and MIPs were obtained to evaluate the vascular anatomy.  CONTRAST:  185mL OMNIPAQUE IOHEXOL 350 MG/ML SOLN  COMPARISON:  Chest x-ray dated 07/11/2014 and chest CT dated  07/06/2010 and CT abdomen dated 05/13/2013  FINDINGS: CTA CHEST FINDINGS  There is focal slight dilatation of the proximal descending thoracic aorta to a diameter of 3.5 cm. There is no aortic dissection. There is no aneurysmal dilatation of the aortic root. Heart size is normal. There are no pulmonary emboli.  There is severe emphysematous disease in the upper lobes. No infiltrates or effusions. No acute osseous abnormalities.  Review of the MIP images confirms the above findings.  CTA ABDOMEN FINDINGS  There is no aortic dissection or aneurysm. There is calcification and slight atheromatous disease in the distal abdominal aorta with tortuosity and calcification of the common iliac arteries. There is slight narrowing of the origin of the celiac axis. Superior mesenteric artery is widely patent. Moderate stenosis of the origin of the inferior mesenteric artery. Single widely patent right renal artery. Two widely patent left renal arteries.  The followup patient has a focal area of abnormal  lucency in the tail of the pancreas consistent with focal pancreatitis with soft tissue stranding in the adjacent tissues extending to the adjacent fourth portion of the duodenum. No visible pseudocyst.  Liver, biliary tree, spleen, and kidneys appear normal. There is bilateral stable adrenal hyperplasia.  There are numerous diverticula in the colon without diverticulitis. Prostate gland is not enlarged. The bladder is almost empty. No adenopathy or mass lesions. No free air or free fluid. No acute osseous abnormality.  Review of the MIP images confirms the above findings.  IMPRESSION: 1. Focal pancreatitis of the tail of the pancreas with inflammation in the adjacent soft tissues and in the fourth portion of the duodenum. 2. No evidence of aortic dissection. Focal slight dilatation of the proximal descending thoracic aorta at to a diameter of 3.4 cm. Recommend annual imaging followup by CTA or MRA. This recommendation follows  2010 ACCF/AHA/AATS/ACR/ASA/SCA/SCAI/SIR/STS/SVM Guidelines for the Diagnosis and Management of Patients with Thoracic Aortic Disease. Circulation.2010; 121: Q947-M546 3. Extensive diverticulosis of the colon.   Electronically Signed   By: Rozetta Nunnery M.D.   On: 07/11/2014 20:59   Ct Angio Abd/pel W/ And/or W/o  07/11/2014   CLINICAL DATA:  Chest pain and left-sided abdominal pain for 4 days.  EXAM: CT ANGIOGRAPHY CHEST AND ABDOMEN  TECHNIQUE: Multidetector CT imaging of the chest and abdomen was performed using the standard protocol during bolus administration of intravenous contrast. Multiplanar CT image reconstructions and MIPs were obtained to evaluate the vascular anatomy.  CONTRAST:  175mL OMNIPAQUE IOHEXOL 350 MG/ML SOLN  COMPARISON:  Chest x-ray dated 07/11/2014 and chest CT dated 07/06/2010 and CT abdomen dated 05/13/2013  FINDINGS: CTA CHEST FINDINGS  There is focal slight dilatation of the proximal descending thoracic aorta to a diameter of 3.5 cm. There is no aortic dissection. There is no aneurysmal dilatation of the aortic root. Heart size is normal. There are no pulmonary emboli.  There is severe emphysematous disease in the upper lobes. No infiltrates or effusions. No acute osseous abnormalities.  Review of the MIP images confirms the above findings.  CTA ABDOMEN FINDINGS  There is no aortic dissection or aneurysm. There is calcification and slight atheromatous disease in the distal abdominal aorta with tortuosity and calcification of the common iliac arteries. There is slight narrowing of the origin of the celiac axis. Superior mesenteric artery is widely patent. Moderate stenosis of the origin of the inferior mesenteric artery. Single widely patent right renal artery. Two widely patent left renal arteries.  The followup patient has a focal area of abnormal lucency in the tail of the pancreas consistent with focal pancreatitis with soft tissue stranding in the adjacent tissues extending to the  adjacent fourth portion of the duodenum. No visible pseudocyst.  Liver, biliary tree, spleen, and kidneys appear normal. There is bilateral stable adrenal hyperplasia.  There are numerous diverticula in the colon without diverticulitis. Prostate gland is not enlarged. The bladder is almost empty. No adenopathy or mass lesions. No free air or free fluid. No acute osseous abnormality.  Review of the MIP images confirms the above findings.  IMPRESSION: 1. Focal pancreatitis of the tail of the pancreas with inflammation in the adjacent soft tissues and in the fourth portion of the duodenum. 2. No evidence of aortic dissection. Focal slight dilatation of the proximal descending thoracic aorta at to a diameter of 3.4 cm. Recommend annual imaging followup by CTA or MRA. This recommendation follows 2010 ACCF/AHA/AATS/ACR/ASA/SCA/SCAI/SIR/STS/SVM Guidelines for the Diagnosis and Management of Patients with  Thoracic Aortic Disease. Circulation.2010; 121: M768-G881 3. Extensive diverticulosis of the colon.   Electronically Signed   By: Rozetta Nunnery M.D.   On: 07/11/2014 20:59    EKG: Independently reviewed. Normal sinus rhythm.  Assessment/Plan Principal Problem:   Chest pain Active Problems:   Pancreatitis   Accelerated hypertension   1. Chest pain - given that patient's chest pain improved with control of blood pressure and nitroglycerin patch at this time we will cycle cardiac markers to rule out ACS. Aspirin. Keep patient nothing by mouth in name in anticipation of possible cardiac procedures. Check 2-D echo. Patient's d-dimer is mildly elevated and since patient already had contrast for the CT angiogram we will check VQ scan. Patient states he has never taken Cialis though it is stated in his medication list. Check urine drug screen. 2. Accelerated hypertension - probably causing patient's chest discomfort. Patient's blood pressure improved with nitroglycerin patch which will be continued and continue with  home medications. 3. Pancreatitis - per the CAT scan. May also be contributing to #1. Lipase has been normal. Patient drinks alcohol and has had previous history of alcoholic pancreatitis. Patient advised to stop drinking. Check lipid panel for triglycerides. Patient at this time states he wants to eat and has good appetite and his abdomen is not bothering him. 4. Tobacco abuse and alcohol abuse - patient advised to quit tobacco abuse and alcohol abuse. Patient placed on alcohol withdrawal protocol. 5. Descending thoracic aortic dilation - will need yearly follow-up. 6. Chronic low back pain - continue home medications.   DVT Prophylaxis Lovenox.  Code Status: Full code.  Family Communication: None.  Disposition Plan: Admit for observation.    KAKRAKANDY,ARSHAD N. [facility] Pager [phone].  If 7PM-7AM, please contact night-coverage [email or web address] Password [facility] 07/11/2014, 11:49 PM

## 2014-07-11 NOTE — ED Notes (Signed)
Spoke with case management RE pt running out of blood pressure medications.  CM stated they will speak with pt shortly.

## 2014-07-11 NOTE — ED Provider Notes (Signed)
CSN: [id number]     Arrival date & time 07/11/14  1241 History   First MD Initiated Contact with Patient 07/11/14 1625     Chief Complaint  Patient presents with  . Chest Pain  . Shortness of Breath   HPI  Patient is a 64 year old male with a 40-pack-year history of smoking, hypertension, MI, and abdominal atherosclerosis who presents emergency room for evaluation of chest pain. Patient states for the past 4 days he has been having left-sided and substernal chest pain with occasional sharp stabbing episodes. He states that nothing seems to relieve for aggravate his pain. He does not feel any worsening when he is up and walking around. Tried no relieving factors at this time at home for his pain. Patient states that he has had chest pain in the past. He feels that this is typical of that chest pain that he has had in the past. Patient also states that he has a history of hypertension and has been out of his medications for several days. He is followed by a PCP at the [facility] [place]. He denies nausea, vomiting, numbness, PND, orthopnea, leg swelling. He does report worsening shortness of breath. He states that he can walk 2 blocks and do shopping without shortness of breath. Patient denies a family history of sudden cardiac deaths. Patient denies history of hyperlipidemia.  Past Medical History  Diagnosis Date  . Hypertension   . Chronic back pain   . Right shoulder pain   . Myocardial infarction   . Headache(784.0)   . Abnormal CT scan, pancreas - ? focal pancreatitis  05/13/2013  . Abdominal aortic atherosclerosis 05/13/2013   Past Surgical History  Procedure Laterality Date  . No past surgeries     History reviewed. No pertinent family history. History  Substance Use Topics  . Smoking status: Current Every Day Smoker -- 0.25 packs/day    Types: Cigarettes  . Smokeless tobacco: Never Used  . Alcohol Use: 2.4 oz/week    4 Cans of beer per week    Review of  Systems  Constitutional: Negative for fever, chills and fatigue.  Respiratory: Positive for shortness of breath. Negative for cough, chest tightness and wheezing.   Cardiovascular: Positive for chest pain. Negative for palpitations and leg swelling.  Gastrointestinal: Negative for nausea, vomiting, abdominal pain, diarrhea and constipation.  Genitourinary: Negative for dysuria, urgency, frequency, hematuria and difficulty urinating.  Musculoskeletal: Negative for back pain.  All other systems reviewed and are negative.     Allergies  Lisinopril  Home Medications   Prior to Admission medications   Medication Sig Start Date End Date Taking? Authorizing Provider  amLODipine (NORVASC) 10 MG tablet Take 1 tablet (10 mg total) by mouth daily. 12/21/13  Yes Tresa Garter, MD  atenolol (TENORMIN) 50 MG tablet Take 2 tablets (100 mg total) by mouth daily. Patient taking differently: Take 50 mg by mouth daily.  12/21/13  Yes Tresa Garter, MD  hydrochlorothiazide (HYDRODIURIL) 25 MG tablet Take 1 tablet (25 mg total) by mouth daily. 12/21/13  Yes Tresa Garter, MD  oxyCODONE-acetaminophen (PERCOCET) 10-325 MG per tablet Take 1 tablet by mouth every 8 (eight) hours as needed. 06/28/14  Yes Historical Provider, MD  tadalafil (CIALIS) 20 MG tablet Take 0.5-1 tablets (10-20 mg total) by mouth every other day as needed for erectile dysfunction. 12/21/13  Yes Tresa Garter, MD  acetaminophen-codeine (TYLENOL #3) 300-30 MG per tablet Take 1 tablet by mouth every 4 (  four) hours as needed. 03/22/14   Tresa Garter, MD   BP 157/93 mmHg  Pulse 63  Temp(Src) 97.6 F (36.4 C) (Oral)  Resp 14  SpO2 96% Physical Exam  Constitutional: He is oriented to person, place, and time. He appears well-developed and well-nourished. No distress.  HENT:  Head: Normocephalic and atraumatic.  Mouth/Throat: Oropharynx is clear and moist. No oropharyngeal exudate.  Eyes: Conjunctivae and EOM are  normal. Pupils are equal, round, and reactive to light. No scleral icterus.  Neck: Normal range of motion. Neck supple. No JVD present. No thyromegaly present.  Cardiovascular: Normal rate, regular rhythm, normal heart sounds and intact distal pulses.  Exam reveals no gallop and no friction rub.   No murmur heard. Pulmonary/Chest: Effort normal and breath sounds normal. No respiratory distress. He has no wheezes. He has no rales. He exhibits no tenderness.  Abdominal: Soft. Bowel sounds are normal. He exhibits no distension and no mass. There is no tenderness. There is no rebound and no guarding.  Musculoskeletal: Normal range of motion.  Lymphadenopathy:    He has no cervical adenopathy.  Neurological: He is alert and oriented to person, place, and time. He has normal strength. No cranial nerve deficit or sensory deficit.  Skin: Skin is warm and dry. He is not diaphoretic.  Psychiatric: He has a normal mood and affect. His behavior is normal. Judgment and thought content normal.  Nursing note and vitals reviewed.   ED Course  Procedures (including critical care time) Labs Review Labs Reviewed  BASIC METABOLIC PANEL - Abnormal; Notable for the following:    Potassium 3.2 (*)    Glucose, Bld 116 (*)    GFR calc non Af Amer 88 (*)    All other components within normal limits  CBC WITH DIFFERENTIAL - Abnormal; Notable for the following:    Hemoglobin 18.4 (*)    HCT 53.6 (*)    All other components within normal limits  D-DIMER, QUANTITATIVE - Abnormal; Notable for the following:    D-Dimer, Quant 1.36 (*)    All other components within normal limits  HEPATIC FUNCTION PANEL - Abnormal; Notable for the following:    Total Protein 5.8 (*)    All other components within normal limits  BRAIN NATRIURETIC PEPTIDE  TROPONIN I  LIPASE, BLOOD  I-STAT TROPOININ, ED    Imaging Review Dg Chest 2 View  07/11/2014   CLINICAL DATA:  Acute chest pain.  EXAM: CHEST  2 VIEW  COMPARISON:  December 06, 2013.  FINDINGS: The heart size and mediastinal contours are within normal limits. No pneumothorax or pleural effusion is noted. Stable mild scarring is noted throughout both lungs. No acute pulmonary disease is noted. The visualized skeletal structures are unremarkable.  IMPRESSION: No acute cardiopulmonary abnormality seen.   Electronically Signed   By: Sabino Dick M.D.   On: 07/11/2014 13:21   Ct Angio Chest Aorta W/cm &/or Wo/cm  07/11/2014   CLINICAL DATA:  Chest pain and left-sided abdominal pain for 4 days.  EXAM: CT ANGIOGRAPHY CHEST AND ABDOMEN  TECHNIQUE: Multidetector CT imaging of the chest and abdomen was performed using the standard protocol during bolus administration of intravenous contrast. Multiplanar CT image reconstructions and MIPs were obtained to evaluate the vascular anatomy.  CONTRAST:  127mL OMNIPAQUE IOHEXOL 350 MG/ML SOLN  COMPARISON:  Chest x-ray dated 07/11/2014 and chest CT dated 07/06/2010 and CT abdomen dated 05/13/2013  FINDINGS: CTA CHEST FINDINGS  There is focal slight dilatation of  the proximal descending thoracic aorta to a diameter of 3.5 cm. There is no aortic dissection. There is no aneurysmal dilatation of the aortic root. Heart size is normal. There are no pulmonary emboli.  There is severe emphysematous disease in the upper lobes. No infiltrates or effusions. No acute osseous abnormalities.  Review of the MIP images confirms the above findings.  CTA ABDOMEN FINDINGS  There is no aortic dissection or aneurysm. There is calcification and slight atheromatous disease in the distal abdominal aorta with tortuosity and calcification of the common iliac arteries. There is slight narrowing of the origin of the celiac axis. Superior mesenteric artery is widely patent. Moderate stenosis of the origin of the inferior mesenteric artery. Single widely patent right renal artery. Two widely patent left renal arteries.  The followup patient has a focal area of abnormal lucency in the  tail of the pancreas consistent with focal pancreatitis with soft tissue stranding in the adjacent tissues extending to the adjacent fourth portion of the duodenum. No visible pseudocyst.  Liver, biliary tree, spleen, and kidneys appear normal. There is bilateral stable adrenal hyperplasia.  There are numerous diverticula in the colon without diverticulitis. Prostate gland is not enlarged. The bladder is almost empty. No adenopathy or mass lesions. No free air or free fluid. No acute osseous abnormality.  Review of the MIP images confirms the above findings.  IMPRESSION: 1. Focal pancreatitis of the tail of the pancreas with inflammation in the adjacent soft tissues and in the fourth portion of the duodenum. 2. No evidence of aortic dissection. Focal slight dilatation of the proximal descending thoracic aorta at to a diameter of 3.4 cm. Recommend annual imaging followup by CTA or MRA. This recommendation follows 2010 ACCF/AHA/AATS/ACR/ASA/SCA/SCAI/SIR/STS/SVM Guidelines for the Diagnosis and Management of Patients with Thoracic Aortic Disease. Circulation.2010; 121: Z610-R604 3. Extensive diverticulosis of the colon.   Electronically Signed   By: Rozetta Nunnery M.D.   On: 07/11/2014 20:59   Ct Angio Abd/pel W/ And/or W/o  07/11/2014   CLINICAL DATA:  Chest pain and left-sided abdominal pain for 4 days.  EXAM: CT ANGIOGRAPHY CHEST AND ABDOMEN  TECHNIQUE: Multidetector CT imaging of the chest and abdomen was performed using the standard protocol during bolus administration of intravenous contrast. Multiplanar CT image reconstructions and MIPs were obtained to evaluate the vascular anatomy.  CONTRAST:  163mL OMNIPAQUE IOHEXOL 350 MG/ML SOLN  COMPARISON:  Chest x-ray dated 07/11/2014 and chest CT dated 07/06/2010 and CT abdomen dated 05/13/2013  FINDINGS: CTA CHEST FINDINGS  There is focal slight dilatation of the proximal descending thoracic aorta to a diameter of 3.5 cm. There is no aortic dissection. There is no  aneurysmal dilatation of the aortic root. Heart size is normal. There are no pulmonary emboli.  There is severe emphysematous disease in the upper lobes. No infiltrates or effusions. No acute osseous abnormalities.  Review of the MIP images confirms the above findings.  CTA ABDOMEN FINDINGS  There is no aortic dissection or aneurysm. There is calcification and slight atheromatous disease in the distal abdominal aorta with tortuosity and calcification of the common iliac arteries. There is slight narrowing of the origin of the celiac axis. Superior mesenteric artery is widely patent. Moderate stenosis of the origin of the inferior mesenteric artery. Single widely patent right renal artery. Two widely patent left renal arteries.  The followup patient has a focal area of abnormal lucency in the tail of the pancreas consistent with focal pancreatitis with soft tissue stranding in the  adjacent tissues extending to the adjacent fourth portion of the duodenum. No visible pseudocyst.  Liver, biliary tree, spleen, and kidneys appear normal. There is bilateral stable adrenal hyperplasia.  There are numerous diverticula in the colon without diverticulitis. Prostate gland is not enlarged. The bladder is almost empty. No adenopathy or mass lesions. No free air or free fluid. No acute osseous abnormality.  Review of the MIP images confirms the above findings.  IMPRESSION: 1. Focal pancreatitis of the tail of the pancreas with inflammation in the adjacent soft tissues and in the fourth portion of the duodenum. 2. No evidence of aortic dissection. Focal slight dilatation of the proximal descending thoracic aorta at to a diameter of 3.4 cm. Recommend annual imaging followup by CTA or MRA. This recommendation follows 2010 ACCF/AHA/AATS/ACR/ASA/SCA/SCAI/SIR/STS/SVM Guidelines for the Diagnosis and Management of Patients with Thoracic Aortic Disease. Circulation.2010; 121: H299-M426 3. Extensive diverticulosis of the colon.    Electronically Signed   By: Rozetta Nunnery M.D.   On: 07/11/2014 20:59     EKG Interpretation   Date/Time:  Monday July 11 2014 12:49:01 EST Ventricular Rate:  100 PR Interval:  158 QRS Duration: 90 QT Interval:  366 QTC Calculation: 472 R Axis:   -54 Text Interpretation:  Normal sinus rhythm Left anterior fascicular block  Abnormal ECG Since last tracing rate faster Confirmed by YAO  MD, DAVID  ([id number]) on 07/11/2014 6:22:04 PM      MDM   Final diagnoses:  Acute pancreatitis, unspecified pancreatitis type  Thoracic aortic aneurysm  Hypertensive urgency   Patient is a 65 year old male who presents emergency room for evaluation of chest pain and shortness of breath 4 days. Physical exam is unremarkable. EKG reveals normal sinus rhythm. Chest x-ray is negative. Troponin is negative. Delta troponin is negative. D-dimer was +1.36. BMP reveals mild hypokalemia which was replaced here. Lipase is normal. LFTs are normal. There is concern for possible aortic dissection given hypertension. CT angiogram of the chest and abdomen was performed which revealed no dissection. Patient does have a descending thoracic aortic aneurysm at 3.4 cm. CT also revealed acute pancreatitis. CT of the abdomen reveals diverticulosis but no other acute processes. Consult to the unassigned service and spoke with Dr. Chancy Milroy who will admit the patient to telemetry at this time.  Temporary admit orders were placed.  Patient seen by and discussed with Dr. Darl Householder who agrees with the above workup and plan.     Cherylann Parr, PA-C 07/11/14 2221  Wandra Arthurs, MD 07/11/14 2350

## 2014-07-12 DIAGNOSIS — I712 Thoracic aortic aneurysm, without rupture, unspecified: Secondary | ICD-10-CM | POA: Insufficient documentation

## 2014-07-12 DIAGNOSIS — R0602 Shortness of breath: Secondary | ICD-10-CM

## 2014-07-12 DIAGNOSIS — I369 Nonrheumatic tricuspid valve disorder, unspecified: Secondary | ICD-10-CM

## 2014-07-12 LAB — COMPREHENSIVE METABOLIC PANEL
ALT: 11 U/L (ref 0–53)
AST: 17 U/L (ref 0–37)
Albumin: 3.3 g/dL — ABNORMAL LOW (ref 3.5–5.2)
Alkaline Phosphatase: 76 U/L (ref 39–117)
Anion gap: 7 (ref 5–15)
BUN: 10 mg/dL (ref 6–23)
CO2: 26 mmol/L (ref 19–32)
Calcium: 9.1 mg/dL (ref 8.4–10.5)
Chloride: 105 mEq/L (ref 96–112)
Creatinine, Ser: 1.11 mg/dL (ref 0.50–1.35)
GFR calc Af Amer: 81 mL/min — ABNORMAL LOW (ref >90)
GFR calc non Af Amer: 70 mL/min — ABNORMAL LOW (ref >90)
Glucose, Bld: 161 mg/dL — ABNORMAL HIGH (ref 70–99)
Potassium: 3.3 mmol/L — ABNORMAL LOW (ref 3.5–5.1)
Sodium: 138 mmol/L (ref 135–145)
Total Bilirubin: 0.3 mg/dL (ref 0.3–1.2)
Total Protein: 5.6 g/dL — ABNORMAL LOW (ref 6.0–8.3)

## 2014-07-12 LAB — CBC WITH DIFFERENTIAL/PLATELET
Basophils Absolute: 0 10*3/uL (ref 0.0–0.1)
Basophils Relative: 0 % (ref 0–1)
Eosinophils Absolute: 0.1 10*3/uL (ref 0.0–0.7)
Eosinophils Relative: 3 % (ref 0–5)
HCT: 48.9 % (ref 39.0–52.0)
Hemoglobin: 16.2 g/dL (ref 13.0–17.0)
Lymphocytes Relative: 43 % (ref 12–46)
Lymphs Abs: 2 10*3/uL (ref 0.7–4.0)
MCH: 30.2 pg (ref 26.0–34.0)
MCHC: 33.1 g/dL (ref 30.0–36.0)
MCV: 91.2 fL (ref 78.0–100.0)
Monocytes Absolute: 0.4 10*3/uL (ref 0.1–1.0)
Monocytes Relative: 8 % (ref 3–12)
Neutro Abs: 2.1 10*3/uL (ref 1.7–7.7)
Neutrophils Relative %: 46 % (ref 43–77)
Platelets: 187 10*3/uL (ref 150–400)
RBC: 5.36 MIL/uL (ref 4.22–5.81)
RDW: 14.1 % (ref 11.5–15.5)
WBC: 4.6 10*3/uL (ref 4.0–10.5)

## 2014-07-12 LAB — LIPID PANEL
Cholesterol: 151 mg/dL (ref 0–200)
HDL: 51 mg/dL (ref >39)
LDL Cholesterol: 73 mg/dL (ref 0–99)
Total CHOL/HDL Ratio: 3 RATIO
Triglycerides: 136 mg/dL (ref <150)
VLDL: 27 mg/dL (ref 0–40)

## 2014-07-12 LAB — TROPONIN I
Troponin I: <0.03 ng/mL (ref <0.031)
Troponin I: <0.03 ng/mL (ref <0.031)
Troponin I: <0.03 ng/mL (ref <0.031)

## 2014-07-12 LAB — RAPID URINE DRUG SCREEN, HOSP PERFORMED
Amphetamines: NOT DETECTED
Barbiturates: NOT DETECTED
Benzodiazepines: NOT DETECTED
Cocaine: NOT DETECTED
Opiates: NOT DETECTED
Tetrahydrocannabinol: NOT DETECTED

## 2014-07-12 LAB — LIPASE, BLOOD: Lipase: 58 U/L (ref 11–59)

## 2014-07-12 MED ORDER — ATENOLOL 100 MG PO TABS: 100.0000 mg | ORAL_TABLET | Freq: Every day | ORAL | Status: DC

## 2014-07-12 MED ORDER — LORAZEPAM 2 MG/ML IJ SOLN: 0.0000 mg | Freq: Two times a day (BID) | INTRAMUSCULAR | Status: DC

## 2014-07-12 MED ORDER — OXYCODONE-ACETAMINOPHEN 5-325 MG PO TABS: 1.0000 | ORAL_TABLET | Freq: Three times a day (TID) | ORAL | Status: DC | PRN

## 2014-07-12 MED ORDER — NICOTINE 7 MG/24HR TD PT24
7.0000 mg | MEDICATED_PATCH | Freq: Every day | TRANSDERMAL | Status: DC
  Administered 2014-07-12: 7 mg via TRANSDERMAL
  Filled 2014-07-12: qty 1

## 2014-07-12 MED ORDER — ATENOLOL 50 MG PO TABS
50.0000 mg | ORAL_TABLET | Freq: Once | ORAL | Status: AC
  Administered 2014-07-12: 50 mg via ORAL
  Filled 2014-07-12: qty 1

## 2014-07-12 MED ORDER — LORAZEPAM 2 MG/ML IJ SOLN: 0.0000 mg | Freq: Four times a day (QID) | INTRAMUSCULAR | Status: DC

## 2014-07-12 MED ORDER — LORAZEPAM 2 MG/ML IJ SOLN: 1.0000 mg | Freq: Four times a day (QID) | INTRAMUSCULAR | Status: DC | PRN

## 2014-07-12 MED ORDER — ASPIRIN EC 81 MG PO TBEC: 81.0000 mg | DELAYED_RELEASE_TABLET | Freq: Every day | ORAL | Status: DC

## 2014-07-12 MED ORDER — POTASSIUM CHLORIDE CRYS ER 20 MEQ PO TBCR
20.0000 meq | EXTENDED_RELEASE_TABLET | Freq: Every day | ORAL | Status: DC
Start: 2014-07-13 — End: 2014-12-13

## 2014-07-12 MED ORDER — FOLIC ACID 1 MG PO TABS
1.0000 mg | ORAL_TABLET | Freq: Every day | ORAL | Status: DC
  Administered 2014-07-12: 1 mg via ORAL
  Filled 2014-07-12: qty 1

## 2014-07-12 MED ORDER — AMLODIPINE BESYLATE 10 MG PO TABS: 10.0000 mg | ORAL_TABLET | Freq: Every day | ORAL | Status: DC

## 2014-07-12 MED ORDER — LORAZEPAM 1 MG PO TABS: 1.0000 mg | ORAL_TABLET | Freq: Four times a day (QID) | ORAL | Status: DC | PRN

## 2014-07-12 MED ORDER — ADULT MULTIVITAMIN W/MINERALS CH
1.0000 | ORAL_TABLET | Freq: Every day | ORAL | Status: DC
  Administered 2014-07-12: 1 via ORAL
  Filled 2014-07-12: qty 1

## 2014-07-12 MED ORDER — VITAMIN B-1 100 MG PO TABS
100.0000 mg | ORAL_TABLET | Freq: Every day | ORAL | Status: DC
  Administered 2014-07-12: 100 mg via ORAL
  Filled 2014-07-12: qty 1

## 2014-07-12 MED ORDER — OXYCODONE HCL 5 MG PO TABS: 5.0000 mg | ORAL_TABLET | Freq: Three times a day (TID) | ORAL | Status: DC | PRN

## 2014-07-12 MED ORDER — HYDROCHLOROTHIAZIDE 25 MG PO TABS: 25.0000 mg | ORAL_TABLET | Freq: Every day | ORAL | Status: DC

## 2014-07-12 MED ORDER — POTASSIUM CHLORIDE CRYS ER 20 MEQ PO TBCR
40.0000 meq | EXTENDED_RELEASE_TABLET | Freq: Once | ORAL | Status: AC
  Administered 2014-07-12: 40 meq via ORAL
  Filled 2014-07-12 (×2): qty 2

## 2014-07-12 MED ORDER — THIAMINE HCL 100 MG/ML IJ SOLN
100.0000 mg | Freq: Every day | INTRAMUSCULAR | Status: DC
  Filled 2014-07-12: qty 1

## 2014-07-12 MED ORDER — POTASSIUM CHLORIDE CRYS ER 20 MEQ PO TBCR: 20.0000 meq | EXTENDED_RELEASE_TABLET | Freq: Every day | ORAL | Status: DC

## 2014-07-12 NOTE — Discharge Summary (Addendum)
Physician Discharge Summary  Steven Delacruz [id number] DOB: [birth year] DOA: 07/11/2014  PCP: Angelica Chessman, MD  Admit date: 07/11/2014 Discharge date: 07/12/2014  Recommendations for Outpatient Follow-up:  1. Please follow up at [facility] 07/21/14 Please obtain BMP in one week Discharge Diagnoses:  Chest pain -Mostly atypical in nature -Troponins negative 3 -EKG without any ST-T wave changes -Partly related to the patient's uncontrolled hypertension and alcohol use -Chest plain completely resolved with treatment of his hypertension -Await echocardiogram--EF 26%-94%, grade 1 diastolic dysfx, no WMA -CT angiogram chest negative for pulmonary embolus and aortic dissection -Urine drug screen negative -d/c with ASA 81 mg daily Hypertension, uncontrolled -Secondary to patient's noncompliance -Patient has not taken any of his antihypertensive medications for 3-4 weeks -Restart amlodipine, atenolol, HCTZ -Patient will go home with amlodipine 10 mg daily, atenolol 100 mg daily, HCTZ 25 mg daily and KCL 13mEq daily -Patient was given an appointment at the [facility]--07/21/2014 Alcohol dependence -CIWA -No signs of alcohol withdrawal Tobacco use -NicoDerm patch -Tobacco cessation discussed Abdominal pain -Completely resolved -Other CT abdomen and pelvis suggested a focal area of possible pancreatitis, the patient is able to tolerate his diet and has a normal lipase -Clinically not consistent with pancreatitis -Continue heart healthy diet which pt tolerated Hypokalemia -Secondary to HCTZ -Replete Descending thoracic aortic dilation -  -will need yearly follow-up.   Discharge Condition: stable  Disposition: home Follow-up Information    Follow up with Angelica Chessman, MD On 07/21/2014.   Specialty:  Internal Medicine   Why:  F/u Appt at the [facility] [place] 07/21/14 at [place]  information:   [place] [address] [phone]       Follow up with [facility].   Why:  Patient will call to have pharmacy switch to the [facility] [place] [phone]      Diet:heart healthy Wt Readings from Last 3 Encounters:  07/11/14 100.2 kg (220 lb 14.4 oz)  03/22/14 102.059 kg (225 lb)  12/21/13 100.699 kg (222 lb)    History of present illness:  64 year old male presented to the hospital with 2-3 day history of chest discomfort and shortness of breath. He was found to have systolic blood pressure greater than 200 and emergency department. The patient had serial troponins which were negative. EKG was negative for any ST-T wave changes. Echocardiogram was performed and negative for any wall motion abnormalities. EF was 65-70%. The patient's blood pressure improved with the institution of metoprolol, amlodipine, and HCTZ. His chest pain completely resolved with improvement of his blood pressure. His potassium was repleted. The patient was instructed to follow-[facility] with [facility] on 07[facility]21/2014.    Discharge Exam: Filed Vitals:   07/12/14 1213  BP: 163/93  Pulse: 69  Temp:   Resp:    Filed Vitals:   07/12/14 0348 07/12/14 0600 07/12/14 1056 07/12/14 1213  BP: 161/95 145/76 167/87 163/93  Pulse: 63  75 69  Temp: 98.2 F (36.8 C)     TempSrc: Oral     Resp: 15     Height:      Weight:      SpO2: 100%      General: A&O x 3, NAD, pleasant, cooperative Cardiovascular: RRR, no rub, no gallop, no S3 Respiratory: CTAB, no wheeze, no rhonchi Abdomen:soft, nontender, nondistended, positive bowel sounds Extremities: No edema, No lymphangitis, no petechiae  Discharge  Instructions      Discharge Instructions    Diet - low sodium heart healthy    Complete by:  As directed      Increase activity slowly    Complete by:  As directed             Medication List    TAKE these medications          acetaminophen-codeine 300-30 MG per tablet  Commonly known as:  TYLENOL #3  Take 1 tablet by mouth every 4 (four) hours as needed.     amLODipine 10 MG tablet  Commonly known as:  NORVASC  Take 1 tablet (10 mg total) by mouth daily.     atenolol 100 MG tablet  Commonly known as:  TENORMIN  Take 1 tablet (100 mg total) by mouth daily.  Start taking on:  07/13/2014     hydrochlorothiazide 25 MG tablet  Commonly known as:  HYDRODIURIL  Take 1 tablet (25 mg total) by mouth daily.     oxyCODONE-acetaminophen 10-325 MG per tablet  Commonly known as:  PERCOCET  Take 1 tablet by mouth every 8 (eight) hours as needed.     potassium chloride SA 20 MEQ tablet  Commonly known as:  K-DUR,KLOR-CON  Take 1 tablet (20 mEq total) by mouth daily.  Start taking on:  07/13/2014     tadalafil 20 MG tablet  Commonly known as:  CIALIS  Take 0.5-1 tablets (10-20 mg total) by mouth every other day as needed for erectile dysfunction.         The results of significant diagnostics from this hospitalization (including imaging, microbiology, ancillary and laboratory) are listed below for reference.    Significant Diagnostic Studies: Dg Chest 2 View  07/11/2014   CLINICAL DATA:  Acute chest pain.  EXAM: CHEST  2 VIEW  COMPARISON:  December 06, 2013.  FINDINGS: The heart size and mediastinal contours are within normal limits. No pneumothorax or pleural effusion is noted. Stable mild scarring is noted throughout both lungs. No acute pulmonary disease is noted. The visualized skeletal structures are unremarkable.  IMPRESSION: No acute cardiopulmonary abnormality seen.   Electronically Signed   By: Sabino Dick M.D.   On: 07/11/2014 13:21   Ct Angio Chest Aorta W/cm &/or Wo/cm  07/11/2014   CLINICAL DATA:  Chest pain and left-sided abdominal pain for 4 days.  EXAM: CT ANGIOGRAPHY CHEST AND ABDOMEN  TECHNIQUE: Multidetector CT imaging of the chest and abdomen was performed using the standard protocol during bolus  administration of intravenous contrast. Multiplanar CT image reconstructions and MIPs were obtained to evaluate the vascular anatomy.  CONTRAST:  119mL OMNIPAQUE IOHEXOL 350 MG/ML SOLN  COMPARISON:  Chest x-ray dated 07/11/2014 and chest CT dated 07/06/2010 and CT abdomen dated 05/13/2013  FINDINGS: CTA CHEST FINDINGS  There is focal slight dilatation of the proximal descending thoracic aorta to a diameter of 3.5 cm. There is no aortic dissection. There is no aneurysmal dilatation of the aortic root. Heart size is normal. There are no pulmonary emboli.  There is severe emphysematous disease in the upper lobes. No infiltrates or effusions. No acute osseous abnormalities.  Review of the MIP images confirms the above findings.  CTA ABDOMEN FINDINGS  There is no aortic dissection or aneurysm. There is calcification and slight atheromatous disease in the distal abdominal aorta with tortuosity and calcification of the common iliac arteries. There is slight narrowing of the origin of the celiac axis. Superior mesenteric artery is widely  patent. Moderate stenosis of the origin of the inferior mesenteric artery. Single widely patent right renal artery. Two widely patent left renal arteries.  The followup patient has a focal area of abnormal lucency in the tail of the pancreas consistent with focal pancreatitis with soft tissue stranding in the adjacent tissues extending to the adjacent fourth portion of the duodenum. No visible pseudocyst.  Liver, biliary tree, spleen, and kidneys appear normal. There is bilateral stable adrenal hyperplasia.  There are numerous diverticula in the colon without diverticulitis. Prostate gland is not enlarged. The bladder is almost empty. No adenopathy or mass lesions. No free air or free fluid. No acute osseous abnormality.  Review of the MIP images confirms the above findings.  IMPRESSION: 1. Focal pancreatitis of the tail of the pancreas with inflammation in the adjacent soft tissues and in  the fourth portion of the duodenum. 2. No evidence of aortic dissection. Focal slight dilatation of the proximal descending thoracic aorta at to a diameter of 3.4 cm. Recommend annual imaging followup by CTA or MRA. This recommendation follows 2010 ACCF/AHA/AATS/ACR/ASA/SCA/SCAI/SIR/STS/SVM Guidelines for the Diagnosis and Management of Patients with Thoracic Aortic Disease. Circulation.2010; 121: O756-E332 3. Extensive diverticulosis of the colon.   Electronically Signed   By: Rozetta Nunnery M.D.   On: 07/11/2014 20:59   Ct Angio Abd/pel W/ And/or W/o  07/11/2014   CLINICAL DATA:  Chest pain and left-sided abdominal pain for 4 days.  EXAM: CT ANGIOGRAPHY CHEST AND ABDOMEN  TECHNIQUE: Multidetector CT imaging of the chest and abdomen was performed using the standard protocol during bolus administration of intravenous contrast. Multiplanar CT image reconstructions and MIPs were obtained to evaluate the vascular anatomy.  CONTRAST:  142mL OMNIPAQUE IOHEXOL 350 MG/ML SOLN  COMPARISON:  Chest x-ray dated 07/11/2014 and chest CT dated 07/06/2010 and CT abdomen dated 05/13/2013  FINDINGS: CTA CHEST FINDINGS  There is focal slight dilatation of the proximal descending thoracic aorta to a diameter of 3.5 cm. There is no aortic dissection. There is no aneurysmal dilatation of the aortic root. Heart size is normal. There are no pulmonary emboli.  There is severe emphysematous disease in the upper lobes. No infiltrates or effusions. No acute osseous abnormalities.  Review of the MIP images confirms the above findings.  CTA ABDOMEN FINDINGS  There is no aortic dissection or aneurysm. There is calcification and slight atheromatous disease in the distal abdominal aorta with tortuosity and calcification of the common iliac arteries. There is slight narrowing of the origin of the celiac axis. Superior mesenteric artery is widely patent. Moderate stenosis of the origin of the inferior mesenteric artery. Single widely patent right  renal artery. Two widely patent left renal arteries.  The followup patient has a focal area of abnormal lucency in the tail of the pancreas consistent with focal pancreatitis with soft tissue stranding in the adjacent tissues extending to the adjacent fourth portion of the duodenum. No visible pseudocyst.  Liver, biliary tree, spleen, and kidneys appear normal. There is bilateral stable adrenal hyperplasia.  There are numerous diverticula in the colon without diverticulitis. Prostate gland is not enlarged. The bladder is almost empty. No adenopathy or mass lesions. No free air or free fluid. No acute osseous abnormality.  Review of the MIP images confirms the above findings.  IMPRESSION: 1. Focal pancreatitis of the tail of the pancreas with inflammation in the adjacent soft tissues and in the fourth portion of the duodenum. 2. No evidence of aortic dissection. Focal slight dilatation of the  proximal descending thoracic aorta at to a diameter of 3.4 cm. Recommend annual imaging followup by CTA or MRA. This recommendation follows 2010 ACCF/AHA/AATS/ACR/ASA/SCA/SCAI/SIR/STS/SVM Guidelines for the Diagnosis and Management of Patients with Thoracic Aortic Disease. Circulation.2010; 121: Z610-R604 3. Extensive diverticulosis of the colon.   Electronically Signed   By: Rozetta Nunnery M.D.   On: 07/11/2014 20:59     Microbiology: No results found for this or any previous visit (from the past 240 hour(s)).   Labs: Basic Metabolic Panel:  Recent Labs Lab 07/11/14 1255 07/12/14 0129  NA 140 138  K 3.2* 3.3*  CL 109 105  CO2 22 26  GLUCOSE 116* 161*  BUN 10 10  CREATININE 0.95 1.11  CALCIUM 9.4 9.1   Liver Function Tests:  Recent Labs Lab 07/11/14 2108 07/12/14 0129  AST 14 17  ALT 12 11  ALKPHOS 76 76  BILITOT 0.3 0.3  PROT 5.8* 5.6*  ALBUMIN 3.5 3.3*    Recent Labs Lab 07/11/14 2108 07/12/14 0129  LIPASE 49 58   No results for input(s): AMMONIA in the last 168 hours. CBC:  Recent  Labs Lab 07/11/14 1255 07/12/14 0129  WBC 5.6 4.6  NEUTROABS 2.6 2.1  HGB 18.4* 16.2  HCT 53.6* 48.9  MCV 92.4 91.2  PLT 218 187   Cardiac Enzymes:  Recent Labs Lab 07/11/14 1925 07/12/14 0129 07/12/14 0727 07/12/14 1345  TROPONINI <0.03 <0.03 <0.03 <0.03   BNP: Invalid input(s): POCBNP CBG: No results for input(s): GLUCAP in the last 168 hours.  Time coordinating discharge:  Greater than 30 minutes  Signed:  Marcelino Campos, DO [facility] Pager: [phone] 07/12/2014, 2:59 PM

## 2014-07-12 NOTE — Progress Notes (Signed)
Pt. Received discharge instructions and prescriptions. Educated pt. On follow-up appointments. Removed IV. No skin issues noted. All questions answered. No further needs noted at this time.

## 2014-07-12 NOTE — Plan of Care (Signed)
Problem: Phase I Progression Outcomes Goal: OOB as tolerated unless otherwise ordered Outcome: Completed/Met Date Met:  07/12/14 Up with cane

## 2014-07-12 NOTE — Progress Notes (Signed)
UR completed 

## 2014-07-12 NOTE — Progress Notes (Signed)
Echocardiogram 2D Echocardiogram has been performed.  Steven Delacruz 07/12/2014, 9:05 AM

## 2014-07-15 ENCOUNTER — Telehealth: Payer: Self-pay

## 2014-07-15 NOTE — Telephone Encounter (Signed)
Post-discharge Follow-Up Phone Call: Date of Discharge: 07/12/14 Principal Discharge Diagnosis:  Chest pain, uncontrolled hypertension    Please check all that apply: X  Patient is knowledgeable of his/her condition(s) and/or treatment. ? Family and/or caregiver is knowledgeable of patient's condition(s) and/or treatment. X Patient is caring for self at home. ? Patient is receiving home health services. If so, name of agency.    Medication Reconciliation:  X Medication list reviewed with patient. X  Patient able to obtain needed medications. Patient obtained all discharge medications.  Teaching done on importance of taking all medications as prescribed. In addition, teaching done on action of each medication.   Community resources in place for patient:  X  None  ? Home Health  ? Assisted Living ? Hospice ? Support Group          Patient Education:  Medication education done.  Discussed importance of taking blood pressure regularly-daily preferred if possible. Patient indicates he has been taking his blood pressure daily since discharge.  Patient reminded of schedule follow-up appointment with Dr. Jegede-07/21/14 at 1145. Patient verbalized understanding and appreciative of call.

## 2014-07-21 ENCOUNTER — Ambulatory Visit: Payer: Medicaid Other | Admitting: Internal Medicine

## 2014-08-08 ENCOUNTER — Ambulatory Visit: Payer: Medicare Other | Attending: Internal Medicine | Admitting: Internal Medicine

## 2014-08-08 ENCOUNTER — Encounter: Payer: Self-pay | Admitting: Internal Medicine

## 2014-08-08 VITALS — BP 131/81 | HR 56 | Temp 98.5°F | Ht 72.0 in | Wt 215.8 lb

## 2014-08-08 DIAGNOSIS — F17210 Nicotine dependence, cigarettes, uncomplicated: Secondary | ICD-10-CM | POA: Insufficient documentation

## 2014-08-08 DIAGNOSIS — M545 Low back pain, unspecified: Secondary | ICD-10-CM

## 2014-08-08 DIAGNOSIS — M25511 Pain in right shoulder: Secondary | ICD-10-CM | POA: Insufficient documentation

## 2014-08-08 DIAGNOSIS — I10 Essential (primary) hypertension: Secondary | ICD-10-CM | POA: Insufficient documentation

## 2014-08-08 MED ORDER — AMLODIPINE BESYLATE 10 MG PO TABS: 10.0000 mg | ORAL_TABLET | Freq: Every day | ORAL | Status: DC

## 2014-08-08 MED ORDER — ACETAMINOPHEN-CODEINE #3 300-30 MG PO TABS: 1.0000 | ORAL_TABLET | Freq: Three times a day (TID) | ORAL | Status: DC | PRN

## 2014-08-08 MED ORDER — HYDROCHLOROTHIAZIDE 25 MG PO TABS: 25.0000 mg | ORAL_TABLET | Freq: Every day | ORAL | Status: DC

## 2014-08-08 MED ORDER — ATENOLOL 100 MG PO TABS: 100.0000 mg | ORAL_TABLET | Freq: Every day | ORAL | Status: DC

## 2014-08-08 NOTE — Progress Notes (Signed)
Patient ID: Steven Delacruz, male   DOB: 01-04-1951, 64 y.o.   MRN: [id number]   Steven Delacruz, is a 64 y.o. male  [id number]  [id number]  DOB - 04/27/1951  Chief Complaint  Patient presents with  . Follow-up        Subjective:   Steven Delacruz is a 64 y.o. male here today for a follow up visit. Patient was recently seen in the ED and admitted to the hospital for chest pain. He ruled out for myocardial infarction, CT angiogram shows no dissection. There was an incidental finding of pancreatitis but normal lipase. Patient is here today for hospital discharge follow-up. He has no new complaints. Chest pain is minimal but is still on and off. Patient continues to smoke heavily despite repeated counseling. Patient has not had colonoscopy done. His medical history is significant for hypertension, chronic back pain, tobacco abuse, and alcoholism. Patient claimed he is now sober, does not drink alcohol anymore but still smokes cigarette. His blood pressure was elevated in the ED, patient claimed he was out of his medications 2 weeks before he went to the ED. Patient has No headache, No abdominal pain - No Nausea, No new weakness tingling or numbness, No Cough - SOB.  No problems updated.  ALLERGIES: Allergies  Allergen Reactions  . Lisinopril Swelling    PAST MEDICAL HISTORY: Past Medical History  Diagnosis Date  . Hypertension   . Chronic back pain   . Right shoulder pain   . Myocardial infarction   . Headache(784.0)   . Abnormal CT scan, pancreas - ? focal pancreatitis  05/13/2013  . Abdominal aortic atherosclerosis 05/13/2013    MEDICATIONS AT HOME: Prior to Admission medications   Medication Sig Start Date End Date Taking? Authorizing Provider  amLODipine (NORVASC) 10 MG tablet Take 1 tablet (10 mg total) by mouth daily. 08/08/14  Yes Tresa Garter, MD  atenolol (TENORMIN) 100 MG tablet Take 1 tablet (100 mg total) by mouth daily. 08/08/14  Yes Tresa Garter,  MD  hydrochlorothiazide (HYDRODIURIL) 25 MG tablet Take 1 tablet (25 mg total) by mouth daily. 08/08/14  Yes Tresa Garter, MD  oxyCODONE-acetaminophen (PERCOCET) 10-325 MG per tablet Take 1 tablet by mouth every 8 (eight) hours as needed. 06/28/14  Yes Historical Provider, MD  potassium chloride SA (K-DUR,KLOR-CON) 20 MEQ tablet Take 1 tablet (20 mEq total) by mouth daily. 07/13/14  Yes Orson Eva, MD  acetaminophen-codeine (TYLENOL #3) 300-30 MG per tablet Take 1 tablet by mouth every 8 (eight) hours as needed. 08/08/14   Tresa Garter, MD  aspirin EC 81 MG tablet Take 1 tablet (81 mg total) by mouth daily. Patient not taking: Reported on 08/08/2014 07/12/14   Orson Eva, MD  tadalafil (CIALIS) 20 MG tablet Take 0.5-1 tablets (10-20 mg total) by mouth every other day as needed for erectile dysfunction. Patient not taking: Reported on 08/08/2014 12/21/13   Tresa Garter, MD     Objective:   Filed Vitals:   08/08/14 1509  BP: 131/81  Pulse: 56  Temp: 98.5 F (36.9 C)  TempSrc: Oral  Height: 6' (1.829 m)  Weight: 215 lb 12.8 oz (97.886 kg)  SpO2: 99%    Exam General appearance : Awake, alert, not in any distress. Speech Clear. Not toxic looking HEENT: Atraumatic and Normocephalic, pupils equally reactive to light and accomodation Neck: supple, no JVD. No cervical lymphadenopathy.  Chest:Good air entry bilaterally, no added sounds  CVS: S1 S2 regular, no murmurs.  Abdomen: Bowel sounds present, Non tender and mildly distended with no gaurding, rigidity or rebound. Extremities: B/L Lower Ext shows no edema, both legs are warm to touch Neurology: Awake alert, and oriented X 3, CN II-XII intact, Non focal Skin:No Rash Wounds:N/A  Data Review Lab Results  Component Value Date   HGBA1C 5.4 12/21/2013   HGBA1C 6.3* 03/03/2013   HGBA1C * 07/07/2010    5.7 (NOTE)                                                                       According to the ADA Clinical Practice  Recommendations for 2011, when HbA1c is used as a screening test:   >=6.5%   Diagnostic of Diabetes Mellitus           (if abnormal result  is confirmed)  5.7-6.4%   Increased risk of developing Diabetes Mellitus  References:Diagnosis and Classification of Diabetes Mellitus,Diabetes PZWC,5852,77(OEUMP 1):S62-S69 and Standards of Medical Care in         Diabetes - 2011,Diabetes Care,2011,34  (Suppl 1):S11-S61.     Assessment & Plan   1. Essential hypertension  - Basic Metabolic Panel - hydrochlorothiazide (HYDRODIURIL) 25 MG tablet; Take 1 tablet (25 mg total) by mouth daily.  Dispense: 90 tablet; Refill: 3 - atenolol (TENORMIN) 100 MG tablet; Take 1 tablet (100 mg total) by mouth daily.  Dispense: 90 tablet; Refill: 3 - amLODipine (NORVASC) 10 MG tablet; Take 1 tablet (10 mg total) by mouth daily.  Dispense: 90 tablet; Refill: 3 - Dash diet  2. Pain in joint, shoulder region, right  - acetaminophen-codeine (TYLENOL #3) 300-30 MG per tablet; Take 1 tablet by mouth every 8 (eight) hours as needed.  Dispense: 90 tablet; Refill: 0  3. Back pain at L4-L5 level  - acetaminophen-codeine (TYLENOL #3) 300-30 MG per tablet; Take 1 tablet by mouth every 8 (eight) hours as needed.  Dispense: 90 tablet; Refill: 0   Patient was counseled extensively about nutrition and exercise Patient was extensively counseled on smoking cessation.   Return in about 3 months (around 11/06/2014), or if symptoms worsen or fail to improve, for Follow up HTN, Follow up Pain and comorbidities.  The patient was given clear instructions to go to ER or return to medical center if symptoms don't improve, worsen or new problems develop. The patient verbalized understanding. The patient was told to call to get lab results if they haven't heard anything in the next week.   This note has been created with Surveyor, quantity. Any transcriptional errors are unintentional.    Angelica Chessman, MD, [place], [place], [place] and [place], [place]   08/08/2014, 3:31 PM

## 2014-08-08 NOTE — Progress Notes (Signed)
Patient is here for follow-up after ER visit on 07-11-14 for chest pain. Patient states he was advised at that time that his BP was elevated.  Pt states he has some chest pain on and off since ER visit. Pt denies any chest pain at this time.  Pt needs refills on amlodipine, atenolol, and HCTZ.

## 2014-08-08 NOTE — Patient Instructions (Signed)
Smoking Cessation Quitting smoking is important to your health and has many advantages. However, it is not always easy to quit since nicotine is a very addictive drug. Oftentimes, people try 3 times or more before being able to quit. This document explains the best ways for you to prepare to quit smoking. Quitting takes hard work and a lot of effort, but you can do it. ADVANTAGES OF QUITTING SMOKING  You will live longer, feel better, and live better.  Your body will feel the impact of quitting smoking almost immediately.  Within 20 minutes, blood pressure decreases. Your pulse returns to its normal level.  After 8 hours, carbon monoxide levels in the blood return to normal. Your oxygen level increases.  After 24 hours, the chance of having a heart attack starts to decrease. Your breath, hair, and body stop smelling like smoke.  After 48 hours, damaged nerve endings begin to recover. Your sense of taste and smell improve.  After 72 hours, the body is virtually free of nicotine. Your bronchial tubes relax and breathing becomes easier.  After 2 to 12 weeks, lungs can hold more air. Exercise becomes easier and circulation improves.  The risk of having a heart attack, stroke, cancer, or lung disease is greatly reduced.  After 1 year, the risk of coronary heart disease is cut in half.  After 5 years, the risk of stroke falls to the same as a nonsmoker.  After 10 years, the risk of lung cancer is cut in half and the risk of other cancers decreases significantly.  After 15 years, the risk of coronary heart disease drops, usually to the level of a nonsmoker.  If you are pregnant, quitting smoking will improve your chances of having a healthy baby.  The people you live with, especially any children, will be healthier.  You will have extra money to spend on things other than cigarettes. QUESTIONS TO THINK ABOUT BEFORE ATTEMPTING TO QUIT You may want to talk about your answers with your  health care provider.  Why do you want to quit?  If you tried to quit in the past, what helped and what did not?  What will be the most difficult situations for you after you quit? How will you plan to handle them?  Who can help you through the tough times? Your family? Friends? A health care provider?  What pleasures do you get from smoking? What ways can you still get pleasure if you quit? Here are some questions to ask your health care provider:  How can you help me to be successful at quitting?  What medicine do you think would be best for me and how should I take it?  What should I do if I need more help?  What is smoking withdrawal like? How can I get information on withdrawal? GET READY  Set a quit date.  Change your environment by getting rid of all cigarettes, ashtrays, matches, and lighters in your home, car, or work. Do not let people smoke in your home.  Review your past attempts to quit. Think about what worked and what did not. GET SUPPORT AND ENCOURAGEMENT You have a better chance of being successful if you have help. You can get support in many ways.  Tell your family, friends, and coworkers that you are going to quit and need their support. Ask them not to smoke around you.  Get individual, group, or telephone counseling and support. Programs are available at local hospitals and health centers. Call   your local health department for information about programs in your area. °· Spiritual beliefs and practices may help some smokers quit. °· Download a "quit meter" on your computer to keep track of quit statistics, such as how long you have gone without smoking, cigarettes not smoked, and money saved. °· Get a self-help book about quitting smoking and staying off tobacco. °LEARN NEW SKILLS AND BEHAVIORS °· Distract yourself from urges to smoke. Talk to someone, go for a walk, or occupy your time with a task. °· Change your normal routine. Take a different route to work.  Drink tea instead of coffee. Eat breakfast in a different place. °· Reduce your stress. Take a hot bath, exercise, or read a book. °· Plan something enjoyable to do every day. Reward yourself for not smoking. °· Explore interactive web-based programs that specialize in helping you quit. °GET MEDICINE AND USE IT CORRECTLY °Medicines can help you stop smoking and decrease the urge to smoke. Combining medicine with the above behavioral methods and support can greatly increase your chances of successfully quitting smoking. °· Nicotine replacement therapy helps deliver nicotine to your body without the negative effects and risks of smoking. Nicotine replacement therapy includes nicotine gum, lozenges, inhalers, nasal sprays, and skin patches. Some may be available over-the-counter and others require a prescription. °· Antidepressant medicine helps people abstain from smoking, but how this works is unknown. This medicine is available by prescription. °· Nicotinic receptor partial agonist medicine simulates the effect of nicotine in your brain. This medicine is available by prescription. °Ask your health care provider for advice about which medicines to use and how to use them based on your health history. Your health care provider will tell you what side effects to look out for if you choose to be on a medicine or therapy. Carefully read the information on the package. Do not use any other product containing nicotine while using a nicotine replacement product.  °RELAPSE OR DIFFICULT SITUATIONS °Most relapses occur within the first 3 months after quitting. Do not be discouraged if you start smoking again. Remember, most people try several times before finally quitting. You may have symptoms of withdrawal because your body is used to nicotine. You may crave cigarettes, be irritable, feel very hungry, cough often, get headaches, or have difficulty concentrating. The withdrawal symptoms are only temporary. They are strongest  when you first quit, but they will go away within 10-14 days. °To reduce the chances of relapse, try to: °· Avoid drinking alcohol. Drinking lowers your chances of successfully quitting. °· Reduce the amount of caffeine you consume. Once you quit smoking, the amount of caffeine in your body increases and can give you symptoms, such as a rapid heartbeat, sweating, and anxiety. °· Avoid smokers because they can make you want to smoke. °· Do not let weight gain distract you. Many smokers will gain weight when they quit, usually less than 10 pounds. Eat a healthy diet and stay active. You can always lose the weight gained after you quit. °· Find ways to improve your mood other than smoking. °FOR MORE INFORMATION  °www.smokefree.gov  °Document Released: 06/11/2001 Document Revised: 11/01/2013 Document Reviewed: 09/26/2011 °ExitCare® Patient Information ©2015 [employer]. This information is not intended to replace advice given to you by your health care provider. Make sure you discuss any questions you have with your health care provider. °Hypertension °Hypertension, commonly called high blood pressure, is when the force of blood pumping through your arteries is too strong. Your arteries   are the blood vessels that carry blood from your heart throughout your body. A blood pressure reading consists of a higher number over a lower number, such as 110/72. The higher number (systolic) is the pressure inside your arteries when your heart pumps. The lower number (diastolic) is the pressure inside your arteries when your heart relaxes. Ideally you want your blood pressure below 120/80. °Hypertension forces your heart to work harder to pump blood. Your arteries may become narrow or stiff. Having hypertension puts you at risk for heart disease, stroke, and other problems.  °RISK FACTORS °Some risk factors for high blood pressure are controllable. Others are not.  °Risk factors you cannot control include:  °· Race. You may be at  higher risk if you are African American. °· Age. Risk increases with age. °· Gender. Men are at higher risk than women before age 45 years. After age 65, women are at higher risk than men. °Risk factors you can control include: °· Not getting enough exercise or physical activity. °· Being overweight. °· Getting too much fat, sugar, calories, or salt in your diet. °· Drinking too much alcohol. °SIGNS AND SYMPTOMS °Hypertension does not usually cause signs or symptoms. Extremely high blood pressure (hypertensive crisis) may cause headache, anxiety, shortness of breath, and nosebleed. °DIAGNOSIS  °To check if you have hypertension, your health care provider will measure your blood pressure while you are seated, with your arm held at the level of your heart. It should be measured at least twice using the same arm. Certain conditions can cause a difference in blood pressure between your right and left arms. A blood pressure reading that is higher than normal on one occasion does not mean that you need treatment. If one blood pressure reading is high, ask your health care provider about having it checked again. °TREATMENT  °Treating high blood pressure includes making lifestyle changes and possibly taking medicine. Living a healthy lifestyle can help lower high blood pressure. You may need to change some of your habits. °Lifestyle changes may include: °· Following the DASH diet. This diet is high in fruits, vegetables, and whole grains. It is low in salt, red meat, and added sugars. °· Getting at least 2½ hours of brisk physical activity every week. °· Losing weight if necessary. °· Not smoking. °· Limiting alcoholic beverages. °· Learning ways to reduce stress. ° If lifestyle changes are not enough to get your blood pressure under control, your health care provider may prescribe medicine. You may need to take more than one. Work closely with your health care provider to understand the risks and benefits. °HOME CARE  INSTRUCTIONS °· Have your blood pressure rechecked as directed by your health care provider.   °· Take medicines only as directed by your health care provider. Follow the directions carefully. Blood pressure medicines must be taken as prescribed. The medicine does not work as well when you skip doses. Skipping doses also puts you at risk for problems.   °· Do not smoke.   °· Monitor your blood pressure at home as directed by your health care provider.  °SEEK MEDICAL CARE IF:  °· You think you are having a reaction to medicines taken. °· You have recurrent headaches or feel dizzy. °· You have swelling in your ankles. °· You have trouble with your vision. °SEEK IMMEDIATE MEDICAL CARE IF: °· You develop a severe headache or confusion. °· You have unusual weakness, numbness, or feel faint. °· You have severe chest or abdominal pain. °· You vomit repeatedly. °·   You have trouble breathing. MAKE SURE YOU:   Understand these instructions.  Will watch your condition.  Will get help right away if you are not doing well or get worse. Document Released: 06/17/2005 Document Revised: 11/01/2013 Document Reviewed: 04/09/2013 [facility] Patient Information 2015 [place], [place]. This information is not intended to replace advice given to you by your health care provider. Make sure you discuss any questions you have with your health care provider. DASH Eating Plan DASH stands for "Dietary Approaches to Stop Hypertension." The DASH eating plan is a healthy eating plan that has been shown to reduce high blood pressure (hypertension). Additional health benefits may include reducing the risk of type 2 diabetes mellitus, heart disease, and stroke. The DASH eating plan may also help with weight loss. WHAT DO I NEED TO KNOW ABOUT THE DASH EATING PLAN? For the DASH eating plan, you will follow these general guidelines:  Choose foods with a percent daily value for sodium of less than 5% (as listed on the food label).  Use  salt-free seasonings or herbs instead of table salt or sea salt.  Check with your health care provider or pharmacist before using salt substitutes.  Eat lower-sodium products, often labeled as "lower sodium" or "no salt added."  Eat fresh foods.  Eat more vegetables, fruits, and low-fat dairy products.  Choose whole grains. Look for the word "whole" as the first word in the ingredient list.  Choose fish and skinless chicken or [place] more often than red meat. Limit fish, poultry, and meat to 6 oz (170 g) each day.  Limit sweets, desserts, sugars, and sugary drinks.  Choose heart-healthy fats.  Limit cheese to 1 oz (28 g) per day.  Eat more home-cooked food and less restaurant, buffet, and fast food.  Limit fried foods.  Cook foods using methods other than frying.  Limit canned vegetables. If you do use them, rinse them well to decrease the sodium.  When eating at a restaurant, ask that your food be prepared with less salt, or no salt if possible. WHAT FOODS CAN I EAT? Seek help from a dietitian for individual calorie needs. Grains Whole grain or whole wheat bread. Brown rice. Whole grain or whole wheat pasta. Quinoa, bulgur, and whole grain cereals. Low-sodium cereals. Corn or whole wheat flour tortillas. Whole grain cornbread. Whole grain crackers. Low-sodium crackers. Vegetables Fresh or frozen vegetables (raw, steamed, roasted, or grilled). Low-sodium or reduced-sodium tomato and vegetable juices. Low-sodium or reduced-sodium tomato sauce and paste. Low-sodium or reduced-sodium canned vegetables.  Fruits All fresh, canned (in natural juice), or frozen fruits. Meat and Other Protein Products Ground beef (85% or leaner), grass-fed beef, or beef trimmed of fat. Skinless chicken or [place]. Ground chicken or [place]. Pork trimmed of fat. All fish and seafood. Eggs. Dried beans, peas, or lentils. Unsalted nuts and seeds. Unsalted canned beans. Dairy Low-fat dairy products, such as  skim or 1% milk, 2% or reduced-fat cheeses, low-fat ricotta or cottage cheese, or plain low-fat yogurt. Low-sodium or reduced-sodium cheeses. Fats and Oils Tub margarines without trans fats. Light or reduced-fat mayonnaise and salad dressings (reduced sodium). Avocado. Safflower, olive, or canola oils. Natural peanut or almond butter. Other Unsalted popcorn and pretzels. The items listed above may not be a complete list of recommended foods or beverages. Contact your dietitian for more options. WHAT FOODS ARE NOT RECOMMENDED? Grains White bread. White pasta. White rice. Refined cornbread. Bagels and croissants. Crackers that contain trans fat. Vegetables Creamed or fried vegetables. Vegetables in  a cheese sauce. Regular canned vegetables. Regular canned tomato sauce and paste. Regular tomato and vegetable juices. Fruits Dried fruits. Canned fruit in light or heavy syrup. Fruit juice. Meat and Other Protein Products Fatty cuts of meat. Ribs, chicken wings, bacon, sausage, bologna, salami, chitterlings, fatback, hot dogs, bratwurst, and packaged luncheon meats. Salted nuts and seeds. Canned beans with salt. Dairy Whole or 2% milk, cream, half-and-half, and cream cheese. Whole-fat or sweetened yogurt. Full-fat cheeses or blue cheese. Nondairy creamers and whipped toppings. Processed cheese, cheese spreads, or cheese curds. Condiments Onion and garlic salt, seasoned salt, table salt, and sea salt. Canned and packaged gravies. Worcestershire sauce. Tartar sauce. Barbecue sauce. Teriyaki sauce. Soy sauce, including reduced sodium. Steak sauce. Fish sauce. Oyster sauce. Cocktail sauce. Horseradish. Ketchup and mustard. Meat flavorings and tenderizers. Bouillon cubes. Hot sauce. Tabasco sauce. Marinades. Taco seasonings. Relishes. Fats and Oils Butter, stick margarine, lard, shortening, ghee, and bacon fat. Coconut, palm kernel, or palm oils. Regular salad dressings. Other Pickles and olives. Salted  popcorn and pretzels. The items listed above may not be a complete list of foods and beverages to avoid. Contact your dietitian for more information. WHERE CAN I FIND MORE INFORMATION? National Heart, Lung, and Blood Institute: travelstabloid.com Document Released: 06/06/2011 Document Revised: 11/01/2013 Document Reviewed: 04/21/2013 [facility] Patient Information 2015 [place], [place]. This information is not intended to replace advice given to you by your health care provider. Make sure you discuss any questions you have with your health care provider.

## 2014-08-09 LAB — BASIC METABOLIC PANEL
BUN: 15 mg/dL (ref 6–23)
CO2: 28 mEq/L (ref 19–32)
Calcium: 9.3 mg/dL (ref 8.4–10.5)
Chloride: 104 mEq/L (ref 96–112)
Creat: 0.99 mg/dL (ref 0.50–1.35)
Glucose, Bld: 103 mg/dL — ABNORMAL HIGH (ref 70–99)
Potassium: 3.9 mEq/L (ref 3.5–5.3)
Sodium: 142 mEq/L (ref 135–145)

## 2014-08-11 ENCOUNTER — Telehealth: Payer: Self-pay | Admitting: Emergency Medicine

## 2014-08-11 NOTE — Telephone Encounter (Signed)
Pt given normal blood work results 

## 2014-08-11 NOTE — Telephone Encounter (Signed)
-----   Message from Tresa Garter, MD sent at 08/10/2014  5:12 PM EST ----- Please inform patient that his basic metabolic panel is normal. Potassium is within normal limits.

## 2014-08-19 ENCOUNTER — Telehealth: Payer: Self-pay | Admitting: Emergency Medicine

## 2014-08-19 NOTE — Telephone Encounter (Signed)
Patient can come pick up his paperwork.

## 2014-11-01 DIAGNOSIS — M47817 Spondylosis without myelopathy or radiculopathy, lumbosacral region: Secondary | ICD-10-CM | POA: Diagnosis not present

## 2014-11-01 DIAGNOSIS — M545 Low back pain: Secondary | ICD-10-CM | POA: Diagnosis not present

## 2014-11-02 DIAGNOSIS — Z5181 Encounter for therapeutic drug level monitoring: Secondary | ICD-10-CM | POA: Diagnosis not present

## 2014-11-02 DIAGNOSIS — F1920 Other psychoactive substance dependence, uncomplicated: Secondary | ICD-10-CM | POA: Diagnosis not present

## 2014-12-01 ENCOUNTER — Ambulatory Visit: Payer: Medicare Other | Attending: Internal Medicine | Admitting: Internal Medicine

## 2014-12-01 ENCOUNTER — Encounter: Payer: Self-pay | Admitting: Internal Medicine

## 2014-12-01 VITALS — BP 129/80 | HR 83 | Temp 98.1°F | Ht 72.0 in | Wt 215.0 lb

## 2014-12-01 DIAGNOSIS — M545 Low back pain, unspecified: Secondary | ICD-10-CM

## 2014-12-01 DIAGNOSIS — M25511 Pain in right shoulder: Secondary | ICD-10-CM | POA: Diagnosis not present

## 2014-12-01 MED ORDER — ACETAMINOPHEN-CODEINE #3 300-30 MG PO TABS: 1.0000 | ORAL_TABLET | Freq: Three times a day (TID) | ORAL | Status: DC | PRN

## 2014-12-01 NOTE — Progress Notes (Signed)
Patient here for follow up on HTN and left shoulder pain Pain started years ago he states the pain is from arthritis He needs refill on Tylenol 3

## 2014-12-01 NOTE — Progress Notes (Signed)
Patient ID: Steven Delacruz, male   DOB: [birth year], 64 y.o.   MRN: [id number]   Steven Delacruz, is a 64 y.o. male  [id number]:[id number]  [id number]  DOB - 1950/07/04  Chief Complaint  Patient presents with  . Hypertension  . Shoulder Pain        Subjective:   Steven Delacruz is a 64 y.o. male here today for a follow up visit.patient has history of hypertension, chronic back pain and chronic shoulder pain, dyslipidemia and tobacco abuse here today for routine follow-up. He is requesting refill of his pain medication. He has no new complaint except for his ongoing erectile dysfunction, he is not able to afford Viagra or Cialis.he continues to smoke heavily and not ready to quit. He does not need any refill on his blood pressure medicine today. His last colonoscopy was 2008, benign polyps an internal hemorrhoid,otherwise normal. Patient has No headache, No chest pain, No abdominal pain - No Nausea, No new weakness tingling or numbness, No Cough - SOB.  No problems updated.  ALLERGIES: Allergies  Allergen Reactions  . Lisinopril Swelling    PAST MEDICAL HISTORY: Past Medical History  Diagnosis Date  . Hypertension   . Chronic back pain   . Right shoulder pain   . Myocardial infarction   . Headache(784.0)   . Abnormal CT scan, pancreas - ? focal pancreatitis  05/13/2013  . Abdominal aortic atherosclerosis 05/13/2013    MEDICATIONS AT HOME: Prior to Admission medications   Medication Sig Start Date End Date Taking? Authorizing Provider  acetaminophen-codeine (TYLENOL #3) 300-30 MG per tablet Take 1 tablet by mouth every 8 (eight) hours as needed. 12/01/14  Yes Tresa Garter, MD  amLODipine (NORVASC) 10 MG tablet Take 1 tablet (10 mg total) by mouth daily. 08/08/14  Yes Tresa Garter, MD  atenolol (TENORMIN) 100 MG tablet Take 1 tablet (100 mg total) by mouth daily. 08/08/14  Yes Tresa Garter, MD  hydrochlorothiazide (HYDRODIURIL) 25 MG tablet Take 1 tablet (25 mg  total) by mouth daily. 08/08/14  Yes Tresa Garter, MD  oxyCODONE-acetaminophen (PERCOCET) 10-325 MG per tablet Take 1 tablet by mouth every 8 (eight) hours as needed. 06/28/14  Yes Historical Provider, MD  aspirin EC 81 MG tablet Take 1 tablet (81 mg total) by mouth daily. Patient not taking: Reported on 08/08/2014 07/12/14   Orson Eva, MD  potassium chloride SA (K-DUR,KLOR-CON) 20 MEQ tablet Take 1 tablet (20 mEq total) by mouth daily. Patient not taking: Reported on 12/01/2014 07/13/14   Orson Eva, MD  tadalafil (CIALIS) 20 MG tablet Take 0.5-1 tablets (10-20 mg total) by mouth every other day as needed for erectile dysfunction. Patient not taking: Reported on 08/08/2014 12/21/13   Tresa Garter, MD     Objective:   Filed Vitals:   12/01/14 1459  BP: 129/80  Pulse: 83  Temp: 98.1 F (36.7 C)  Height: 6' (1.829 m)  Weight: 215 lb (97.523 kg)  SpO2: 97%    Exam General appearance : Awake, alert, not in any distress. Speech Clear. Not toxic looking HEENT: Atraumatic and Normocephalic, pupils equally reactive to light and accomodation Neck: supple, no JVD. No cervical lymphadenopathy.  Chest:Good air entry bilaterally, no added sounds  CVS: S1 S2 regular, no murmurs.  Abdomen: Bowel sounds present, Non tender and not distended with no gaurding, rigidity or rebound. Extremities: B/L Lower Ext shows no edema, both legs are warm to touch Neurology: Awake alert, and oriented X 3, CN II-XII  intact, Non focal Skin:No Rash  Data Review Lab Results  Component Value Date   HGBA1C 5.4 12/21/2013   HGBA1C 6.3* 03/03/2013   HGBA1C * 07/07/2010    5.7 (NOTE)                                                                       According to the ADA Clinical Practice Recommendations for 2011, when HbA1c is used as a screening test:   >=6.5%   Diagnostic of Diabetes Mellitus           (if abnormal result  is confirmed)  5.7-6.4%   Increased risk of developing Diabetes Mellitus   References:Diagnosis and Classification of Diabetes Mellitus,Diabetes DDUK,0254,27(CWCBJ 1):S62-S69 and Standards of Medical Care in         Diabetes - 2011,Diabetes Care,2011,34  (Suppl 1):S11-S61.     Assessment & Plan   1. Pain in joint, shoulder region, right  - acetaminophen-codeine (TYLENOL #3) 300-30 MG per tablet; Take 1 tablet by mouth every 8 (eight) hours as needed.  Dispense: 90 tablet; Refill: 0  2. Back pain at L4-L5 level  - acetaminophen-codeine (TYLENOL #3) 300-30 MG per tablet; Take 1 tablet by mouth every 8 (eight) hours as needed.  Dispense: 90 tablet; Refill: 0  The patient was counseled on the dangers of tobacco use, and was advised to quit. Reviewed strategies to maximize success, including removing cigarettes and smoking materials from environment, stress management and support of family/friends.   Patient have been counseled extensively about nutrition and exercise Return in about 2 weeks (around 12/15/2014) for Annual Physical.  The patient was given clear instructions to go to ER or return to medical center if symptoms don't improve, worsen or new problems develop. The patient verbalized understanding. The patient was told to call to get lab results if they haven't heard anything in the next week.   This note has been created with Surveyor, quantity. Any transcriptional errors are unintentional.    Angelica Chessman, MD, [place], [place], [place], [facility] and [place], [place]   12/01/2014, 3:24 PM

## 2014-12-12 ENCOUNTER — Encounter: Payer: Self-pay | Admitting: Internal Medicine

## 2014-12-12 ENCOUNTER — Ambulatory Visit: Payer: Medicare Other | Attending: Internal Medicine | Admitting: Internal Medicine

## 2014-12-12 VITALS — BP 140/82 | HR 52 | Temp 97.7°F | Resp 16 | Ht 72.05 in | Wt 215.0 lb

## 2014-12-12 DIAGNOSIS — Z0000 Encounter for general adult medical examination without abnormal findings: Secondary | ICD-10-CM | POA: Insufficient documentation

## 2014-12-12 DIAGNOSIS — I10 Essential (primary) hypertension: Secondary | ICD-10-CM | POA: Diagnosis not present

## 2014-12-12 DIAGNOSIS — E781 Pure hyperglyceridemia: Secondary | ICD-10-CM | POA: Diagnosis present

## 2014-12-12 DIAGNOSIS — Z1211 Encounter for screening for malignant neoplasm of colon: Secondary | ICD-10-CM | POA: Insufficient documentation

## 2014-12-12 DIAGNOSIS — Z720 Tobacco use: Secondary | ICD-10-CM | POA: Diagnosis not present

## 2014-12-12 LAB — COMPLETE METABOLIC PANEL WITH GFR
ALT: 9 U/L (ref 0–53)
AST: 11 U/L (ref 0–37)
Albumin: 3.8 g/dL (ref 3.5–5.2)
Alkaline Phosphatase: 79 U/L (ref 39–117)
BUN: 9 mg/dL (ref 6–23)
CO2: 24 mEq/L (ref 19–32)
Calcium: 9.3 mg/dL (ref 8.4–10.5)
Chloride: 104 mEq/L (ref 96–112)
Creat: 0.98 mg/dL (ref 0.50–1.35)
GFR, Est African American: >89 mL/min
GFR, Est Non African American: 82 mL/min
Glucose, Bld: 104 mg/dL — ABNORMAL HIGH (ref 70–99)
Potassium: 3.2 mEq/L — ABNORMAL LOW (ref 3.5–5.3)
Sodium: 142 mEq/L (ref 135–145)
Total Bilirubin: 0.5 mg/dL (ref 0.2–1.2)
Total Protein: 6.2 g/dL (ref 6.0–8.3)

## 2014-12-12 LAB — CBC WITH DIFFERENTIAL/PLATELET
Basophils Absolute: 0 10*3/uL (ref 0.0–0.1)
Basophils Relative: 0 % (ref 0–1)
Eosinophils Absolute: 0.1 10*3/uL (ref 0.0–0.7)
Eosinophils Relative: 2 % (ref 0–5)
HCT: 49.8 % (ref 39.0–52.0)
Hemoglobin: 16.7 g/dL (ref 13.0–17.0)
Lymphocytes Relative: 41 % (ref 12–46)
Lymphs Abs: 2.3 10*3/uL (ref 0.7–4.0)
MCH: 31.2 pg (ref 26.0–34.0)
MCHC: 33.5 g/dL (ref 30.0–36.0)
MCV: 93.1 fL (ref 78.0–100.0)
MPV: 10.7 fL (ref 8.6–12.4)
Monocytes Absolute: 0.4 10*3/uL (ref 0.1–1.0)
Monocytes Relative: 8 % (ref 3–12)
Neutro Abs: 2.7 10*3/uL (ref 1.7–7.7)
Neutrophils Relative %: 49 % (ref 43–77)
Platelets: 189 10*3/uL (ref 150–400)
RBC: 5.35 MIL/uL (ref 4.22–5.81)
RDW: 14.8 % (ref 11.5–15.5)
WBC: 5.6 10*3/uL (ref 4.0–10.5)

## 2014-12-12 LAB — POCT GLYCOSYLATED HEMOGLOBIN (HGB A1C): Hemoglobin A1C: 5.8

## 2014-12-12 LAB — LIPID PANEL
Cholesterol: 143 mg/dL (ref 0–200)
HDL: 76 mg/dL (ref >=40)
LDL Cholesterol: 48 mg/dL (ref 0–99)
Total CHOL/HDL Ratio: 1.9 Ratio
Triglycerides: 93 mg/dL (ref <150)
VLDL: 19 mg/dL (ref 0–40)

## 2014-12-12 LAB — TSH: TSH: 0.755 u[IU]/mL (ref 0.350–4.500)

## 2014-12-12 NOTE — Progress Notes (Signed)
Pt is here for his 64 y/o male PE Voices no new concerns; alert, no signs of acute distress.

## 2014-12-12 NOTE — Patient Instructions (Signed)
DASH Eating Plan DASH stands for "Dietary Approaches to Stop Hypertension." The DASH eating plan is a healthy eating plan that has been shown to reduce high blood pressure (hypertension). Additional health benefits may include reducing the risk of type 2 diabetes mellitus, heart disease, and stroke. The DASH eating plan may also help with weight loss. WHAT DO I NEED TO KNOW ABOUT THE DASH EATING PLAN? For the DASH eating plan, you will follow these general guidelines:  Choose foods with a percent daily value for sodium of less than 5% (as listed on the food label).  Use salt-free seasonings or herbs instead of table salt or sea salt.  Check with your health care provider or pharmacist before using salt substitutes.  Eat lower-sodium products, often labeled as "lower sodium" or "no salt added."  Eat fresh foods.  Eat more vegetables, fruits, and low-fat dairy products.  Choose whole grains. Look for the word "whole" as the first word in the ingredient list.  Choose fish and skinless chicken or turkey more often than red meat. Limit fish, poultry, and meat to 6 oz (170 g) each day.  Limit sweets, desserts, sugars, and sugary drinks.  Choose heart-healthy fats.  Limit cheese to 1 oz (28 g) per day.  Eat more home-cooked food and less restaurant, buffet, and fast food.  Limit fried foods.  Cook foods using methods other than frying.  Limit canned vegetables. If you do use them, rinse them well to decrease the sodium.  When eating at a restaurant, ask that your food be prepared with less salt, or no salt if possible. WHAT FOODS CAN I EAT? Seek help from a dietitian for individual calorie needs. Grains Whole grain or whole wheat bread. Brown rice. Whole grain or whole wheat pasta. Quinoa, bulgur, and whole grain cereals. Low-sodium cereals. Corn or whole wheat flour tortillas. Whole grain cornbread. Whole grain crackers. Low-sodium crackers. Vegetables Fresh or frozen vegetables  (raw, steamed, roasted, or grilled). Low-sodium or reduced-sodium tomato and vegetable juices. Low-sodium or reduced-sodium tomato sauce and paste. Low-sodium or reduced-sodium canned vegetables.  Fruits All fresh, canned (in natural juice), or frozen fruits. Meat and Other Protein Products Ground beef (85% or leaner), grass-fed beef, or beef trimmed of fat. Skinless chicken or turkey. Ground chicken or turkey. Pork trimmed of fat. All fish and seafood. Eggs. Dried beans, peas, or lentils. Unsalted nuts and seeds. Unsalted canned beans. Dairy Low-fat dairy products, such as skim or 1% milk, 2% or reduced-fat cheeses, low-fat ricotta or cottage cheese, or plain low-fat yogurt. Low-sodium or reduced-sodium cheeses. Fats and Oils Tub margarines without trans fats. Light or reduced-fat mayonnaise and salad dressings (reduced sodium). Avocado. Safflower, olive, or canola oils. Natural peanut or almond butter. Other Unsalted popcorn and pretzels. The items listed above may not be a complete list of recommended foods or beverages. Contact your dietitian for more options. WHAT FOODS ARE NOT RECOMMENDED? Grains White bread. White pasta. White rice. Refined cornbread. Bagels and croissants. Crackers that contain trans fat. Vegetables Creamed or fried vegetables. Vegetables in a cheese sauce. Regular canned vegetables. Regular canned tomato sauce and paste. Regular tomato and vegetable juices. Fruits Dried fruits. Canned fruit in light or heavy syrup. Fruit juice. Meat and Other Protein Products Fatty cuts of meat. Ribs, chicken wings, bacon, sausage, bologna, salami, chitterlings, fatback, hot dogs, bratwurst, and packaged luncheon meats. Salted nuts and seeds. Canned beans with salt. Dairy Whole or 2% milk, cream, half-and-half, and cream cheese. Whole-fat or sweetened yogurt. Full-fat   cheeses or blue cheese. Nondairy creamers and whipped toppings. Processed cheese, cheese spreads, or cheese  curds. Condiments Onion and garlic salt, seasoned salt, table salt, and sea salt. Canned and packaged gravies. Worcestershire sauce. Tartar sauce. Barbecue sauce. Teriyaki sauce. Soy sauce, including reduced sodium. Steak sauce. Fish sauce. Oyster sauce. Cocktail sauce. Horseradish. Ketchup and mustard. Meat flavorings and tenderizers. Bouillon cubes. Hot sauce. Tabasco sauce. Marinades. Taco seasonings. Relishes. Fats and Oils Butter, stick margarine, lard, shortening, ghee, and bacon fat. Coconut, palm kernel, or palm oils. Regular salad dressings. Other Pickles and olives. Salted popcorn and pretzels. The items listed above may not be a complete list of foods and beverages to avoid. Contact your dietitian for more information. WHERE CAN I FIND MORE INFORMATION? National Heart, Lung, and Blood Institute: www.nhlbi.nih.gov/health/health-topics/topics/dash/ Document Released: 06/06/2011 Document Revised: 11/01/2013 Document Reviewed: 04/21/2013 [employer] Patient Information 2015 [employer]. This information is not intended to replace advice given to you by your health care provider. Make sure you discuss any questions you have with your health care provider. Smoking Cessation Quitting smoking is important to your health and has many advantages. However, it is not always easy to quit since nicotine is a very addictive drug. Oftentimes, people try 3 times or more before being able to quit. This document explains the best ways for you to prepare to quit smoking. Quitting takes hard work and a lot of effort, but you can do it. ADVANTAGES OF QUITTING SMOKING  You will live longer, feel better, and live better.  Your body will feel the impact of quitting smoking almost immediately.  Within 20 minutes, blood pressure decreases. Your pulse returns to its normal level.  After 8 hours, carbon monoxide levels in the blood return to normal. Your oxygen level increases.  After 24 hours, the chance of  having a heart attack starts to decrease. Your breath, hair, and body stop smelling like smoke.  After 48 hours, damaged nerve endings begin to recover. Your sense of taste and smell improve.  After 72 hours, the body is virtually free of nicotine. Your bronchial tubes relax and breathing becomes easier.  After 2 to 12 weeks, lungs can hold more air. Exercise becomes easier and circulation improves.  The risk of having a heart attack, stroke, cancer, or lung disease is greatly reduced.  After 1 year, the risk of coronary heart disease is cut in half.  After 5 years, the risk of stroke falls to the same as a nonsmoker.  After 10 years, the risk of lung cancer is cut in half and the risk of other cancers decreases significantly.  After 15 years, the risk of coronary heart disease drops, usually to the level of a nonsmoker.  If you are pregnant, quitting smoking will improve your chances of having a healthy baby.  The people you live with, especially any children, will be healthier.  You will have extra money to spend on things other than cigarettes. QUESTIONS TO THINK ABOUT BEFORE ATTEMPTING TO QUIT You may want to talk about your answers with your health care provider.  Why do you want to quit?  If you tried to quit in the past, what helped and what did not?  What will be the most difficult situations for you after you quit? How will you plan to handle them?  Who can help you through the tough times? Your family? Friends? A health care provider?  What pleasures do you get from smoking? What ways can you still get pleasure   if you quit? Here are some questions to ask your health care provider:  How can you help me to be successful at quitting?  What medicine do you think would be best for me and how should I take it?  What should I do if I need more help?  What is smoking withdrawal like? How can I get information on withdrawal? GET READY  Set a quit date.  Change your  environment by getting rid of all cigarettes, ashtrays, matches, and lighters in your home, car, or work. Do not let people smoke in your home.  Review your past attempts to quit. Think about what worked and what did not. GET SUPPORT AND ENCOURAGEMENT You have a better chance of being successful if you have help. You can get support in many ways.  Tell your family, friends, and coworkers that you are going to quit and need their support. Ask them not to smoke around you.  Get individual, group, or telephone counseling and support. Programs are available at local hospitals and health centers. Call your local health department for information about programs in your area.  Spiritual beliefs and practices may help some smokers quit.  Download a "quit meter" on your computer to keep track of quit statistics, such as how long you have gone without smoking, cigarettes not smoked, and money saved.  Get a self-help book about quitting smoking and staying off tobacco. LEARN NEW SKILLS AND BEHAVIORS  Distract yourself from urges to smoke. Talk to someone, go for a walk, or occupy your time with a task.  Change your normal routine. Take a different route to work. Drink tea instead of coffee. Eat breakfast in a different place.  Reduce your stress. Take a hot bath, exercise, or read a book.  Plan something enjoyable to do every day. Reward yourself for not smoking.  Explore interactive web-based programs that specialize in helping you quit. GET MEDICINE AND USE IT CORRECTLY Medicines can help you stop smoking and decrease the urge to smoke. Combining medicine with the above behavioral methods and support can greatly increase your chances of successfully quitting smoking.  Nicotine replacement therapy helps deliver nicotine to your body without the negative effects and risks of smoking. Nicotine replacement therapy includes nicotine gum, lozenges, inhalers, nasal sprays, and skin patches. Some may be  available over-the-counter and others require a prescription.  Antidepressant medicine helps people abstain from smoking, but how this works is unknown. This medicine is available by prescription.  Nicotinic receptor partial agonist medicine simulates the effect of nicotine in your brain. This medicine is available by prescription. Ask your health care provider for advice about which medicines to use and how to use them based on your health history. Your health care provider will tell you what side effects to look out for if you choose to be on a medicine or therapy. Carefully read the information on the package. Do not use any other product containing nicotine while using a nicotine replacement product.  RELAPSE OR DIFFICULT SITUATIONS Most relapses occur within the first 3 months after quitting. Do not be discouraged if you start smoking again. Remember, most people try several times before finally quitting. You may have symptoms of withdrawal because your body is used to nicotine. You may crave cigarettes, be irritable, feel very hungry, cough often, get headaches, or have difficulty concentrating. The withdrawal symptoms are only temporary. They are strongest when you first quit, but they will go away within 10-14 days. To reduce the   chances of relapse, try to:  Avoid drinking alcohol. Drinking lowers your chances of successfully quitting.  Reduce the amount of caffeine you consume. Once you quit smoking, the amount of caffeine in your body increases and can give you symptoms, such as a rapid heartbeat, sweating, and anxiety.  Avoid smokers because they can make you want to smoke.  Do not let weight gain distract you. Many smokers will gain weight when they quit, usually less than 10 pounds. Eat a healthy diet and stay active. You can always lose the weight gained after you quit.  Find ways to improve your mood other than smoking. FOR MORE INFORMATION  www.smokefree.gov  Document Released:  06/11/2001 Document Revised: 11/01/2013 Document Reviewed: 09/26/2011 [facility] Patient Information 2015 [place], [place]. This information is not intended to replace advice given to you by your health care provider. Make sure you discuss any questions you have with your health care provider. Hypertension Hypertension, commonly called high blood pressure, is when the force of blood pumping through your arteries is too strong. Your arteries are the blood vessels that carry blood from your heart throughout your body. A blood pressure reading consists of a higher number over a lower number, such as 110/72. The higher number (systolic) is the pressure inside your arteries when your heart pumps. The lower number (diastolic) is the pressure inside your arteries when your heart relaxes. Ideally you want your blood pressure below 120/80. Hypertension forces your heart to work harder to pump blood. Your arteries may become narrow or stiff. Having hypertension puts you at risk for heart disease, stroke, and other problems.  RISK FACTORS Some risk factors for high blood pressure are controllable. Others are not.  Risk factors you cannot control include:   Race. You may be at higher risk if you are African American.  Age. Risk increases with age.  Gender. Men are at higher risk than women before age 85 years. After age 28, women are at higher risk than men. Risk factors you can control include:  Not getting enough exercise or physical activity.  Being overweight.  Getting too much fat, sugar, calories, or salt in your diet.  Drinking too much alcohol. SIGNS AND SYMPTOMS Hypertension does not usually cause signs or symptoms. Extremely high blood pressure (hypertensive crisis) may cause headache, anxiety, shortness of breath, and nosebleed. DIAGNOSIS  To check if you have hypertension, your health care provider will measure your blood pressure while you are seated, with your arm held at the level of your  heart. It should be measured at least twice using the same arm. Certain conditions can cause a difference in blood pressure between your right and left arms. A blood pressure reading that is higher than normal on one occasion does not mean that you need treatment. If one blood pressure reading is high, ask your health care provider about having it checked again. TREATMENT  Treating high blood pressure includes making lifestyle changes and possibly taking medicine. Living a healthy lifestyle can help lower high blood pressure. You may need to change some of your habits. Lifestyle changes may include:  Following the DASH diet. This diet is high in fruits, vegetables, and whole grains. It is low in salt, red meat, and added sugars.  Getting at least 2 hours of brisk physical activity every week.  Losing weight if necessary.  Not smoking.  Limiting alcoholic beverages.  Learning ways to reduce stress. If lifestyle changes are not enough to get your blood pressure under control,  your health care provider may prescribe medicine. You may need to take more than one. Work closely with your health care provider to understand the risks and benefits. HOME CARE INSTRUCTIONS  Have your blood pressure rechecked as directed by your health care provider.   Take medicines only as directed by your health care provider. Follow the directions carefully. Blood pressure medicines must be taken as prescribed. The medicine does not work as well when you skip doses. Skipping doses also puts you at risk for problems.   Do not smoke.   Monitor your blood pressure at home as directed by your health care provider. SEEK MEDICAL CARE IF:   You think you are having a reaction to medicines taken.  You have recurrent headaches or feel dizzy.  You have swelling in your ankles.  You have trouble with your vision. SEEK IMMEDIATE MEDICAL CARE IF:  You develop a severe headache or confusion.  You have unusual  weakness, numbness, or feel faint.  You have severe chest or abdominal pain.  You vomit repeatedly.  You have trouble breathing. MAKE SURE YOU:   Understand these instructions.  Will watch your condition.  Will get help right away if you are not doing well or get worse. Document Released: 06/17/2005 Document Revised: 11/01/2013 Document Reviewed: 04/09/2013 [facility] Patient Information 2015 [place], [place]. This information is not intended to replace advice given to you by your health care provider. Make sure you discuss any questions you have with your health care provider.

## 2014-12-12 NOTE — Progress Notes (Signed)
Patient ID: LISTER BRIZZI, male   DOB: 1950-10-07, 64 y.o.   MRN: [id number]   Steven Delacruz, is a 64 y.o. male  KXF:[id number]  [id number]  DOB - 27-Jan-1951  Chief Complaint  Patient presents with  . Annual Exam    64 y/o PE        Subjective:   Steven Delacruz is a 64 y.o. male here today for a follow up visit. Patient has history of hypertension, dyslipidemia, remote MI and chronic pain syndrome here today for annual physical examination. Patient has no cough present. Is compliant with medications, claiming blood pressure is controlled. He has however not been compliant with nutritional advice, he endorses eating junk food including PORK and lots of salt. He continues to smoke heavily about 1 pack of cigarettes per day, he drinks two 40 ounces of beer every day, is contemplating quitting but not yet ready. Patient has No headache, No chest pain, No abdominal pain - No Nausea, No new weakness tingling or numbness, No Cough - SOB. Patient does not need to refill her medications today.  Problem  Essential Hypertension, Benign  Annual Physical Exam    ALLERGIES: Allergies  Allergen Reactions  . Lisinopril Swelling    PAST MEDICAL HISTORY: Past Medical History  Diagnosis Date  . Hypertension   . Chronic back pain   . Right shoulder pain   . Myocardial infarction   . Headache(784.0)   . Abnormal CT scan, pancreas - ? focal pancreatitis  05/13/2013  . Abdominal aortic atherosclerosis 05/13/2013    MEDICATIONS AT HOME: Prior to Admission medications   Medication Sig Start Date End Date Taking? Authorizing Provider  acetaminophen-codeine (TYLENOL #3) 300-30 MG per tablet Take 1 tablet by mouth every 8 (eight) hours as needed. 12/01/14  Yes Tresa Garter, MD  amLODipine (NORVASC) 10 MG tablet Take 1 tablet (10 mg total) by mouth daily. 08/08/14  Yes Tresa Garter, MD  atenolol (TENORMIN) 100 MG tablet Take 1 tablet (100 mg total) by mouth daily. 08/08/14  Yes  Tresa Garter, MD  hydrochlorothiazide (HYDRODIURIL) 25 MG tablet Take 1 tablet (25 mg total) by mouth daily. 08/08/14  Yes Tresa Garter, MD  oxyCODONE-acetaminophen (PERCOCET) 10-325 MG per tablet Take 1 tablet by mouth every 8 (eight) hours as needed. 06/28/14  Yes Historical Provider, MD  aspirin EC 81 MG tablet Take 1 tablet (81 mg total) by mouth daily. Patient not taking: Reported on 08/08/2014 07/12/14   Orson Eva, MD  potassium chloride SA (K-DUR,KLOR-CON) 20 MEQ tablet Take 1 tablet (20 mEq total) by mouth daily. Patient not taking: Reported on 12/01/2014 07/13/14   Orson Eva, MD  tadalafil (CIALIS) 20 MG tablet Take 0.5-1 tablets (10-20 mg total) by mouth every other day as needed for erectile dysfunction. Patient not taking: Reported on 08/08/2014 12/21/13   Tresa Garter, MD     Objective:   Filed Vitals:   12/12/14 1022  BP: 140/82  Pulse: 52  Temp: 97.7 F (36.5 C)  TempSrc: Oral  Resp: 16  Height: 6' 0.05" (1.83 m)  Weight: 215 lb (97.523 kg)  SpO2: 96%    Exam General appearance : Awake, alert, not in any distress. Speech Clear. Not toxic looking HEENT: Atraumatic and Normocephalic, pupils equally reactive to light and accomodation Neck: supple, no JVD. No cervical lymphadenopathy.  Chest:Good air entry bilaterally, no added sounds  CVS: S1 S2 regular, no murmurs.  Abdomen: Bowel sounds present, Non tender and not distended with  no gaurding, rigidity or rebound. Extremities: B/L Lower Ext shows no edema, both legs are warm to touch Neurology: Awake alert, and oriented X 3, CN II-XII intact, Non focal Rectal Exam: Normal external anal sphincter with normal tone, prostate is normal size, smooth surface, no significant abnormality detected digitally  Data Review Lab Results  Component Value Date   HGBA1C 5.4 12/21/2013   HGBA1C 6.3* 03/03/2013   HGBA1C * 07/07/2010    5.7 (NOTE)                                                                        According to the ADA Clinical Practice Recommendations for 2011, when HbA1c is used as a screening test:   >=6.5%   Diagnostic of Diabetes Mellitus           (if abnormal result  is confirmed)  5.7-6.4%   Increased risk of developing Diabetes Mellitus  References:Diagnosis and Classification of Diabetes Mellitus,Diabetes LMBE,6754,49(EEFEO 1):S62-S69 and Standards of Medical Care in         Diabetes - 2011,Diabetes Care,2011,34  (Suppl 1):S11-S61.     Assessment & Plan   1. Hypertriglyceridemia  - Lipid panel  To address this please limit saturated fat to no more than 7% of your calories, limit cholesterol to 200 mg/day, increase fiber and exercise as tolerated. If needed we may add another cholesterol lowering medication to your regimen.   2. Essential hypertension, benign  - CBC with Differential/Platelet - COMPLETE METABOLIC PANEL WITH GFR  - We have discussed target BP range and blood pressure goal - I have advised patient to check BP regularly and to call us back or report to clinic if the numbers are consistently higher than 140/90  - We discussed the importance of compliance with medical therapy and DASH diet recommended, consequences of uncontrolled hypertension discussed.  - continue current BP medications  3. Colon cancer screening  - HM COLONOSCOPY - Ambulatory referral to Gastroenterology  4. Annual physical exam  - POCT glycosylated hemoglobin (Hb A1C) - TSH - Urinalysis, Complete - HIV antibody (with reflex) - PSA  The patient was counseled on the dangers of tobacco use, and was advised to quit. Reviewed strategies to maximize success, including removing cigarettes and smoking materials from environment, stress management and support of family/friends.   Patient have been counseled extensively about nutrition and exercise  Return in about 3 months (around 03/14/2015) for Routine Follow Up, Follow up HTN, Follow up Pain and comorbidities.  The patient was given  clear instructions to go to ER or return to medical center if symptoms don't improve, worsen or new problems develop. The patient verbalized understanding. The patient was told to call to get lab results if they haven't heard anything in the next week.   This note has been created with Surveyor, quantity. Any transcriptional errors are unintentional.    Angelica Chessman, MD, [place], [place], [place], [place] and [facility] [place], [place]   12/12/2014, 11:06 AM

## 2014-12-13 ENCOUNTER — Telehealth: Payer: Self-pay

## 2014-12-13 ENCOUNTER — Other Ambulatory Visit: Payer: Self-pay | Admitting: Internal Medicine

## 2014-12-13 LAB — URINALYSIS, COMPLETE
Bacteria, UA: NONE SEEN
Bilirubin Urine: NEGATIVE
Casts: NONE SEEN
Crystals: NONE SEEN
Glucose, UA: NEGATIVE mg/dL
Hgb urine dipstick: NEGATIVE
Ketones, ur: NEGATIVE mg/dL
Leukocytes, UA: NEGATIVE
Nitrite: NEGATIVE
Protein, ur: NEGATIVE mg/dL
Specific Gravity, Urine: 1.006 (ref 1.005–1.030)
Squamous Epithelial / LPF: NONE SEEN
Urobilinogen, UA: 0.2 mg/dL (ref 0.0–1.0)
pH: 5.5 (ref 5.0–8.0)

## 2014-12-13 LAB — PSA: PSA: 0.55 ng/mL (ref <=4.00)

## 2014-12-13 LAB — HIV ANTIBODY (ROUTINE TESTING W REFLEX): HIV 1&2 Ab, 4th Generation: NONREACTIVE

## 2014-12-13 MED ORDER — POTASSIUM CHLORIDE CRYS ER 20 MEQ PO TBCR: 20.0000 meq | EXTENDED_RELEASE_TABLET | Freq: Every day | ORAL | Status: DC

## 2014-12-13 NOTE — Telephone Encounter (Signed)
Nurse called patient, patient verified date of birth. Patient aware of normal labs except slightly low potassium and agrees to pick up potassium prescription at pharmacy. Patient aware of negative HIV as of 12/12/14. Patient voices understanding and has no questions at this time.

## 2014-12-13 NOTE — Telephone Encounter (Signed)
-----   Message from Tresa Garter, MD sent at 12/13/2014 12:17 PM EDT ----- Please inform patient that his lab results are normal except for slightly low potassium level. HIV is negative as of 12/12/2014. I have sent a prescription for potassium tablets to the pharmacy for pickup.

## 2015-01-09 DIAGNOSIS — H2513 Age-related nuclear cataract, bilateral: Secondary | ICD-10-CM | POA: Diagnosis not present

## 2015-01-09 DIAGNOSIS — H52203 Unspecified astigmatism, bilateral: Secondary | ICD-10-CM | POA: Diagnosis not present

## 2015-01-26 DIAGNOSIS — M545 Low back pain: Secondary | ICD-10-CM | POA: Diagnosis not present

## 2015-01-26 DIAGNOSIS — M47817 Spondylosis without myelopathy or radiculopathy, lumbosacral region: Secondary | ICD-10-CM | POA: Diagnosis not present

## 2015-02-28 ENCOUNTER — Telehealth: Payer: Self-pay | Admitting: Internal Medicine

## 2015-02-28 NOTE — Telephone Encounter (Signed)
Patient came in requesting a medication refill for, amLODipine (NORVASC) and atenolol (TENORMIN). Please follow up.

## 2015-03-16 ENCOUNTER — Ambulatory Visit: Payer: Medicare Other | Attending: Internal Medicine | Admitting: Internal Medicine

## 2015-03-16 ENCOUNTER — Encounter: Payer: Self-pay | Admitting: Internal Medicine

## 2015-03-16 VITALS — BP 146/86 | HR 68 | Temp 97.9°F | Resp 18 | Ht 72.0 in | Wt 201.8 lb

## 2015-03-16 DIAGNOSIS — M545 Low back pain, unspecified: Secondary | ICD-10-CM

## 2015-03-16 DIAGNOSIS — I10 Essential (primary) hypertension: Secondary | ICD-10-CM

## 2015-03-16 DIAGNOSIS — M25511 Pain in right shoulder: Secondary | ICD-10-CM | POA: Diagnosis not present

## 2015-03-16 MED ORDER — POTASSIUM CHLORIDE CRYS ER 20 MEQ PO TBCR: 20.0000 meq | EXTENDED_RELEASE_TABLET | Freq: Every day | ORAL | Status: DC

## 2015-03-16 MED ORDER — ATENOLOL 100 MG PO TABS: 100.0000 mg | ORAL_TABLET | Freq: Every day | ORAL | Status: DC

## 2015-03-16 MED ORDER — ACETAMINOPHEN-CODEINE #3 300-30 MG PO TABS: 1.0000 | ORAL_TABLET | Freq: Three times a day (TID) | ORAL | Status: DC | PRN

## 2015-03-16 MED ORDER — HYDROCHLOROTHIAZIDE 25 MG PO TABS: 25.0000 mg | ORAL_TABLET | Freq: Every day | ORAL | Status: DC

## 2015-03-16 MED ORDER — AMLODIPINE BESYLATE 10 MG PO TABS: 10.0000 mg | ORAL_TABLET | Freq: Every day | ORAL | Status: DC

## 2015-03-16 NOTE — Progress Notes (Signed)
Patient ID: Steven Delacruz, male   DOB: 09/12/1950, 64 y.o.   MRN: [id number]   Steven Delacruz, is a 64 y.o. male  [id number]  [id number]  DOB - 12-17-1950  Chief Complaint  Patient presents with  . Follow-up        Subjective:   Steven Delacruz is a 64 y.o. male here today for a follow up visit. Patient has history of hypertension, dyslipidemia, remote MI and chronic pain syndrome. He is here today for routine follow-up and for medication refills. He has no new complaints today. He claims compliance with medications but ran out of his medication about a week ago. He has recently quit drinking alcohol but continues to smoke cigarettes about half a pack per day, used to be up to 1 pack per day. He is working on quitting. He denies any unusual feeling, no sign or symptom of depression or anxiety. Patient has No headache, No chest pain, No abdominal pain - No Nausea, No new weakness tingling or numbness, No Cough - SOB.  No problems updated.  ALLERGIES: Allergies  Allergen Reactions  . Lisinopril Swelling    PAST MEDICAL HISTORY: Past Medical History  Diagnosis Date  . Hypertension   . Chronic back pain   . Right shoulder pain   . Myocardial infarction   . Headache(784.0)   . Abnormal CT scan, pancreas - ? focal pancreatitis  05/13/2013  . Abdominal aortic atherosclerosis 05/13/2013    MEDICATIONS AT HOME: Prior to Admission medications   Medication Sig Start Date End Date Taking? Authorizing Provider  acetaminophen-codeine (TYLENOL #3) 300-30 MG per tablet Take 1 tablet by mouth every 8 (eight) hours as needed. 03/16/15   Tresa Garter, MD  amLODipine (NORVASC) 10 MG tablet Take 1 tablet (10 mg total) by mouth daily. 03/16/15   Tresa Garter, MD  aspirin EC 81 MG tablet Take 1 tablet (81 mg total) by mouth daily. Patient not taking: Reported on 08/08/2014 07/12/14   Orson Eva, MD  atenolol (TENORMIN) 100 MG tablet Take 1 tablet (100 mg total) by mouth  daily. 03/16/15   Tresa Garter, MD  hydrochlorothiazide (HYDRODIURIL) 25 MG tablet Take 1 tablet (25 mg total) by mouth daily. 03/16/15   Tresa Garter, MD  oxyCODONE-acetaminophen (PERCOCET) 10-325 MG per tablet Take 1 tablet by mouth every 8 (eight) hours as needed. 06/28/14   Historical Provider, MD  potassium chloride SA (K-DUR,KLOR-CON) 20 MEQ tablet Take 1 tablet (20 mEq total) by mouth daily. 03/16/15   Tresa Garter, MD  tadalafil (CIALIS) 20 MG tablet Take 0.5-1 tablets (10-20 mg total) by mouth every other day as needed for erectile dysfunction. Patient not taking: Reported on 08/08/2014 12/21/13   Tresa Garter, MD     Objective:   Filed Vitals:   03/16/15 0922  BP: 146/86  Pulse: 68  Temp: 97.9 F (36.6 C)  TempSrc: Oral  Resp: 18  Height: 6' (1.829 m)  Weight: 201 lb 12.8 oz (91.536 kg)  SpO2: 96%    Exam General appearance : Awake, alert, not in any distress. Speech Clear. Not toxic looking HEENT: Atraumatic and Normocephalic, pupils equally reactive to light and accomodation Neck: supple, no JVD. No cervical lymphadenopathy.  Chest:Good air entry bilaterally, no added sounds  CVS: S1 S2 regular, no murmurs.  Abdomen: Full, Bowel sounds present, Non tender and not distended with no gaurding, rigidity or rebound. Extremities: B/L Lower Ext shows no edema, both legs are warm to touch Neurology:  Awake alert, and oriented X 3, CN II-XII intact, Non focal Skin:No Rash  Data Review Lab Results  Component Value Date   HGBA1C 5.80 12/12/2014   HGBA1C 5.4 12/21/2013   HGBA1C 6.3* 03/03/2013     Assessment & Plan   1. Essential hypertension  - potassium chloride SA (K-DUR,KLOR-CON) 20 MEQ tablet; Take 1 tablet (20 mEq total) by mouth daily.  Dispense: 30 tablet; Refill: 0 - hydrochlorothiazide (HYDRODIURIL) 25 MG tablet; Take 1 tablet (25 mg total) by mouth daily.  Dispense: 90 tablet; Refill: 3 - atenolol (TENORMIN) 100 MG tablet; Take 1  tablet (100 mg total) by mouth daily.  Dispense: 90 tablet; Refill: 3 - amLODipine (NORVASC) 10 MG tablet; Take 1 tablet (10 mg total) by mouth daily.  Dispense: 90 tablet; Refill: 3  We have discussed target BP range and blood pressure goal. I have advised patient to check BP regularly and to call us back or report to clinic if the numbers are consistently higher than 140/90. We discussed the importance of compliance with medical therapy and DASH diet recommended, consequences of uncontrolled hypertension discussed.  - continue current BP medications  2. Pain in joint, shoulder region, right  - acetaminophen-codeine (TYLENOL #3) 300-30 MG per tablet; Take 1 tablet by mouth every 8 (eight) hours as needed.  Dispense: 90 tablet; Refill: 0  3. Back pain at L4-L5 level  - acetaminophen-codeine (TYLENOL #3) 300-30 MG per tablet; Take 1 tablet by mouth every 8 (eight) hours as needed.  Dispense: 90 tablet; Refill: 0  Steven Delacruz was counseled on the dangers of tobacco use, and was advised to quit. Reviewed strategies to maximize success, including removing cigarettes and smoking materials from environment, stress management and support of family/friends.  Patient have been counseled extensively about nutrition and exercise  Return in about 4 weeks (around 04/13/2015) for CBG, Lab/Nurse Visit.and for medication reconciliation because patient could not verify the dosages of his antihypertensives, he did not bring his med bottles as previously instructed.   The patient was given clear instructions to go to ER or return to medical center if symptoms don't improve, worsen or new problems develop. The patient verbalized understanding. The patient was told to call to get lab results if they haven't heard anything in the next week.   This note has been created with Surveyor, quantity. Any transcriptional errors are unintentional.    Steven Chessman, MD, [place],  Karilyn Cota, [place] and [place], [place]   03/16/2015, 9:58 AM

## 2015-03-16 NOTE — Progress Notes (Signed)
Patient has been out of HTN medication and Tylenol 3 in over a week including all other medications. Patient did not pick up potassium medication. Patient denies pain at the moment. Patient states he has pain at night.

## 2015-03-16 NOTE — Patient Instructions (Signed)
Back Pain, Adult Low back pain is very common. About 1 in 5 people have back pain.The cause of low back pain is rarely dangerous. The pain often gets better over time.About half of people with a sudden onset of back pain feel better in just 2 weeks. About 8 in 10 people feel better by 6 weeks.  CAUSES Some common causes of back pain include:  Strain of the muscles or ligaments supporting the spine.  Wear and tear (degeneration) of the spinal discs.  Arthritis.  Direct injury to the back. DIAGNOSIS Most of the time, the direct cause of low back pain is not known.However, back pain can be treated effectively even when the exact cause of the pain is unknown.Answering your caregiver's questions about your overall health and symptoms is one of the most accurate ways to make sure the cause of your pain is not dangerous. If your caregiver needs more information, he or she may order lab work or imaging tests (X-rays or MRIs).However, even if imaging tests show changes in your back, this usually does not require surgery. HOME CARE INSTRUCTIONS For many people, back pain returns.Since low back pain is rarely dangerous, it is often a condition that people can learn to manageon their own.   Remain active. It is stressful on the back to sit or stand in one place. Do not sit, drive, or stand in one place for more than 30 minutes at a time. Take short walks on level surfaces as soon as pain allows.Try to increase the length of time you walk each day.  Do not stay in bed.Resting more than 1 or 2 days can delay your recovery.  Do not avoid exercise or work.Your body is made to move.It is not dangerous to be active, even though your back may hurt.Your back will likely heal faster if you return to being active before your pain is gone.  Pay attention to your body when you bend and lift. Many people have less discomfortwhen lifting if they bend their knees, keep the load close to their bodies,and  avoid twisting. Often, the most comfortable positions are those that put less stress on your recovering back.  Find a comfortable position to sleep. Use a firm mattress and lie on your side with your knees slightly bent. If you lie on your back, put a pillow under your knees.  Only take over-the-counter or prescription medicines as directed by your caregiver. Over-the-counter medicines to reduce pain and inflammation are often the most helpful.Your caregiver may prescribe muscle relaxant drugs.These medicines help dull your pain so you can more quickly return to your normal activities and healthy exercise.  Put ice on the injured area.  Put ice in a plastic bag.  Place a towel between your skin and the bag.  Leave the ice on for 15-20 minutes, 03-04 times a day for the first 2 to 3 days. After that, ice and heat may be alternated to reduce pain and spasms.  Ask your caregiver about trying back exercises and gentle massage. This may be of some benefit.  Avoid feeling anxious or stressed.Stress increases muscle tension and can worsen back pain.It is important to recognize when you are anxious or stressed and learn ways to manage it.Exercise is a great option. SEEK MEDICAL CARE IF:  You have pain that is not relieved with rest or medicine.  You have pain that does not improve in 1 week.  You have new symptoms.  You are generally not feeling well. SEEK   IMMEDIATE MEDICAL CARE IF:   You have pain that radiates from your back into your legs.  You develop new bowel or bladder control problems.  You have unusual weakness or numbness in your arms or legs.  You develop nausea or vomiting.  You develop abdominal pain.  You feel faint. Document Released: 06/17/2005 Document Revised: 12/17/2011 Document Reviewed: 10/19/2013 ExitCare Patient Information 2015 [employer]. This information is not intended to replace advice given to you by your health care provider. Make sure you  discuss any questions you have with your health care provider. DASH Eating Plan DASH stands for "Dietary Approaches to Stop Hypertension." The DASH eating plan is a healthy eating plan that has been shown to reduce high blood pressure (hypertension). Additional health benefits may include reducing the risk of type 2 diabetes mellitus, heart disease, and stroke. The DASH eating plan may also help with weight loss. WHAT DO I NEED TO KNOW ABOUT THE DASH EATING PLAN? For the DASH eating plan, you will follow these general guidelines:  Choose foods with a percent daily value for sodium of less than 5% (as listed on the food label).  Use salt-free seasonings or herbs instead of table salt or sea salt.  Check with your health care provider or pharmacist before using salt substitutes.  Eat lower-sodium products, often labeled as "lower sodium" or "no salt added."  Eat fresh foods.  Eat more vegetables, fruits, and low-fat dairy products.  Choose whole grains. Look for the word "whole" as the first word in the ingredient list.  Choose fish and skinless chicken or turkey more often than red meat. Limit fish, poultry, and meat to 6 oz (170 g) each day.  Limit sweets, desserts, sugars, and sugary drinks.  Choose heart-healthy fats.  Limit cheese to 1 oz (28 g) per day.  Eat more home-cooked food and less restaurant, buffet, and fast food.  Limit fried foods.  Cook foods using methods other than frying.  Limit canned vegetables. If you do use them, rinse them well to decrease the sodium.  When eating at a restaurant, ask that your food be prepared with less salt, or no salt if possible. WHAT FOODS CAN I EAT? Seek help from a dietitian for individual calorie needs. Grains Whole grain or whole wheat bread. Brown rice. Whole grain or whole wheat pasta. Quinoa, bulgur, and whole grain cereals. Low-sodium cereals. Corn or whole wheat flour tortillas. Whole grain cornbread. Whole grain crackers.  Low-sodium crackers. Vegetables Fresh or frozen vegetables (raw, steamed, roasted, or grilled). Low-sodium or reduced-sodium tomato and vegetable juices. Low-sodium or reduced-sodium tomato sauce and paste. Low-sodium or reduced-sodium canned vegetables.  Fruits All fresh, canned (in natural juice), or frozen fruits. Meat and Other Protein Products Ground beef (85% or leaner), grass-fed beef, or beef trimmed of fat. Skinless chicken or turkey. Ground chicken or turkey. Pork trimmed of fat. All fish and seafood. Eggs. Dried beans, peas, or lentils. Unsalted nuts and seeds. Unsalted canned beans. Dairy Low-fat dairy products, such as skim or 1% milk, 2% or reduced-fat cheeses, low-fat ricotta or cottage cheese, or plain low-fat yogurt. Low-sodium or reduced-sodium cheeses. Fats and Oils Tub margarines without trans fats. Light or reduced-fat mayonnaise and salad dressings (reduced sodium). Avocado. Safflower, olive, or canola oils. Natural peanut or almond butter. Other Unsalted popcorn and pretzels. The items listed above may not be a complete list of recommended foods or beverages. Contact your dietitian for more options. WHAT FOODS ARE NOT RECOMMENDED? Grains White bread.   White pasta. White rice. Refined cornbread. Bagels and croissants. Crackers that contain trans fat. Vegetables Creamed or fried vegetables. Vegetables in a cheese sauce. Regular canned vegetables. Regular canned tomato sauce and paste. Regular tomato and vegetable juices. Fruits Dried fruits. Canned fruit in light or heavy syrup. Fruit juice. Meat and Other Protein Products Fatty cuts of meat. Ribs, chicken wings, bacon, sausage, bologna, salami, chitterlings, fatback, hot dogs, bratwurst, and packaged luncheon meats. Salted nuts and seeds. Canned beans with salt. Dairy Whole or 2% milk, cream, half-and-half, and cream cheese. Whole-fat or sweetened yogurt. Full-fat cheeses or blue cheese. Nondairy creamers and whipped  toppings. Processed cheese, cheese spreads, or cheese curds. Condiments Onion and garlic salt, seasoned salt, table salt, and sea salt. Canned and packaged gravies. Worcestershire sauce. Tartar sauce. Barbecue sauce. Teriyaki sauce. Soy sauce, including reduced sodium. Steak sauce. Fish sauce. Oyster sauce. Cocktail sauce. Horseradish. Ketchup and mustard. Meat flavorings and tenderizers. Bouillon cubes. Hot sauce. Tabasco sauce. Marinades. Taco seasonings. Relishes. Fats and Oils Butter, stick margarine, lard, shortening, ghee, and bacon fat. Coconut, palm kernel, or palm oils. Regular salad dressings. Other Pickles and olives. Salted popcorn and pretzels. The items listed above may not be a complete list of foods and beverages to avoid. Contact your dietitian for more information. WHERE CAN I FIND MORE INFORMATION? National Heart, Lung, and Blood Institute: www.nhlbi.nih.gov/health/health-topics/topics/dash/ Document Released: 06/06/2011 Document Revised: 11/01/2013 Document Reviewed: 04/21/2013 ExitCare Patient Information 2015 [employer]. This information is not intended to replace advice given to you by your health care provider. Make sure you discuss any questions you have with your health care provider. Hypertension Hypertension, commonly called high blood pressure, is when the force of blood pumping through your arteries is too strong. Your arteries are the blood vessels that carry blood from your heart throughout your body. A blood pressure reading consists of a higher number over a lower number, such as 110/72. The higher number (systolic) is the pressure inside your arteries when your heart pumps. The lower number (diastolic) is the pressure inside your arteries when your heart relaxes. Ideally you want your blood pressure below 120/80. Hypertension forces your heart to work harder to pump blood. Your arteries may become narrow or stiff. Having hypertension puts you at risk for heart  disease, stroke, and other problems.  RISK FACTORS Some risk factors for high blood pressure are controllable. Others are not.  Risk factors you cannot control include:   Race. You may be at higher risk if you are African American.  Age. Risk increases with age.  Gender. Men are at higher risk than women before age 45 years. After age 65, women are at higher risk than men. Risk factors you can control include:  Not getting enough exercise or physical activity.  Being overweight.  Getting too much fat, sugar, calories, or salt in your diet.  Drinking too much alcohol. SIGNS AND SYMPTOMS Hypertension does not usually cause signs or symptoms. Extremely high blood pressure (hypertensive crisis) may cause headache, anxiety, shortness of breath, and nosebleed. DIAGNOSIS  To check if you have hypertension, your health care provider will measure your blood pressure while you are seated, with your arm held at the level of your heart. It should be measured at least twice using the same arm. Certain conditions can cause a difference in blood pressure between your right and left arms. A blood pressure reading that is higher than normal on one occasion does not mean that you need treatment. If one blood   pressure reading is high, ask your health care provider about having it checked again. TREATMENT  Treating high blood pressure includes making lifestyle changes and possibly taking medicine. Living a healthy lifestyle can help lower high blood pressure. You may need to change some of your habits. Lifestyle changes may include:  Following the DASH diet. This diet is high in fruits, vegetables, and whole grains. It is low in salt, red meat, and added sugars.  Getting at least 2 hours of brisk physical activity every week.  Losing weight if necessary.  Not smoking.  Limiting alcoholic beverages.  Learning ways to reduce stress. If lifestyle changes are not enough to get your blood pressure  under control, your health care provider may prescribe medicine. You may need to take more than one. Work closely with your health care provider to understand the risks and benefits. HOME CARE INSTRUCTIONS  Have your blood pressure rechecked as directed by your health care provider.   Take medicines only as directed by your health care provider. Follow the directions carefully. Blood pressure medicines must be taken as prescribed. The medicine does not work as well when you skip doses. Skipping doses also puts you at risk for problems.   Do not smoke.   Monitor your blood pressure at home as directed by your health care provider. SEEK MEDICAL CARE IF:   You think you are having a reaction to medicines taken.  You have recurrent headaches or feel dizzy.  You have swelling in your ankles.  You have trouble with your vision. SEEK IMMEDIATE MEDICAL CARE IF:  You develop a severe headache or confusion.  You have unusual weakness, numbness, or feel faint.  You have severe chest or abdominal pain.  You vomit repeatedly.  You have trouble breathing. MAKE SURE YOU:   Understand these instructions.  Will watch your condition.  Will get help right away if you are not doing well or get worse. Document Released: 06/17/2005 Document Revised: 11/01/2013 Document Reviewed: 04/09/2013 ExitCare Patient Information 2015 [employer]. This information is not intended to replace advice given to you by your health care provider. Make sure you discuss any questions you have with your health care provider.  

## 2015-03-21 ENCOUNTER — Encounter: Payer: Self-pay | Admitting: Pharmacist

## 2015-03-21 ENCOUNTER — Ambulatory Visit: Payer: Medicare Other | Attending: Internal Medicine | Admitting: Pharmacist

## 2015-03-21 VITALS — BP 122/82 | HR 70

## 2015-03-21 DIAGNOSIS — Z720 Tobacco use: Secondary | ICD-10-CM

## 2015-03-21 DIAGNOSIS — F17210 Nicotine dependence, cigarettes, uncomplicated: Secondary | ICD-10-CM | POA: Insufficient documentation

## 2015-03-21 DIAGNOSIS — F17200 Nicotine dependence, unspecified, uncomplicated: Secondary | ICD-10-CM

## 2015-03-21 DIAGNOSIS — Z716 Tobacco abuse counseling: Secondary | ICD-10-CM | POA: Diagnosis not present

## 2015-03-21 NOTE — Patient Instructions (Signed)
Let's cut back by two cigarettes each week. As you cut down, we will establish a firm quit date.  Replace those cigarettes with peppermints or cinnamon candies. If you find that those don't work, we will try to nicotine lozenges  The most important thing you can do for your health is to quit smoking - I am here to help! I will call you in two weeks to see how things are going!   Smoking Cessation, Tips for Success If you are ready to quit smoking, congratulations! You have chosen to help yourself be healthier. Cigarettes bring nicotine, tar, carbon monoxide, and other irritants into your body. Your lungs, heart, and blood vessels will be able to work better without these poisons. There are many different ways to quit smoking. Nicotine gum, nicotine patches, a nicotine inhaler, or nicotine nasal spray can help with physical craving. Hypnosis, support groups, and medicines help break the habit of smoking. WHAT THINGS CAN I DO TO MAKE QUITTING EASIER?  Here are some tips to help you quit for good:  Pick a date when you will quit smoking completely. Tell all of your friends and family about your plan to quit on that date.  Throw away all cigarettes.   Clean and remove all ashtrays from your home, work, and car.  On a card, write down your reasons for quitting. Carry the card with you and read it when you get the urge to smoke.  Cleanse your body of nicotine. Drink enough water and fluids to keep your urine clear or pale yellow. Do this after quitting to flush the nicotine from your body.  Learn to predict your moods. Do not let a bad situation be your excuse to have a cigarette. Some situations in your life might tempt you into wanting a cigarette.  Never have "just one" cigarette. It leads to wanting another and another. Remind yourself of your decision to quit.  Change habits associated with smoking. If you smoked while driving or when feeling stressed, try other activities to replace  smoking. Stand up when drinking your coffee. Brush your teeth after eating. Sit in a different chair when you read the paper. Avoid alcohol while trying to quit, and try to drink fewer caffeinated beverages. Alcohol and caffeine may urge you to smoke.  Avoid foods and drinks that can trigger a desire to smoke, such as sugary or spicy foods and alcohol.  Ask people who smoke not to smoke around you.  Have something planned to do right after eating or having a cup of coffee. For example, plan to take a walk or exercise.  Try a relaxation exercise to calm you down and decrease your stress. Remember, you may be tense and nervous for the first 2 weeks after you quit, but this will pass.  Find new activities to keep your hands busy. Play with a pen, coin, or rubber band. Doodle or draw things on paper.  Brush your teeth right after eating. This will help cut down on the craving for the taste of tobacco after meals. You can also try mouthwash.   Use oral substitutes in place of cigarettes. Try using lemon drops, carrots, cinnamon sticks, or chewing gum. Keep them handy so they are available when you have the urge to smoke.  When you have the urge to smoke, try deep breathing.  Designate your home as a nonsmoking area.  If you are a heavy smoker, ask your health care provider about a prescription for nicotine chewing gum.  It can ease your withdrawal from nicotine.  Reward yourself. Set aside the cigarette money you save and buy yourself something nice.  Look for support from others. Join a support group or smoking cessation program. Ask someone at home or at work to help you with your plan to quit smoking.  Always ask yourself, "Do I need this cigarette or is this just a reflex?" Tell yourself, "Today, I choose not to smoke," or "I do not want to smoke." You are reminding yourself of your decision to quit.  Do not replace cigarette smoking with electronic cigarettes (commonly called  e-cigarettes). The safety of e-cigarettes is unknown, and some may contain harmful chemicals.  If you relapse, do not give up! Plan ahead and think about what you will do the next time you get the urge to smoke. HOW WILL I FEEL WHEN I QUIT SMOKING? You may have symptoms of withdrawal because your body is used to nicotine (the addictive substance in cigarettes). You may crave cigarettes, be irritable, feel very hungry, cough often, get headaches, or have difficulty concentrating. The withdrawal symptoms are only temporary. They are strongest when you first quit but will go away within 10-14 days. When withdrawal symptoms occur, stay in control. Think about your reasons for quitting. Remind yourself that these are signs that your body is healing and getting used to being without cigarettes. Remember that withdrawal symptoms are easier to treat than the major diseases that smoking can cause.  Even after the withdrawal is over, expect periodic urges to smoke. However, these cravings are generally short lived and will go away whether you smoke or not. Do not smoke! WHAT RESOURCES ARE AVAILABLE TO HELP ME QUIT SMOKING? Your health care provider can direct you to community resources or hospitals for support, which may include:  Group support.  Education.  Hypnosis.  Therapy. Document Released: 03/15/2004 Document Revised: 11/01/2013 Document Reviewed: 12/03/2012 [facility] Patient Information 2015 [place], [place]. This information is not intended to replace advice given to you by your health care provider. Make sure you discuss any questions you have with your health care provider. Smoking Cessation Quitting smoking is important to your health and has many advantages. However, it is not always easy to quit since nicotine is a very addictive drug. Oftentimes, people try 3 times or more before being able to quit. This document explains the best ways for you to prepare to quit smoking. Quitting takes hard  work and a lot of effort, but you can do it. ADVANTAGES OF QUITTING SMOKING  You will live longer, feel better, and live better.  Your body will feel the impact of quitting smoking almost immediately.  Within 20 minutes, blood pressure decreases. Your pulse returns to its normal level.  After 8 hours, carbon monoxide levels in the blood return to normal. Your oxygen level increases.  After 24 hours, the chance of having a heart attack starts to decrease. Your breath, hair, and body stop smelling like smoke.  After 48 hours, damaged nerve endings begin to recover. Your sense of taste and smell improve.  After 72 hours, the body is virtually free of nicotine. Your bronchial tubes relax and breathing becomes easier.  After 2 to 12 weeks, lungs can hold more air. Exercise becomes easier and circulation improves.  The risk of having a heart attack, stroke, cancer, or lung disease is greatly reduced.  After 1 year, the risk of coronary heart disease is cut in half.  After 5 years, the risk of  stroke falls to the same as a nonsmoker.  After 10 years, the risk of lung cancer is cut in half and the risk of other cancers decreases significantly.  After 15 years, the risk of coronary heart disease drops, usually to the level of a nonsmoker.  If you are pregnant, quitting smoking will improve your chances of having a healthy baby.  The people you live with, especially any children, will be healthier.  You will have extra money to spend on things other than cigarettes. QUESTIONS TO THINK ABOUT BEFORE ATTEMPTING TO QUIT You may want to talk about your answers with your health care provider.  Why do you want to quit?  If you tried to quit in the past, what helped and what did not?  What will be the most difficult situations for you after you quit? How will you plan to handle them?  Who can help you through the tough times? Your family? Friends? A health care provider?  What pleasures do  you get from smoking? What ways can you still get pleasure if you quit? Here are some questions to ask your health care provider:  How can you help me to be successful at quitting?  What medicine do you think would be best for me and how should I take it?  What should I do if I need more help?  What is smoking withdrawal like? How can I get information on withdrawal? GET READY  Set a quit date.  Change your environment by getting rid of all cigarettes, ashtrays, matches, and lighters in your home, car, or work. Do not let people smoke in your home.  Review your past attempts to quit. Think about what worked and what did not. GET SUPPORT AND ENCOURAGEMENT You have a better chance of being successful if you have help. You can get support in many ways.  Tell your family, friends, and coworkers that you are going to quit and need their support. Ask them not to smoke around you.  Get individual, group, or telephone counseling and support. Programs are available at [employer] and health centers. Call your local health department for information about programs in your area.  Spiritual beliefs and practices may help some smokers quit.  Download a "quit meter" on your computer to keep track of quit statistics, such as how long you have gone without smoking, cigarettes not smoked, and money saved.  Get a self-help book about quitting smoking and staying off tobacco. [place] yourself from urges to smoke. Talk to someone, go for a walk, or occupy your time with a task.  Change your normal routine. Take a different route to work. Drink tea instead of coffee. Eat breakfast in a different place.  Reduce your stress. Take a hot bath, exercise, or read a book.  Plan something enjoyable to do every day. Reward yourself for not smoking.  Explore interactive web-based programs that specialize in helping you quit. GET MEDICINE AND USE IT CORRECTLY Medicines  can help you stop smoking and decrease the urge to smoke. Combining medicine with the above behavioral methods and support can greatly increase your chances of successfully quitting smoking.  Nicotine replacement therapy helps deliver nicotine to your body without the negative effects and risks of smoking. Nicotine replacement therapy includes nicotine gum, lozenges, inhalers, nasal sprays, and skin patches. Some may be available over-the-counter and others require a prescription.  Antidepressant medicine helps people abstain from smoking, but how this works  is unknown. This medicine is available by prescription.  Nicotinic receptor partial agonist medicine simulates the effect of nicotine in your brain. This medicine is available by prescription. Ask your health care provider for advice about which medicines to use and how to use them based on your health history. Your health care provider will tell you what side effects to look out for if you choose to be on a medicine or therapy. Carefully read the information on the package. Do not use any other product containing nicotine while using a nicotine replacement product.  RELAPSE OR DIFFICULT SITUATIONS Most relapses occur within the first 3 months after quitting. Do not be discouraged if you start smoking again. Remember, most people try several times before finally quitting. You may have symptoms of withdrawal because your body is used to nicotine. You may crave cigarettes, be irritable, feel very hungry, cough often, get headaches, or have difficulty concentrating. The withdrawal symptoms are only temporary. They are strongest when you first quit, but they will go away within 10-14 days. To reduce the chances of relapse, try to:  Avoid drinking alcohol. Drinking lowers your chances of successfully quitting.  Reduce the amount of caffeine you consume. Once you quit smoking, the amount of caffeine in your body increases and can give you symptoms, such  as a rapid heartbeat, sweating, and anxiety.  Avoid smokers because they can make you want to smoke.  Do not let weight gain distract you. Many smokers will gain weight when they quit, usually less than 10 pounds. Eat a healthy diet and stay active. You can always lose the weight gained after you quit.  Find ways to improve your mood other than smoking. FOR MORE INFORMATION  www.smokefree.gov  Document Released: 06/11/2001 Document Revised: 11/01/2013 Document Reviewed: 09/26/2011 [facility] Patient Information 2015 [place], [place]. This information is not intended to replace advice given to you by your health care provider. Make sure you discuss any questions you have with your health care provider.

## 2015-03-21 NOTE — Progress Notes (Signed)
S:  Patient arrives in good spirits. Patient arrives for evaluation/assistance with tobacco dependence.   Age when started using tobacco on a daily basis: 64 yo. Number of Cigarettes per day 10.   Smokes first cigarette within 30 minutes after waking. Denies waking to smoke Most recent quit attempt: a few years ago Longest time ever been tobacco free: a few weeks. What Medications (NRT, bupropion, varenicline) used in past includes nicotine patches Rates IMPORTANCE of quitting tobacco on 1-10 scale of 10. Rates READINESS of quitting tobacco on 1-10 scale of 10. Rates CONFIDENCE of quitting tobacco on 1-10 scale of 10. Triggers to use tobacco include: routine (eating and going to the bathroom) and stress (particularly anger) He is motivated to quit for his health   Patient reports that he likes cinnamon candies and peppermints and is able to get them from the [employer].   Patient has cut down from 20 cigarettes per day to 10 cigarettes per day over the last 3-4 months. He did not use anything to help him cut back.    A/P: Moderate Nicotine Dependence of 45 years duration in a patient who is good candidate for success b/c of motivation and ability to cut down without nicotine replacement.     Will hold off on initiating nicotine replacement therapy at this time. Patient to cut down by 2 cigarettes each week and to replace cigarettes with peppermints or cinnamon candies when he has a craving. If these do not work, or if he is unable to completely quit without nicotine replacement, will plan on nicotine lozenges. Patches did not work in the past and patient has dentures so he cannot use the gum. Do not think varenicline or bupropion is appropriate at this time. Patient has [facility] and sometimes those patients have OTC benefits which can be used toward nicotine replacement product costs.    Written information provided. Provided information on 1 800-QUIT NOW support  program.  F/U phone call in two weeks.  F/U Rx Clinic Visit in one month.  Total time in face-to-face counseling 30 minutes.

## 2015-04-04 ENCOUNTER — Telehealth: Payer: Self-pay | Admitting: Pharmacist

## 2015-04-04 NOTE — Telephone Encounter (Signed)
Called patient to follow up on tobacco cessation progress.  Patient reports that he thinks he is doing well. He has cut down to less than a pack (was smoking 1/2 pack) but he is not sure if it has been by 2 or more cigarettes.  He reports that he purchased the candies to help him when he has cravings.  He is also thinking about pulling out the amount of cigarettes that he can smoke for the day and then only smoke that amount rather than having access to the entire pack.  Congratulated patient on progress. Patient to follow up in clinic with me 04/13/15.   Nicoletta Ba, PharmD, BCPS, [place] [facility] [phone]

## 2015-04-13 ENCOUNTER — Ambulatory Visit: Payer: 59 | Attending: Internal Medicine | Admitting: Pharmacist

## 2015-04-13 DIAGNOSIS — F17210 Nicotine dependence, cigarettes, uncomplicated: Secondary | ICD-10-CM | POA: Diagnosis not present

## 2015-04-13 DIAGNOSIS — F17200 Nicotine dependence, unspecified, uncomplicated: Secondary | ICD-10-CM

## 2015-04-13 NOTE — Patient Instructions (Signed)
You are doing AWESOME Steven Delacruz!!  Keep up the great work!  Try the lozenges - a small pack will be about $12 at [facility] and a larger pack will be about $30  I will call you in two weeks to check on you - we can decide at that time if you need to come in and see me

## 2015-04-13 NOTE — Progress Notes (Signed)
S:  Patient arrives in good spirits. Patient arrives for evaluation/assistance with tobacco dependence.   He reports smoking about 5-6 cigarettes a day (down from 10 cigarettes one month ago). 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 Also, when he leaves the home, he leaves his cigarettes at home.   He reports that he notices the smell of cigarettes now and that he doesn't like it. This has also helped him to cut back.  The biggest help with him cutting back has been licorice twigs. He reports chewing on them throughout the day and one stick lasts him about 2 days. He has tried not to use the candies as much due to sugar intake.   Patient is interested in nicotine lozenges to help him completely quit.    A/P: Moderate Nicotine Dependence of 45 years duration in a patient who is good candidate for success b/c of motivation and ability to cut down without nicotine replacement.     Patient will look into getting nicotine lozenges at the store to replace the 5-6 cigarettes that he smokes throughout the day. Provided information on nicotine lozenges and instructed patient to avoid the patches at this time due to lower nicotine intake. His goal is to cut down to 3 cigarettes in the next 2 weeks and then be completely nicotine free by the time of this next visit with Dr. Doreene Burke. This is the first mention of a quit date by the patient - congratulated patient on progress. He is doing a great job.   Written information provided. F/U phone call in two weeks.  F/U Rx Clinic Visit as needed.  Total time in face-to-face counseling 20 minutes.

## 2015-04-24 DIAGNOSIS — Z6828 Body mass index (BMI) 28.0-28.9, adult: Secondary | ICD-10-CM | POA: Diagnosis not present

## 2015-04-24 DIAGNOSIS — M545 Low back pain: Secondary | ICD-10-CM | POA: Diagnosis not present

## 2015-04-24 DIAGNOSIS — M47817 Spondylosis without myelopathy or radiculopathy, lumbosacral region: Secondary | ICD-10-CM | POA: Diagnosis not present

## 2015-04-24 DIAGNOSIS — I10 Essential (primary) hypertension: Secondary | ICD-10-CM | POA: Diagnosis not present

## 2015-04-27 ENCOUNTER — Telehealth: Payer: Self-pay | Admitting: Pharmacist

## 2015-04-27 NOTE — Telephone Encounter (Signed)
I called the patient to discuss tobacco cessation progress and I was unable to reach him or leave a message.  I will reach out again next week.  Nicoletta Ba, PharmD, BCPS, [place] [facility] [phone]

## 2015-05-03 ENCOUNTER — Telehealth: Payer: Self-pay | Admitting: Pharmacist

## 2015-05-03 NOTE — Telephone Encounter (Signed)
I called patient today to follow up on smoking cessation efforts. Patient reports that he is still smoking <7 cigarettes per day.   He found out that he had an OTC benefit with his insurance and so he used it to get free nicotine lozenges. They just arrived in the mail yesterday so he is going to use them tomorrow instead of cigarettes and see how it goes. He understands how to use them and denies any questions or concerns about the product.  I told patient that I would call him next week to see how the lozenges worked. Patient verbalized understanding.   Nicoletta Ba, PharmD, BCPS, [facility] [phone]

## 2015-05-10 ENCOUNTER — Telehealth: Payer: Self-pay | Admitting: Pharmacist

## 2015-05-10 NOTE — Telephone Encounter (Signed)
I called the patient to discuss tobacco cessation progress and I was unable to reach him or leave a message.  I will reach out again next week.  Nicoletta Ba, PharmD, BCPS, [facility] [phone]

## 2015-05-11 ENCOUNTER — Telehealth: Payer: Self-pay | Admitting: Pharmacist

## 2015-05-11 NOTE — Telephone Encounter (Signed)
I called patient today to follow up on smoking cessation efforts. Patient reports that he is smoking 2-3 cigarettes per day after starting to use the nicotine lozenges.   He is concerned that he may smoke more during Thanksgiving because of all of the food that he will eat. However he still feels confident that he can be tobacco free by his next appointment with Dr. Doreene Burke. He feels that the licorice sticks still help him the most but that the lozenges cut the cravings when the licorice isn't enough. He thinks that it has really helped him to slowly cut down in his tobacco use.  I told patient that I would call him in 3 weeks to check on his progress. Also congratulated patient on current progress.  Nicoletta Ba, PharmD, BCPS, [facility] [phone]

## 2015-06-01 ENCOUNTER — Telehealth: Payer: Self-pay | Admitting: Pharmacist

## 2015-06-01 NOTE — Telephone Encounter (Signed)
error 

## 2015-06-01 NOTE — Telephone Encounter (Signed)
I called patient today to follow up on smoking cessation efforts. Patient reports that he is smoking about 7 cigarettes per day due to the holidays and eating a lot.  He is going to work to cut back down to 2 cigarettes per day by the time he sees Dr. Doreene Burke, which is planned for the end of the month, or early next month.   Encouraged patient to stay motivated and to let me know if there is anything that I can do to help him in the next few weeks before he sees Dr. Doreene Burke. Patient verbalized understanding.  I will follow up with patient in 1 month.  Nicoletta Ba, PharmD, BCPS, [facility] [phone]

## 2015-06-20 NOTE — Telephone Encounter (Signed)
Patient seen in clinic and needs addressed.

## 2015-06-30 ENCOUNTER — Telehealth: Payer: Self-pay | Admitting: Pharmacist

## 2015-06-30 NOTE — Telephone Encounter (Signed)
I called patient today to follow up on smoking cessation efforts. Patient reports that he is still smoking about 7 cigarettes per day due to the holidays.  Patient has set a quit date of 07/02/2015. He has multiple friends who are going to quit with him. He is ready to make this change and thinks that doing it with friends will help. Congratulated patient on making this decision and setting a quit date.   I will follow up with patient in 1 week to see if there is any further support that I can provide.  Nicoletta Ba, PharmD, BCPS, [facility] [phone]

## 2015-07-17 ENCOUNTER — Encounter: Payer: Self-pay | Admitting: Internal Medicine

## 2015-07-17 ENCOUNTER — Ambulatory Visit: Payer: 59 | Attending: Internal Medicine | Admitting: Internal Medicine

## 2015-07-17 VITALS — BP 132/65 | HR 57 | Temp 98.1°F | Resp 18 | Ht 72.0 in | Wt 207.0 lb

## 2015-07-17 DIAGNOSIS — G894 Chronic pain syndrome: Secondary | ICD-10-CM | POA: Insufficient documentation

## 2015-07-17 DIAGNOSIS — I10 Essential (primary) hypertension: Secondary | ICD-10-CM | POA: Insufficient documentation

## 2015-07-17 DIAGNOSIS — M25511 Pain in right shoulder: Secondary | ICD-10-CM | POA: Insufficient documentation

## 2015-07-17 DIAGNOSIS — R7303 Prediabetes: Secondary | ICD-10-CM | POA: Diagnosis not present

## 2015-07-17 DIAGNOSIS — R7309 Other abnormal glucose: Secondary | ICD-10-CM | POA: Diagnosis not present

## 2015-07-17 DIAGNOSIS — G8929 Other chronic pain: Secondary | ICD-10-CM | POA: Diagnosis not present

## 2015-07-17 DIAGNOSIS — Z1211 Encounter for screening for malignant neoplasm of colon: Secondary | ICD-10-CM

## 2015-07-17 DIAGNOSIS — Z888 Allergy status to other drugs, medicaments and biological substances status: Secondary | ICD-10-CM | POA: Diagnosis not present

## 2015-07-17 DIAGNOSIS — I252 Old myocardial infarction: Secondary | ICD-10-CM | POA: Insufficient documentation

## 2015-07-17 DIAGNOSIS — E785 Hyperlipidemia, unspecified: Secondary | ICD-10-CM | POA: Insufficient documentation

## 2015-07-17 DIAGNOSIS — Z79899 Other long term (current) drug therapy: Secondary | ICD-10-CM | POA: Diagnosis not present

## 2015-07-17 DIAGNOSIS — Z87891 Personal history of nicotine dependence: Secondary | ICD-10-CM | POA: Diagnosis not present

## 2015-07-17 LAB — GLUCOSE, POCT (MANUAL RESULT ENTRY): POC Glucose: 110 mg/dl — AB (ref 70–99)

## 2015-07-17 LAB — POCT GLYCOSYLATED HEMOGLOBIN (HGB A1C): Hemoglobin A1C: 6

## 2015-07-17 MED ORDER — ACETAMINOPHEN-CODEINE #3 300-30 MG PO TABS: 1.0000 | ORAL_TABLET | Freq: Three times a day (TID) | ORAL | Status: DC | PRN

## 2015-07-17 MED ORDER — POTASSIUM CHLORIDE CRYS ER 20 MEQ PO TBCR: 20.0000 meq | EXTENDED_RELEASE_TABLET | Freq: Every day | ORAL | Status: DC

## 2015-07-17 MED FILL — ACETAMINOPHEN/COD #3 TABLET: 300-30 | 30 days supply | Qty: 90 | Fill #0

## 2015-07-17 NOTE — Progress Notes (Signed)
Patient here for 3 month FU for DM  Patient denies pain at this time.  Patient would like refill on Potassium. Last check was 7 months ago=3.2 (L)

## 2015-07-17 NOTE — Progress Notes (Signed)
Patient ID: Steven Delacruz, male   DOB: 1951/05/07, 65 y.o.   MRN: [id number]   Steven Delacruz, is a 65 y.o. male  [id number]  [id number]  DOB - [birth year]  Chief Complaint  Patient presents with  . Follow-up        Subjective:   Steven Delacruz is a 65 y.o. male with history of hypertension, dyslipidemia, remote MI, chronic pain syndrome on nicotine addiction here today for a follow up visit. Patient has no new complaint today. Patient is working very hard to quit smoking, now down to 2 cigarettes per day, using was take instead of cigarette. He has a final date of 08/26/2015, he did before his birthday. Patient has not been able to do his colonoscopy due to transportation difficulties. His last colonoscopy was 2006 which showed diverticulosis, hemorrhoids, and polyps. He was required to do another one in 5 years from 2006 patient has not had one done. Patient continues to smoke about 2 cigarettes per day but on his way to quitting. He is compliant with his medications. He reports no side effects. Patient has No headache, No chest pain, No abdominal pain - No Nausea, No new weakness tingling or numbness, No Cough - SOB.  Problem  Pain in Joint, Shoulder Region, Right    ALLERGIES: Allergies  Allergen Reactions  . Lisinopril Swelling    angioedema    PAST MEDICAL HISTORY: Past Medical History  Diagnosis Date  . Hypertension   . Chronic back pain   . Right shoulder pain   . Myocardial infarction ([place])   . Headache(784.0)   . Abnormal CT scan, pancreas - ? focal pancreatitis  05/13/2013  . Abdominal aortic atherosclerosis ([place]) 05/13/2013    MEDICATIONS AT HOME: Prior to Admission medications   Medication Sig Start Date End Date Taking? Authorizing Provider  acetaminophen-codeine (TYLENOL #3) 300-30 MG tablet Take 1 tablet by mouth every 8 (eight) hours as needed. 07/17/15  Yes Tresa Garter, MD  amLODipine (NORVASC) 10 MG tablet Take 1 tablet (10 mg total)  by mouth daily. 03/16/15  Yes Tresa Garter, MD  atenolol (TENORMIN) 100 MG tablet Take 1 tablet (100 mg total) by mouth daily. 03/16/15  Yes Tresa Garter, MD  hydrochlorothiazide (HYDRODIURIL) 25 MG tablet Take 1 tablet (25 mg total) by mouth daily. 03/16/15  Yes Tresa Garter, MD  potassium chloride SA (K-DUR,KLOR-CON) 20 MEQ tablet Take 1 tablet (20 mEq total) by mouth daily. 07/17/15  Yes Tresa Garter, MD     Objective:   Filed Vitals:   07/17/15 0916  BP: 132/65  Pulse: 57  Temp: 98.1 F (36.7 C)  TempSrc: Oral  Resp: 18  Height: 6' (1.829 m)  Weight: 207 lb (93.895 kg)  SpO2: 100%    Exam General appearance : Awake, alert, not in any distress. Speech Clear. Not toxic looking HEENT: Atraumatic and Normocephalic, pupils equally reactive to light and accomodation Neck: supple, no JVD. No cervical lymphadenopathy.  Chest:Good air entry bilaterally, no added sounds  CVS: S1 S2 regular, no murmurs.  Abdomen: Bowel sounds present, Non tender and not distended with no gaurding, rigidity or rebound. Extremities: B/L Lower Ext shows no edema, both legs are warm to touch Neurology: Awake alert, and oriented X 3, CN II-XII intact, Non focal Skin:No Rash  Data Review Lab Results  Component Value Date   HGBA1C 6.0 07/17/2015   HGBA1C 5.80 12/12/2014   HGBA1C 5.4 12/21/2013     Assessment & Plan  1. Prediabetes  - POCT A1C - Glucose (CBG) - Microalbumin/Creatinine Ratio, Urine  Aim for 30 minutes of exercise most days. Rethink what you drink. Water is great! Aim for 2-3 Carb Choices per meal (30-45 grams) +/- 1 either way  Aim for 0-15 Carbs per snack if hungry  Include protein in moderation with your meals and snacks  Consider reading food labels for Total Carbohydrate and Fat Grams of foods  Consider checking BG at alternate times per day  Continue taking medication as directed Be mindful about how much sugar you are adding to beverages and  other foods. Fruit Punch - find one with no sugar  Measure and decrease portions of carbohydrate foods  Make your plate and don't go back for seconds   2. Essential hypertension  - potassium chloride SA (K-DUR,KLOR-CON) 20 MEQ tablet; Take 1 tablet (20 mEq total) by mouth daily.  Dispense: 30 tablet; Refill: 3 We have discussed target BP range and blood pressure goal. I have advised patient to check BP regularly and to call us back or report to clinic if the numbers are consistently higher than 140/90. We discussed the importance of compliance with medical therapy and DASH diet recommended, consequences of uncontrolled hypertension discussed.   - continue current BP medications  3. Colon cancer screening  - Ambulatory referral to Gastroenterology  4. Pain in joint, shoulder region, right  - acetaminophen-codeine (TYLENOL #3) 300-30 MG tablet; Take 1 tablet by mouth every 8 (eight) hours as needed.  Dispense: 90 tablet; Refill: 0  Patient have been counseled extensively about nutrition and exercise  Return in about 3 months (around 10/15/2015) for Follow up HTN, Hemoglobin A1C and Follow up, DM.  The patient was given clear instructions to go to ER or return to medical center if symptoms don't improve, worsen or new problems develop. The patient verbalized understanding. The patient was told to call to get lab results if they haven't heard anything in the next week.   This note has been created with Surveyor, quantity. Any transcriptional errors are unintentional.    Angelica Chessman, MD, [place], Karilyn Cota, [place] and [facility] [place], [place]   07/17/2015, 9:41 AM

## 2015-07-17 NOTE — Patient Instructions (Addendum)
DASH Eating Plan DASH stands for "Dietary Approaches to Stop Hypertension." The DASH eating plan is a healthy eating plan that has been shown to reduce high blood pressure (hypertension). Additional health benefits may include reducing the risk of type 2 diabetes mellitus, heart disease, and stroke. The DASH eating plan may also help with weight loss. WHAT DO I NEED TO KNOW ABOUT THE DASH EATING PLAN? For the DASH eating plan, you will follow these general guidelines:  Choose foods with a percent daily value for sodium of less than 5% (as listed on the food label).  Use salt-free seasonings or herbs instead of table salt or sea salt.  Check with your health care provider or pharmacist before using salt substitutes.  Eat lower-sodium products, often labeled as "lower sodium" or "no salt added."  Eat fresh foods.  Eat more vegetables, fruits, and low-fat dairy products.  Choose whole grains. Look for the word "whole" as the first word in the ingredient list.  Choose fish and skinless chicken or turkey more often than red meat. Limit fish, poultry, and meat to 6 oz (170 g) each day.  Limit sweets, desserts, sugars, and sugary drinks.  Choose heart-healthy fats.  Limit cheese to 1 oz (28 g) per day.  Eat more home-cooked food and less restaurant, buffet, and fast food.  Limit fried foods.  Cook foods using methods other than frying.  Limit canned vegetables. If you do use them, rinse them well to decrease the sodium.  When eating at a restaurant, ask that your food be prepared with less salt, or no salt if possible. WHAT FOODS CAN I EAT? Seek help from a dietitian for individual calorie needs. Grains Whole grain or whole wheat bread. Brown rice. Whole grain or whole wheat pasta. Quinoa, bulgur, and whole grain cereals. Low-sodium cereals. Corn or whole wheat flour tortillas. Whole grain cornbread. Whole grain crackers. Low-sodium crackers. Vegetables Fresh or frozen vegetables  (raw, steamed, roasted, or grilled). Low-sodium or reduced-sodium tomato and vegetable juices. Low-sodium or reduced-sodium tomato sauce and paste. Low-sodium or reduced-sodium canned vegetables.  Fruits All fresh, canned (in natural juice), or frozen fruits. Meat and Other Protein Products Ground beef (85% or leaner), grass-fed beef, or beef trimmed of fat. Skinless chicken or turkey. Ground chicken or turkey. Pork trimmed of fat. All fish and seafood. Eggs. Dried beans, peas, or lentils. Unsalted nuts and seeds. Unsalted canned beans. Dairy Low-fat dairy products, such as skim or 1% milk, 2% or reduced-fat cheeses, low-fat ricotta or cottage cheese, or plain low-fat yogurt. Low-sodium or reduced-sodium cheeses. Fats and Oils Tub margarines without trans fats. Light or reduced-fat mayonnaise and salad dressings (reduced sodium). Avocado. Safflower, olive, or canola oils. Natural peanut or almond butter. Other Unsalted popcorn and pretzels. The items listed above may not be a complete list of recommended foods or beverages. Contact your dietitian for more options. WHAT FOODS ARE NOT RECOMMENDED? Grains White bread. White pasta. White rice. Refined cornbread. Bagels and croissants. Crackers that contain trans fat. Vegetables Creamed or fried vegetables. Vegetables in a cheese sauce. Regular canned vegetables. Regular canned tomato sauce and paste. Regular tomato and vegetable juices. Fruits Dried fruits. Canned fruit in light or heavy syrup. Fruit juice. Meat and Other Protein Products Fatty cuts of meat. Ribs, chicken wings, bacon, sausage, bologna, salami, chitterlings, fatback, hot dogs, bratwurst, and packaged luncheon meats. Salted nuts and seeds. Canned beans with salt. Dairy Whole or 2% milk, cream, half-and-half, and cream cheese. Whole-fat or sweetened yogurt. Full-fat   cheeses or blue cheese. Nondairy creamers and whipped toppings. Processed cheese, cheese spreads, or cheese  curds. Condiments Onion and garlic salt, seasoned salt, table salt, and sea salt. Canned and packaged gravies. Worcestershire sauce. Tartar sauce. Barbecue sauce. Teriyaki sauce. Soy sauce, including reduced sodium. Steak sauce. Fish sauce. Oyster sauce. Cocktail sauce. Horseradish. Ketchup and mustard. Meat flavorings and tenderizers. Bouillon cubes. Hot sauce. Tabasco sauce. Marinades. Taco seasonings. Relishes. Fats and Oils Butter, stick margarine, lard, shortening, ghee, and bacon fat. Coconut, palm kernel, or palm oils. Regular salad dressings. Other Pickles and olives. Salted popcorn and pretzels. The items listed above may not be a complete list of foods and beverages to avoid. Contact your dietitian for more information. WHERE CAN I FIND MORE INFORMATION? National Heart, Lung, and Blood Institute: www.nhlbi.nih.gov/health/health-topics/topics/dash/   This information is not intended to replace advice given to you by your health care provider. Make sure you discuss any questions you have with your health care provider.   Document Released: 06/06/2011 Document Revised: 07/08/2014 Document Reviewed: 04/21/2013 Elsevier Interactive Patient Education 2016 Elsevier Inc. Hypertension Hypertension, commonly called high blood pressure, is when the force of blood pumping through your arteries is too strong. Your arteries are the blood vessels that carry blood from your heart throughout your body. A blood pressure reading consists of a higher number over a lower number, such as 110/72. The higher number (systolic) is the pressure inside your arteries when your heart pumps. The lower number (diastolic) is the pressure inside your arteries when your heart relaxes. Ideally you want your blood pressure below 120/80. Hypertension forces your heart to work harder to pump blood. Your arteries may become narrow or stiff. Having untreated or uncontrolled hypertension can cause heart attack, stroke, kidney  disease, and other problems. RISK FACTORS Some risk factors for high blood pressure are controllable. Others are not.  Risk factors you cannot control include:   Race. You may be at higher risk if you are African American.  Age. Risk increases with age.  Gender. Men are at higher risk than women before age 45 years. After age 65, women are at higher risk than men. Risk factors you can control include:  Not getting enough exercise or physical activity.  Being overweight.  Getting too much fat, sugar, calories, or salt in your diet.  Drinking too much alcohol. SIGNS AND SYMPTOMS Hypertension does not usually cause signs or symptoms. Extremely high blood pressure (hypertensive crisis) may cause headache, anxiety, shortness of breath, and nosebleed. DIAGNOSIS To check if you have hypertension, your health care provider will measure your blood pressure while you are seated, with your arm held at the level of your heart. It should be measured at least twice using the same arm. Certain conditions can cause a difference in blood pressure between your right and left arms. A blood pressure reading that is higher than normal on one occasion does not mean that you need treatment. If it is not clear whether you have high blood pressure, you may be asked to return on a different day to have your blood pressure checked again. Or, you may be asked to monitor your blood pressure at home for 1 or more weeks. TREATMENT Treating high blood pressure includes making lifestyle changes and possibly taking medicine. Living a healthy lifestyle can help lower high blood pressure. You may need to change some of your habits. Lifestyle changes may include:  Following the DASH diet. This diet is high in fruits, vegetables, and whole grains.   It is low in salt, red meat, and added sugars.  Keep your sodium intake below 2,300 mg per day.  Getting at least 30-45 minutes of aerobic exercise at least 4 times per  week.  Losing weight if necessary.  Not smoking.  Limiting alcoholic beverages.  Learning ways to reduce stress. Your health care provider may prescribe medicine if lifestyle changes are not enough to get your blood pressure under control, and if one of the following is true:  You are 26-48 years of age and your systolic blood pressure is above 140.  You are 26 years of age or older, and your systolic blood pressure is above 150.  Your diastolic blood pressure is above 90.  You have diabetes, and your systolic blood pressure is over [id number] or your diastolic blood pressure is over 90.  You have kidney disease and your blood pressure is above 140/90.  You have heart disease and your blood pressure is above 140/90. Your personal target blood pressure may vary depending on your medical conditions, your age, and other factors. HOME CARE INSTRUCTIONS  Have your blood pressure rechecked as directed by your health care provider.   Take medicines only as directed by your health care provider. Follow the directions carefully. Blood pressure medicines must be taken as prescribed. The medicine does not work as well when you skip doses. Skipping doses also puts you at risk for problems.  Do not smoke.   Monitor your blood pressure at home as directed by your health care provider. SEEK MEDICAL CARE IF:   You think you are having a reaction to medicines taken.  You have recurrent headaches or feel dizzy.  You have swelling in your ankles.  You have trouble with your vision. SEEK IMMEDIATE MEDICAL CARE IF:  You develop a severe headache or confusion.  You have unusual weakness, numbness, or feel faint.  You have severe chest or abdominal pain.  You vomit repeatedly.  You have trouble breathing. MAKE SURE YOU:   Understand these instructions.  Will watch your condition.  Will get help right away if you are not doing well or get worse.   This information is not intended to  replace advice given to you by your health care provider. Make sure you discuss any questions you have with your health care provider.   Document Released: 06/17/2005 Document Revised: 11/01/2014 Document Reviewed: 04/09/2013 Elsevier Interactive Patient Education 2016 [employer]. Smoking Cessation, Tips for Success If you are ready to quit smoking, congratulations! You have chosen to help yourself be healthier. Cigarettes bring nicotine, tar, carbon monoxide, and other irritants into your body. Your lungs, heart, and blood vessels will be able to work better without these poisons. There are many different ways to quit smoking. Nicotine gum, nicotine patches, a nicotine inhaler, or nicotine nasal spray can help with physical craving. Hypnosis, support groups, and medicines help break the habit of smoking. WHAT THINGS CAN I DO TO MAKE QUITTING EASIER?  Here are some tips to help you quit for good:  Pick a date when you will quit smoking completely. Tell all of your friends and family about your plan to quit on that date.  Do not try to slowly cut down on the number of cigarettes you are smoking. Pick a quit date and quit smoking completely starting on that day.  Throw away all cigarettes.   Clean and remove all ashtrays from your home, work, and car.  On a card, write down your reasons for  quitting. Carry the card with you and read it when you get the urge to smoke.  Cleanse your body of nicotine. Drink enough water and fluids to keep your urine clear or pale yellow. Do this after quitting to flush the nicotine from your body.  Learn to predict your moods. Do not let a bad situation be your excuse to have a cigarette. Some situations in your life might tempt you into wanting a cigarette.  Never have "just one" cigarette. It leads to wanting another and another. Remind yourself of your decision to quit.  Change habits associated with smoking. If you smoked while driving or when feeling  stressed, try other activities to replace smoking. Stand up when drinking your coffee. Brush your teeth after eating. Sit in a different chair when you read the paper. Avoid alcohol while trying to quit, and try to drink fewer caffeinated beverages. Alcohol and caffeine may urge you to smoke.  Avoid foods and drinks that can trigger a desire to smoke, such as sugary or spicy foods and alcohol.  Ask people who smoke not to smoke around you.  Have something planned to do right after eating or having a cup of coffee. For example, plan to take a walk or exercise.  Try a relaxation exercise to calm you down and decrease your stress. Remember, you may be tense and nervous for the first 2 weeks after you quit, but this will pass.  Find new activities to keep your hands busy. Play with a pen, coin, or rubber band. Doodle or draw things on paper.  Brush your teeth right after eating. This will help cut down on the craving for the taste of tobacco after meals. You can also try mouthwash.   Use oral substitutes in place of cigarettes. Try using lemon drops, carrots, cinnamon sticks, or chewing gum. Keep them handy so they are available when you have the urge to smoke.  When you have the urge to smoke, try deep breathing.  Designate your home as a nonsmoking area.  If you are a heavy smoker, ask your health care provider about a prescription for nicotine chewing gum. It can ease your withdrawal from nicotine.  Reward yourself. Set aside the cigarette money you save and buy yourself something nice.  Look for support from others. Join a support group or smoking cessation program. Ask someone at home or at work to help you with your plan to quit smoking.  Always ask yourself, "Do I need this cigarette or is this just a reflex?" Tell yourself, "Today, I choose not to smoke," or "I do not want to smoke." You are reminding yourself of your decision to quit.  Do not replace cigarette smoking with  electronic cigarettes (commonly called e-cigarettes). The safety of e-cigarettes is unknown, and some may contain harmful chemicals.  If you relapse, do not give up! Plan ahead and think about what you will do the next time you get the urge to smoke. HOW WILL I FEEL WHEN I QUIT SMOKING? You may have symptoms of withdrawal because your body is used to nicotine (the addictive substance in cigarettes). You may crave cigarettes, be irritable, feel very hungry, cough often, get headaches, or have difficulty concentrating. The withdrawal symptoms are only temporary. They are strongest when you first quit but will go away within 10-14 days. When withdrawal symptoms occur, stay in control. Think about your reasons for quitting. Remind yourself that these are signs that your body is healing and getting used  to being without cigarettes. Remember that withdrawal symptoms are easier to treat than the major diseases that smoking can cause.  Even after the withdrawal is over, expect periodic urges to smoke. However, these cravings are generally short lived and will go away whether you smoke or not. Do not smoke! WHAT RESOURCES ARE AVAILABLE TO HELP ME QUIT SMOKING? Your health care provider can direct you to community resources or hospitals for support, which may include:  Group support.  Education.  Hypnosis.  Therapy.   This information is not intended to replace advice given to you by your health care provider. Make sure you discuss any questions you have with your health care provider.   Document Released: 03/15/2004 Document Revised: 07/08/2014 Document Reviewed: 12/03/2012 Elsevier Interactive Patient Education [employer].

## 2015-07-18 LAB — MICROALBUMIN / CREATININE URINE RATIO
Creatinine, Urine: 100 mg/dL (ref 20–370)
Microalb, Ur: <0.2 mg/dL

## 2015-07-24 ENCOUNTER — Telehealth: Payer: Self-pay | Admitting: *Deleted

## 2015-07-24 DIAGNOSIS — M545 Low back pain: Secondary | ICD-10-CM | POA: Diagnosis not present

## 2015-07-24 DIAGNOSIS — M47817 Spondylosis without myelopathy or radiculopathy, lumbosacral region: Secondary | ICD-10-CM | POA: Diagnosis not present

## 2015-07-24 NOTE — Telephone Encounter (Signed)
Patient verified DOB Patient informed of Gastro appointment being 07/31/15 at 9:15am with Dr Benson Norway Patient was provided the location and telephone number. Patient expressed his understanding and had no further questions.

## 2015-07-24 NOTE — Telephone Encounter (Signed)
Medical Assistant left message on patient's home and cell voicemail. Voicemail states to give a call back to [place] with [facility] at [phone].   Please inform patient of Gastro appointment being on 07/31/15 at 9:15am with Dr. Benson Norway.

## 2015-07-31 DIAGNOSIS — I10 Essential (primary) hypertension: Secondary | ICD-10-CM | POA: Diagnosis not present

## 2015-07-31 DIAGNOSIS — K625 Hemorrhage of anus and rectum: Secondary | ICD-10-CM | POA: Diagnosis not present

## 2015-08-08 DIAGNOSIS — Z1211 Encounter for screening for malignant neoplasm of colon: Secondary | ICD-10-CM | POA: Diagnosis not present

## 2015-08-08 DIAGNOSIS — K635 Polyp of colon: Secondary | ICD-10-CM | POA: Diagnosis not present

## 2015-08-08 DIAGNOSIS — K5730 Diverticulosis of large intestine without perforation or abscess without bleeding: Secondary | ICD-10-CM | POA: Diagnosis not present

## 2015-08-08 DIAGNOSIS — D123 Benign neoplasm of transverse colon: Secondary | ICD-10-CM | POA: Diagnosis not present

## 2015-08-08 DIAGNOSIS — D122 Benign neoplasm of ascending colon: Secondary | ICD-10-CM | POA: Diagnosis not present

## 2015-10-10 DIAGNOSIS — M545 Low back pain: Secondary | ICD-10-CM | POA: Diagnosis not present

## 2015-10-10 DIAGNOSIS — Z79899 Other long term (current) drug therapy: Secondary | ICD-10-CM | POA: Diagnosis not present

## 2015-10-10 DIAGNOSIS — M47817 Spondylosis without myelopathy or radiculopathy, lumbosacral region: Secondary | ICD-10-CM | POA: Diagnosis not present

## 2015-10-10 DIAGNOSIS — Z5181 Encounter for therapeutic drug level monitoring: Secondary | ICD-10-CM | POA: Diagnosis not present

## 2015-10-10 DIAGNOSIS — Z79891 Long term (current) use of opiate analgesic: Secondary | ICD-10-CM | POA: Diagnosis not present

## 2015-10-17 DIAGNOSIS — M25562 Pain in left knee: Secondary | ICD-10-CM | POA: Diagnosis not present

## 2015-10-17 DIAGNOSIS — M6281 Muscle weakness (generalized): Secondary | ICD-10-CM | POA: Diagnosis not present

## 2015-10-17 DIAGNOSIS — M25561 Pain in right knee: Secondary | ICD-10-CM | POA: Diagnosis not present

## 2015-10-17 DIAGNOSIS — M25572 Pain in left ankle and joints of left foot: Secondary | ICD-10-CM | POA: Diagnosis not present

## 2015-10-23 DIAGNOSIS — M25561 Pain in right knee: Secondary | ICD-10-CM | POA: Diagnosis not present

## 2015-10-23 DIAGNOSIS — M25572 Pain in left ankle and joints of left foot: Secondary | ICD-10-CM | POA: Diagnosis not present

## 2015-10-23 DIAGNOSIS — M25562 Pain in left knee: Secondary | ICD-10-CM | POA: Diagnosis not present

## 2015-10-23 DIAGNOSIS — M6281 Muscle weakness (generalized): Secondary | ICD-10-CM | POA: Diagnosis not present

## 2015-10-27 DIAGNOSIS — M25572 Pain in left ankle and joints of left foot: Secondary | ICD-10-CM | POA: Diagnosis not present

## 2015-10-27 DIAGNOSIS — M6281 Muscle weakness (generalized): Secondary | ICD-10-CM | POA: Diagnosis not present

## 2015-10-27 DIAGNOSIS — M25562 Pain in left knee: Secondary | ICD-10-CM | POA: Diagnosis not present

## 2015-10-27 DIAGNOSIS — M25561 Pain in right knee: Secondary | ICD-10-CM | POA: Diagnosis not present

## 2015-10-30 DIAGNOSIS — M25561 Pain in right knee: Secondary | ICD-10-CM | POA: Diagnosis not present

## 2015-10-30 DIAGNOSIS — M6281 Muscle weakness (generalized): Secondary | ICD-10-CM | POA: Diagnosis not present

## 2015-10-30 DIAGNOSIS — M25572 Pain in left ankle and joints of left foot: Secondary | ICD-10-CM | POA: Diagnosis not present

## 2015-10-30 DIAGNOSIS — M25562 Pain in left knee: Secondary | ICD-10-CM | POA: Diagnosis not present

## 2015-11-03 DIAGNOSIS — M25572 Pain in left ankle and joints of left foot: Secondary | ICD-10-CM | POA: Diagnosis not present

## 2015-11-03 DIAGNOSIS — M25562 Pain in left knee: Secondary | ICD-10-CM | POA: Diagnosis not present

## 2015-11-03 DIAGNOSIS — M6281 Muscle weakness (generalized): Secondary | ICD-10-CM | POA: Diagnosis not present

## 2015-11-03 DIAGNOSIS — M25561 Pain in right knee: Secondary | ICD-10-CM | POA: Diagnosis not present

## 2015-11-06 DIAGNOSIS — M6281 Muscle weakness (generalized): Secondary | ICD-10-CM | POA: Diagnosis not present

## 2015-11-06 DIAGNOSIS — M25561 Pain in right knee: Secondary | ICD-10-CM | POA: Diagnosis not present

## 2015-11-06 DIAGNOSIS — M25562 Pain in left knee: Secondary | ICD-10-CM | POA: Diagnosis not present

## 2015-11-06 DIAGNOSIS — M25572 Pain in left ankle and joints of left foot: Secondary | ICD-10-CM | POA: Diagnosis not present

## 2015-11-10 DIAGNOSIS — M25561 Pain in right knee: Secondary | ICD-10-CM | POA: Diagnosis not present

## 2015-11-10 DIAGNOSIS — M25562 Pain in left knee: Secondary | ICD-10-CM | POA: Diagnosis not present

## 2015-11-10 DIAGNOSIS — M6281 Muscle weakness (generalized): Secondary | ICD-10-CM | POA: Diagnosis not present

## 2015-11-10 DIAGNOSIS — M25572 Pain in left ankle and joints of left foot: Secondary | ICD-10-CM | POA: Diagnosis not present

## 2015-11-13 DIAGNOSIS — M25572 Pain in left ankle and joints of left foot: Secondary | ICD-10-CM | POA: Diagnosis not present

## 2015-11-13 DIAGNOSIS — M25562 Pain in left knee: Secondary | ICD-10-CM | POA: Diagnosis not present

## 2015-11-13 DIAGNOSIS — M6281 Muscle weakness (generalized): Secondary | ICD-10-CM | POA: Diagnosis not present

## 2015-11-13 DIAGNOSIS — M25561 Pain in right knee: Secondary | ICD-10-CM | POA: Diagnosis not present

## 2015-11-16 ENCOUNTER — Encounter: Payer: Self-pay | Admitting: Internal Medicine

## 2015-11-16 ENCOUNTER — Ambulatory Visit: Payer: 59 | Attending: Internal Medicine | Admitting: Internal Medicine

## 2015-11-16 VITALS — BP 129/62 | HR 60 | Temp 98.7°F | Resp 18 | Ht 72.0 in | Wt 211.0 lb

## 2015-11-16 DIAGNOSIS — I252 Old myocardial infarction: Secondary | ICD-10-CM | POA: Diagnosis not present

## 2015-11-16 DIAGNOSIS — Z79899 Other long term (current) drug therapy: Secondary | ICD-10-CM | POA: Diagnosis not present

## 2015-11-16 DIAGNOSIS — I10 Essential (primary) hypertension: Secondary | ICD-10-CM | POA: Diagnosis not present

## 2015-11-16 DIAGNOSIS — M25511 Pain in right shoulder: Secondary | ICD-10-CM | POA: Insufficient documentation

## 2015-11-16 DIAGNOSIS — G8929 Other chronic pain: Secondary | ICD-10-CM | POA: Diagnosis not present

## 2015-11-16 DIAGNOSIS — F17200 Nicotine dependence, unspecified, uncomplicated: Secondary | ICD-10-CM | POA: Diagnosis not present

## 2015-11-16 DIAGNOSIS — F17210 Nicotine dependence, cigarettes, uncomplicated: Secondary | ICD-10-CM | POA: Diagnosis not present

## 2015-11-16 DIAGNOSIS — E781 Pure hyperglyceridemia: Secondary | ICD-10-CM | POA: Insufficient documentation

## 2015-11-16 LAB — HEMOGLOBIN A1C
Hgb A1c MFr Bld: 5.6 % (ref <5.7)
Mean Plasma Glucose: 114 mg/dL

## 2015-11-16 MED ORDER — ACETAMINOPHEN-CODEINE #3 300-30 MG PO TABS: 1.0000 | ORAL_TABLET | Freq: Three times a day (TID) | ORAL | Status: DC | PRN

## 2015-11-16 MED FILL — ACETAMINOPHEN/COD #3 TABLET: 300-30 | 30 days supply | Qty: 90 | Fill #0

## 2015-11-16 NOTE — Progress Notes (Signed)
Patient ID: Steven Delacruz, male   DOB: 23-Aug-1950, 65 y.o.   MRN: [id number]   Steven Delacruz, is a 65 y.o. male  [id number]  OB:[id number]  DOB - [birth year]  Chief Complaint  Patient presents with  . Follow-up    HTN        Subjective:   Steven Delacruz is a 65 y.o. male history of hypertension, dyslipidemia, remote MI, chronic back pain and chronic right shoulder pain here today for a follow up visit. Patient complains of bilateral leg and knee pain feeling like pins and needles scaled currently at a 5/10. Patient request a refill on Tylenol 3.Patient has No headache, No chest pain, No abdominal pain - No Nausea, No new weakness tingling or numbness, No Cough - SOB. Patient continued to smoke cigarettes but working hard to quit.   No problems updated.  ALLERGIES: Allergies  Allergen Reactions  . Lisinopril Swelling    angioedema    PAST MEDICAL HISTORY: Past Medical History  Diagnosis Date  . Hypertension   . Chronic back pain   . Right shoulder pain   . Myocardial infarction ([place])   . Headache(784.0)   . Abnormal CT scan, pancreas - ? focal pancreatitis  05/13/2013  . Abdominal aortic atherosclerosis ([place]) 05/13/2013    MEDICATIONS AT HOME: Prior to Admission medications   Medication Sig Start Date End Date Taking? Authorizing Provider  acetaminophen-codeine (TYLENOL #3) 300-30 MG tablet Take 1 tablet by mouth every 8 (eight) hours as needed. 11/16/15  Yes Tresa Garter, MD  amLODipine (NORVASC) 10 MG tablet Take 1 tablet (10 mg total) by mouth daily. 03/16/15  Yes Tresa Garter, MD  atenolol (TENORMIN) 100 MG tablet Take 1 tablet (100 mg total) by mouth daily. 03/16/15  Yes Tresa Garter, MD  potassium chloride SA (K-DUR,KLOR-CON) 20 MEQ tablet Take 1 tablet (20 mEq total) by mouth daily. 07/17/15  Yes Tresa Garter, MD  hydrochlorothiazide (HYDRODIURIL) 25 MG tablet Take 1 tablet (25 mg total) by mouth daily. 03/16/15   Tresa Garter, MD     Objective:   Filed Vitals:   11/16/15 1057  BP: 129/62  Pulse: 60  Temp: 98.7 F (37.1 C)  TempSrc: Oral  Resp: 18  Height: 6' (1.829 m)  Weight: 211 lb (95.709 kg)  SpO2: 99%    Exam General appearance : Awake, alert, not in any distress. Speech Clear. Not toxic looking HEENT: Atraumatic and Normocephalic, pupils equally reactive to light and accomodation Neck: supple, no JVD. No cervical lymphadenopathy.  Chest:Good air entry bilaterally, no added sounds  CVS: S1 S2 regular, ectopic beats, no murmurs.  Abdomen: Bowel sounds present, Non tender and not distended with no gaurding, rigidity or rebound. Extremities: B/L Lower Ext shows no edema, both legs are warm to touch Neurology: Awake alert, and oriented X 3, CN II-XII intact, Non focal Skin: No Rash  Data Review Lab Results  Component Value Date   HGBA1C 6.0 07/17/2015   HGBA1C 5.80 12/12/2014   HGBA1C 5.4 12/21/2013     Assessment & Plan   1. Pain in joint, shoulder region, right  - acetaminophen-codeine (TYLENOL #3) 300-30 MG tablet; Take 1 tablet by mouth every 8 (eight) hours as needed.  Dispense: 90 tablet; Refill: 0  2. Essential hypertension  - COMPLETE METABOLIC PANEL WITH GFR - POCT glycosylated hemoglobin (Hb A1C) - Lipid panel - Urinalysis, Complete  We have discussed target BP range and blood pressure goal. I have advised patient  to check BP regularly and to call us back or report to clinic if the numbers are consistently higher than 140/90. We discussed the importance of compliance with medical therapy and DASH diet recommended, consequences of uncontrolled hypertension discussed.   - continue current BP medications  3. Tobacco use disorder  Ariz was counseled on the dangers of tobacco use, and was advised to quit. Reviewed strategies to maximize success, including removing cigarettes and smoking materials from environment, stress management and support of  family/friends.   4. Hypertriglyceridemia  To address this please limit saturated fat to no more than 7% of your calories, limit cholesterol to 200 mg/day, increase fiber and exercise as tolerated. If needed we may add another cholesterol lowering medication to your regimen.   Patient have been counseled extensively about nutrition and exercise  Return in about 6 months (around 05/18/2016) for Follow up Pain and comorbidities, Follow up HTN, Routine Follow Up.  The patient was given clear instructions to go to ER or return to medical center if symptoms don't improve, worsen or new problems develop. The patient verbalized understanding. The patient was told to call to get lab results if they haven't heard anything in the next week.   This note has been created with Surveyor, quantity. Any transcriptional errors are unintentional.    Angelica Chessman, MD, [place], Karilyn Cota, [place] and [facility], [place]   11/16/2015, 11:44 AM

## 2015-11-16 NOTE — Patient Instructions (Signed)
DASH Eating Plan °DASH stands for "Dietary Approaches to Stop Hypertension." The DASH eating plan is a healthy eating plan that has been shown to reduce high blood pressure (hypertension). Additional health benefits may include reducing the risk of type 2 diabetes mellitus, heart disease, and stroke. The DASH eating plan may also help with weight loss. °WHAT DO I NEED TO KNOW ABOUT THE DASH EATING PLAN? °For the DASH eating plan, you will follow these general guidelines: °· Choose foods with a percent daily value for sodium of less than 5% (as listed on the food label). °· Use salt-free seasonings or herbs instead of table salt or sea salt. °· Check with your health care provider or pharmacist before using salt substitutes. °· Eat lower-sodium products, often labeled as "lower sodium" or "no salt added." °· Eat fresh foods. °· Eat more vegetables, fruits, and low-fat dairy products. °· Choose whole grains. Look for the word "whole" as the first word in the ingredient list. °· Choose fish and skinless chicken or turkey more often than red meat. Limit fish, poultry, and meat to 6 oz (170 g) each day. °· Limit sweets, desserts, sugars, and sugary drinks. °· Choose heart-healthy fats. °· Limit cheese to 1 oz (28 g) per day. °· Eat more home-cooked food and less restaurant, buffet, and fast food. °· Limit fried foods. °· Cook foods using methods other than frying. °· Limit canned vegetables. If you do use them, rinse them well to decrease the sodium. °· When eating at a restaurant, ask that your food be prepared with less salt, or no salt if possible. °WHAT FOODS CAN I EAT? °Seek help from a dietitian for individual calorie needs. °Grains °Whole grain or whole wheat bread. Brown rice. Whole grain or whole wheat pasta. Quinoa, bulgur, and whole grain cereals. Low-sodium cereals. Corn or whole wheat flour tortillas. Whole grain cornbread. Whole grain crackers. Low-sodium crackers. °Vegetables °Fresh or frozen vegetables  (raw, steamed, roasted, or grilled). Low-sodium or reduced-sodium tomato and vegetable juices. Low-sodium or reduced-sodium tomato sauce and paste. Low-sodium or reduced-sodium canned vegetables.  °Fruits °All fresh, canned (in natural juice), or frozen fruits. °Meat and Other Protein Products °Ground beef (85% or leaner), grass-fed beef, or beef trimmed of fat. Skinless chicken or turkey. Ground chicken or turkey. Pork trimmed of fat. All fish and seafood. Eggs. Dried beans, peas, or lentils. Unsalted nuts and seeds. Unsalted canned beans. °Dairy °Low-fat dairy products, such as skim or 1% milk, 2% or reduced-fat cheeses, low-fat ricotta or cottage cheese, or plain low-fat yogurt. Low-sodium or reduced-sodium cheeses. °Fats and Oils °Tub margarines without trans fats. Light or reduced-fat mayonnaise and salad dressings (reduced sodium). Avocado. Safflower, olive, or canola oils. Natural peanut or almond butter. °Other °Unsalted popcorn and pretzels. °The items listed above may not be a complete list of recommended foods or beverages. Contact your dietitian for more options. °WHAT FOODS ARE NOT RECOMMENDED? °Grains °White bread. White pasta. White rice. Refined cornbread. Bagels and croissants. Crackers that contain trans fat. °Vegetables °Creamed or fried vegetables. Vegetables in a cheese sauce. Regular canned vegetables. Regular canned tomato sauce and paste. Regular tomato and vegetable juices. °Fruits °Dried fruits. Canned fruit in light or heavy syrup. Fruit juice. °Meat and Other Protein Products °Fatty cuts of meat. Ribs, chicken wings, bacon, sausage, bologna, salami, chitterlings, fatback, hot dogs, bratwurst, and packaged luncheon meats. Salted nuts and seeds. Canned beans with salt. °Dairy °Whole or 2% milk, cream, half-and-half, and cream cheese. Whole-fat or sweetened yogurt. Full-fat   cheeses or blue cheese. Nondairy creamers and whipped toppings. Processed cheese, cheese spreads, or cheese  curds. °Condiments °Onion and garlic salt, seasoned salt, table salt, and sea salt. Canned and packaged gravies. Worcestershire sauce. Tartar sauce. Barbecue sauce. Teriyaki sauce. Soy sauce, including reduced sodium. Steak sauce. Fish sauce. Oyster sauce. Cocktail sauce. Horseradish. Ketchup and mustard. Meat flavorings and tenderizers. Bouillon cubes. Hot sauce. Tabasco sauce. Marinades. Taco seasonings. Relishes. °Fats and Oils °Butter, stick margarine, lard, shortening, ghee, and bacon fat. Coconut, palm kernel, or palm oils. Regular salad dressings. °Other °Pickles and olives. Salted popcorn and pretzels. °The items listed above may not be a complete list of foods and beverages to avoid. Contact your dietitian for more information. °WHERE CAN I FIND MORE INFORMATION? °National Heart, Lung, and Blood Institute: www.nhlbi.nih.gov/health/health-topics/topics/dash/ °  °This information is not intended to replace advice given to you by your health care provider. Make sure you discuss any questions you have with your health care provider. °  °Document Released: 06/06/2011 Document Revised: 07/08/2014 Document Reviewed: 04/21/2013 °Elsevier Interactive Patient Education ©2016 Elsevier Inc. ° °Hypertension °Hypertension, commonly called high blood pressure, is when the force of blood pumping through your arteries is too strong. Your arteries are the blood vessels that carry blood from your heart throughout your body. A blood pressure reading consists of a higher number over a lower number, such as 110/72. The higher number (systolic) is the pressure inside your arteries when your heart pumps. The lower number (diastolic) is the pressure inside your arteries when your heart relaxes. Ideally you want your blood pressure below 120/80. °Hypertension forces your heart to work harder to pump blood. Your arteries may become narrow or stiff. Having untreated or uncontrolled hypertension can cause heart attack, stroke, kidney  disease, and other problems. °RISK FACTORS °Some risk factors for high blood pressure are controllable. Others are not.  °Risk factors you cannot control include:  °· Race. You may be at higher risk if you are African American. °· Age. Risk increases with age. °· Gender. Men are at higher risk than women before age 45 years. After age 65, women are at higher risk than men. °Risk factors you can control include: °· Not getting enough exercise or physical activity. °· Being overweight. °· Getting too much fat, sugar, calories, or salt in your diet. °· Drinking too much alcohol. °SIGNS AND SYMPTOMS °Hypertension does not usually cause signs or symptoms. Extremely high blood pressure (hypertensive crisis) may cause headache, anxiety, shortness of breath, and nosebleed. °DIAGNOSIS °To check if you have hypertension, your health care provider will measure your blood pressure while you are seated, with your arm held at the level of your heart. It should be measured at least twice using the same arm. Certain conditions can cause a difference in blood pressure between your right and left arms. A blood pressure reading that is higher than normal on one occasion does not mean that you need treatment. If it is not clear whether you have high blood pressure, you may be asked to return on a different day to have your blood pressure checked again. Or, you may be asked to monitor your blood pressure at home for 1 or more weeks. °TREATMENT °Treating high blood pressure includes making lifestyle changes and possibly taking medicine. Living a healthy lifestyle can help lower high blood pressure. You may need to change some of your habits. °Lifestyle changes may include: °· Following the DASH diet. This diet is high in fruits, vegetables, and whole   grains. It is low in salt, red meat, and added sugars. °· Keep your sodium intake below 2,300 mg per day. °· Getting at least 30-45 minutes of aerobic exercise at least 4 times per  week. °· Losing weight if necessary. °· Not smoking. °· Limiting alcoholic beverages. °· Learning ways to reduce stress. °Your health care provider may prescribe medicine if lifestyle changes are not enough to get your blood pressure under control, and if one of the following is true: °· You are 18-59 years of age and your systolic blood pressure is above 140. °· You are 60 years of age or older, and your systolic blood pressure is above 150. °· Your diastolic blood pressure is above 90. °· You have diabetes, and your systolic blood pressure is over 140 or your diastolic blood pressure is over 90. °· You have kidney disease and your blood pressure is above 140/90. °· You have heart disease and your blood pressure is above 140/90. °Your personal target blood pressure may vary depending on your medical conditions, your age, and other factors. °HOME CARE INSTRUCTIONS °· Have your blood pressure rechecked as directed by your health care provider.   °· Take medicines only as directed by your health care provider. Follow the directions carefully. Blood pressure medicines must be taken as prescribed. The medicine does not work as well when you skip doses. Skipping doses also puts you at risk for problems. °· Do not smoke.   °· Monitor your blood pressure at home as directed by your health care provider.  °SEEK MEDICAL CARE IF:  °· You think you are having a reaction to medicines taken. °· You have recurrent headaches or feel dizzy. °· You have swelling in your ankles. °· You have trouble with your vision. °SEEK IMMEDIATE MEDICAL CARE IF: °· You develop a severe headache or confusion. °· You have unusual weakness, numbness, or feel faint. °· You have severe chest or abdominal pain. °· You vomit repeatedly. °· You have trouble breathing. °MAKE SURE YOU:  °· Understand these instructions. °· Will watch your condition. °· Will get help right away if you are not doing well or get worse. °  °This information is not intended to  replace advice given to you by your health care provider. Make sure you discuss any questions you have with your health care provider. °  °Document Released: 06/17/2005 Document Revised: 11/01/2014 Document Reviewed: 04/09/2013 °Elsevier Interactive Patient Education ©2016 Elsevier Inc. ° °

## 2015-11-16 NOTE — Progress Notes (Signed)
Patient is here for 3 month FU HTN  Patient complains of bilateral leg and knee pain feeling like pins and needles scaled currently at a 5.  Patient has taken medication today. Patient has not eaten.  Patient request a refill on Tylenol 3.

## 2015-11-17 LAB — COMPLETE METABOLIC PANEL WITH GFR
ALT: 10 U/L (ref 9–46)
AST: 15 U/L (ref 10–35)
Albumin: 3.7 g/dL (ref 3.6–5.1)
Alkaline Phosphatase: 62 U/L (ref 40–115)
BUN: 6 mg/dL — ABNORMAL LOW (ref 7–25)
CO2: 23 mmol/L (ref 20–31)
Calcium: 8.8 mg/dL (ref 8.6–10.3)
Chloride: 103 mmol/L (ref 98–110)
Creat: 0.93 mg/dL (ref 0.70–1.25)
GFR, Est African American: >89 mL/min (ref >=60)
GFR, Est Non African American: 87 mL/min (ref >=60)
Glucose, Bld: 94 mg/dL (ref 65–99)
Potassium: 3.3 mmol/L — ABNORMAL LOW (ref 3.5–5.3)
Sodium: 141 mmol/L (ref 135–146)
Total Bilirubin: 0.5 mg/dL (ref 0.2–1.2)
Total Protein: 5.8 g/dL — ABNORMAL LOW (ref 6.1–8.1)

## 2015-11-17 LAB — URINALYSIS, COMPLETE
Bacteria, UA: NONE SEEN [HPF]
Bilirubin Urine: NEGATIVE
Casts: NONE SEEN [LPF]
Crystals: NONE SEEN [HPF]
Glucose, UA: NEGATIVE
Hgb urine dipstick: NEGATIVE
Ketones, ur: NEGATIVE
Leukocytes, UA: NEGATIVE
Nitrite: NEGATIVE
Protein, ur: NEGATIVE
RBC / HPF: NONE SEEN RBC/HPF (ref <=2)
Specific Gravity, Urine: 1.017 (ref 1.001–1.035)
Squamous Epithelial / LPF: NONE SEEN [HPF] (ref <=5)
WBC, UA: NONE SEEN WBC/HPF (ref <=5)
Yeast: NONE SEEN [HPF]
pH: 5.5 (ref 5.0–8.0)

## 2015-11-17 LAB — LIPID PANEL
Cholesterol: 185 mg/dL (ref 125–200)
HDL: 131 mg/dL (ref >=40)
LDL Cholesterol: 35 mg/dL (ref <130)
Total CHOL/HDL Ratio: 1.4 Ratio (ref <=5.0)
Triglycerides: 97 mg/dL (ref <150)
VLDL: 19 mg/dL (ref <30)

## 2015-11-20 DIAGNOSIS — M25561 Pain in right knee: Secondary | ICD-10-CM | POA: Diagnosis not present

## 2015-11-20 DIAGNOSIS — M25562 Pain in left knee: Secondary | ICD-10-CM | POA: Diagnosis not present

## 2015-11-20 DIAGNOSIS — M6281 Muscle weakness (generalized): Secondary | ICD-10-CM | POA: Diagnosis not present

## 2015-11-20 DIAGNOSIS — M25572 Pain in left ankle and joints of left foot: Secondary | ICD-10-CM | POA: Diagnosis not present

## 2015-11-24 DIAGNOSIS — M25562 Pain in left knee: Secondary | ICD-10-CM | POA: Diagnosis not present

## 2015-11-24 DIAGNOSIS — M6281 Muscle weakness (generalized): Secondary | ICD-10-CM | POA: Diagnosis not present

## 2015-11-24 DIAGNOSIS — M25572 Pain in left ankle and joints of left foot: Secondary | ICD-10-CM | POA: Diagnosis not present

## 2015-11-24 DIAGNOSIS — M25561 Pain in right knee: Secondary | ICD-10-CM | POA: Diagnosis not present

## 2015-11-28 ENCOUNTER — Telehealth: Payer: Self-pay | Admitting: *Deleted

## 2015-11-28 DIAGNOSIS — M25572 Pain in left ankle and joints of left foot: Secondary | ICD-10-CM | POA: Diagnosis not present

## 2015-11-28 DIAGNOSIS — M6281 Muscle weakness (generalized): Secondary | ICD-10-CM | POA: Diagnosis not present

## 2015-11-28 DIAGNOSIS — M25562 Pain in left knee: Secondary | ICD-10-CM | POA: Diagnosis not present

## 2015-11-28 DIAGNOSIS — M25561 Pain in right knee: Secondary | ICD-10-CM | POA: Diagnosis not present

## 2015-11-28 NOTE — Telephone Encounter (Signed)
MA unable to leave a voice message on any contacts.

## 2015-11-28 NOTE — Telephone Encounter (Signed)
-----   Message from Tresa Garter, MD sent at 11/22/2015 10:35 AM EDT ----- Please inform patient that his laboratory results are mostly normal, potassium is however slightly low. Advise patient to take potassium tablets as prescribed. Call for refill if and when necessary.

## 2015-12-01 DIAGNOSIS — M25562 Pain in left knee: Secondary | ICD-10-CM | POA: Diagnosis not present

## 2015-12-01 DIAGNOSIS — M25572 Pain in left ankle and joints of left foot: Secondary | ICD-10-CM | POA: Diagnosis not present

## 2015-12-01 DIAGNOSIS — M6281 Muscle weakness (generalized): Secondary | ICD-10-CM | POA: Diagnosis not present

## 2015-12-01 DIAGNOSIS — M25561 Pain in right knee: Secondary | ICD-10-CM | POA: Diagnosis not present

## 2015-12-04 DIAGNOSIS — M6281 Muscle weakness (generalized): Secondary | ICD-10-CM | POA: Diagnosis not present

## 2015-12-04 DIAGNOSIS — M25561 Pain in right knee: Secondary | ICD-10-CM | POA: Diagnosis not present

## 2015-12-04 DIAGNOSIS — M25562 Pain in left knee: Secondary | ICD-10-CM | POA: Diagnosis not present

## 2015-12-04 DIAGNOSIS — M25572 Pain in left ankle and joints of left foot: Secondary | ICD-10-CM | POA: Diagnosis not present

## 2015-12-08 DIAGNOSIS — M25572 Pain in left ankle and joints of left foot: Secondary | ICD-10-CM | POA: Diagnosis not present

## 2015-12-08 DIAGNOSIS — M6281 Muscle weakness (generalized): Secondary | ICD-10-CM | POA: Diagnosis not present

## 2015-12-08 DIAGNOSIS — M25562 Pain in left knee: Secondary | ICD-10-CM | POA: Diagnosis not present

## 2015-12-08 DIAGNOSIS — M25561 Pain in right knee: Secondary | ICD-10-CM | POA: Diagnosis not present

## 2015-12-12 DIAGNOSIS — M25572 Pain in left ankle and joints of left foot: Secondary | ICD-10-CM | POA: Diagnosis not present

## 2015-12-12 DIAGNOSIS — M6281 Muscle weakness (generalized): Secondary | ICD-10-CM | POA: Diagnosis not present

## 2015-12-12 DIAGNOSIS — M25561 Pain in right knee: Secondary | ICD-10-CM | POA: Diagnosis not present

## 2015-12-12 DIAGNOSIS — M25562 Pain in left knee: Secondary | ICD-10-CM | POA: Diagnosis not present

## 2015-12-18 DIAGNOSIS — M25562 Pain in left knee: Secondary | ICD-10-CM | POA: Diagnosis not present

## 2015-12-18 DIAGNOSIS — M25572 Pain in left ankle and joints of left foot: Secondary | ICD-10-CM | POA: Diagnosis not present

## 2015-12-18 DIAGNOSIS — M25561 Pain in right knee: Secondary | ICD-10-CM | POA: Diagnosis not present

## 2015-12-18 DIAGNOSIS — M6281 Muscle weakness (generalized): Secondary | ICD-10-CM | POA: Diagnosis not present

## 2015-12-22 DIAGNOSIS — M25561 Pain in right knee: Secondary | ICD-10-CM | POA: Diagnosis not present

## 2015-12-22 DIAGNOSIS — M25572 Pain in left ankle and joints of left foot: Secondary | ICD-10-CM | POA: Diagnosis not present

## 2015-12-22 DIAGNOSIS — M25562 Pain in left knee: Secondary | ICD-10-CM | POA: Diagnosis not present

## 2015-12-22 DIAGNOSIS — M6281 Muscle weakness (generalized): Secondary | ICD-10-CM | POA: Diagnosis not present

## 2015-12-26 DIAGNOSIS — M25561 Pain in right knee: Secondary | ICD-10-CM | POA: Diagnosis not present

## 2015-12-26 DIAGNOSIS — M25572 Pain in left ankle and joints of left foot: Secondary | ICD-10-CM | POA: Diagnosis not present

## 2015-12-26 DIAGNOSIS — M6281 Muscle weakness (generalized): Secondary | ICD-10-CM | POA: Diagnosis not present

## 2015-12-26 DIAGNOSIS — M25562 Pain in left knee: Secondary | ICD-10-CM | POA: Diagnosis not present

## 2015-12-29 DIAGNOSIS — M25562 Pain in left knee: Secondary | ICD-10-CM | POA: Diagnosis not present

## 2015-12-29 DIAGNOSIS — M25561 Pain in right knee: Secondary | ICD-10-CM | POA: Diagnosis not present

## 2015-12-29 DIAGNOSIS — M6281 Muscle weakness (generalized): Secondary | ICD-10-CM | POA: Diagnosis not present

## 2015-12-29 DIAGNOSIS — M25572 Pain in left ankle and joints of left foot: Secondary | ICD-10-CM | POA: Diagnosis not present

## 2016-01-01 DIAGNOSIS — M6281 Muscle weakness (generalized): Secondary | ICD-10-CM | POA: Diagnosis not present

## 2016-01-01 DIAGNOSIS — M25561 Pain in right knee: Secondary | ICD-10-CM | POA: Diagnosis not present

## 2016-01-01 DIAGNOSIS — M25572 Pain in left ankle and joints of left foot: Secondary | ICD-10-CM | POA: Diagnosis not present

## 2016-01-01 DIAGNOSIS — M25562 Pain in left knee: Secondary | ICD-10-CM | POA: Diagnosis not present

## 2016-01-12 DIAGNOSIS — M25561 Pain in right knee: Secondary | ICD-10-CM | POA: Diagnosis not present

## 2016-01-12 DIAGNOSIS — M6281 Muscle weakness (generalized): Secondary | ICD-10-CM | POA: Diagnosis not present

## 2016-01-12 DIAGNOSIS — M25562 Pain in left knee: Secondary | ICD-10-CM | POA: Diagnosis not present

## 2016-01-12 DIAGNOSIS — M25572 Pain in left ankle and joints of left foot: Secondary | ICD-10-CM | POA: Diagnosis not present

## 2016-03-01 ENCOUNTER — Other Ambulatory Visit: Payer: Self-pay | Admitting: Internal Medicine

## 2016-03-01 DIAGNOSIS — I10 Essential (primary) hypertension: Secondary | ICD-10-CM

## 2016-05-30 ENCOUNTER — Encounter: Payer: Self-pay | Admitting: Internal Medicine

## 2016-05-30 ENCOUNTER — Ambulatory Visit: Payer: Medicare Other | Attending: Internal Medicine | Admitting: Internal Medicine

## 2016-05-30 VITALS — BP 131/70 | HR 51 | Temp 97.9°F | Resp 18 | Ht 72.0 in | Wt 218.2 lb

## 2016-05-30 DIAGNOSIS — F17200 Nicotine dependence, unspecified, uncomplicated: Secondary | ICD-10-CM | POA: Diagnosis not present

## 2016-05-30 DIAGNOSIS — M545 Low back pain, unspecified: Secondary | ICD-10-CM

## 2016-05-30 DIAGNOSIS — L602 Onychogryphosis: Secondary | ICD-10-CM

## 2016-05-30 DIAGNOSIS — Z888 Allergy status to other drugs, medicaments and biological substances status: Secondary | ICD-10-CM | POA: Insufficient documentation

## 2016-05-30 DIAGNOSIS — I10 Essential (primary) hypertension: Secondary | ICD-10-CM | POA: Diagnosis not present

## 2016-05-30 DIAGNOSIS — I700 Atherosclerosis of aorta: Secondary | ICD-10-CM | POA: Insufficient documentation

## 2016-05-30 DIAGNOSIS — I252 Old myocardial infarction: Secondary | ICD-10-CM | POA: Insufficient documentation

## 2016-05-30 DIAGNOSIS — E785 Hyperlipidemia, unspecified: Secondary | ICD-10-CM | POA: Diagnosis not present

## 2016-05-30 DIAGNOSIS — R51 Headache: Secondary | ICD-10-CM | POA: Diagnosis not present

## 2016-05-30 DIAGNOSIS — Z23 Encounter for immunization: Secondary | ICD-10-CM | POA: Insufficient documentation

## 2016-05-30 DIAGNOSIS — E781 Pure hyperglyceridemia: Secondary | ICD-10-CM

## 2016-05-30 DIAGNOSIS — M109 Gout, unspecified: Secondary | ICD-10-CM | POA: Diagnosis not present

## 2016-05-30 DIAGNOSIS — G8929 Other chronic pain: Secondary | ICD-10-CM | POA: Diagnosis not present

## 2016-05-30 LAB — COMPLETE METABOLIC PANEL WITH GFR
ALT: 17 U/L (ref 9–46)
AST: 15 U/L (ref 10–35)
Albumin: 4.1 g/dL (ref 3.6–5.1)
Alkaline Phosphatase: 72 U/L (ref 40–115)
BUN: 16 mg/dL (ref 7–25)
CO2: 27 mmol/L (ref 20–31)
Calcium: 10.1 mg/dL (ref 8.6–10.3)
Chloride: 105 mmol/L (ref 98–110)
Creat: 1.11 mg/dL (ref 0.70–1.25)
GFR, Est African American: 81 mL/min (ref >=60)
GFR, Est Non African American: 70 mL/min (ref >=60)
Glucose, Bld: 102 mg/dL — ABNORMAL HIGH (ref 65–99)
Potassium: 3.7 mmol/L (ref 3.5–5.3)
Sodium: 141 mmol/L (ref 135–146)
Total Bilirubin: 0.5 mg/dL (ref 0.2–1.2)
Total Protein: 6.5 g/dL (ref 6.1–8.1)

## 2016-05-30 MED ORDER — ACETAMINOPHEN-CODEINE #3 300-30 MG PO TABS: 1.0000 | ORAL_TABLET | Freq: Three times a day (TID) | ORAL | 0 refills | Status: DC | PRN

## 2016-05-30 MED ORDER — HYDROCHLOROTHIAZIDE 25 MG PO TABS: 25.0000 mg | ORAL_TABLET | Freq: Every day | ORAL | 3 refills | Status: DC

## 2016-05-30 MED ORDER — POTASSIUM CHLORIDE CRYS ER 20 MEQ PO TBCR: 20.0000 meq | EXTENDED_RELEASE_TABLET | Freq: Every day | ORAL | 3 refills | Status: DC

## 2016-05-30 MED ORDER — ATENOLOL 100 MG PO TABS: 100.0000 mg | ORAL_TABLET | Freq: Every day | ORAL | 3 refills | Status: DC

## 2016-05-30 MED ORDER — AMLODIPINE BESYLATE 10 MG PO TABS: 10.0000 mg | ORAL_TABLET | Freq: Every day | ORAL | 3 refills | Status: DC

## 2016-05-30 MED ORDER — ACETAMINOPHEN-CODEINE #3 300-30 MG PO TABS
1.0000 | ORAL_TABLET | Freq: Three times a day (TID) | ORAL | 0 refills | Status: DC | PRN
Start: 2016-05-30 — End: 2016-05-30

## 2016-05-30 MED FILL — HYDROCHLOROTHIAZIDE 25 MG T: 25 | 90 days supply | Qty: 90 | Fill #0

## 2016-05-30 MED FILL — ACETAMINOPHEN/COD #3 TABLET: 300-30 | 30 days supply | Qty: 90 | Fill #0

## 2016-05-30 MED FILL — POTASSIUM CL ER 20 MEQ TAB: 20 | 30 days supply | Qty: 30 | Fill #0

## 2016-05-30 MED FILL — AMLODIPINE BESYLATE 10 MG T: 10 | 90 days supply | Qty: 90 | Fill #0

## 2016-05-30 NOTE — Progress Notes (Signed)
Patient is here for HTN FU  Patient denies pain. Patient is sore from working out.  Patient has taken medication today. Patient has not eaten today.  Patient would like flu vaccine today. Patient tolerated vaccine well today.  Patient tolerated tdap vaccine well today.

## 2016-05-30 NOTE — Progress Notes (Signed)
Steven Delacruz, is a 65 y.o. male  [id number]  OB:[id number]  DOB - 12/15/1950  Chief Complaint  Patient presents with  . Hypertension      Subjective:   Steven Delacruz is a 65 y.o. male with history of hypertension, dyslipidemia, remote MI, chronic back pain and chronic right shoulder pain here today for a follow up visit and medication refill. Patient has started going to the gym at least 3 days a week, he is also actively trying to quit smoking, using nicotine gum and chewing sticks. Patient requesting refill of Tylenol 3 for his chronic pain, and especially now that he is sore from physical activities at the gym. His blood pressure is controlled with the current medications. He has no other complaints today. He needs to see a podiatrist for assistance with toe nail care. Patient had an episode of gout flare few months ago, had swollen right big toe, did not go to the emergency room or urgent care, did not come to the clinic but swelling and pain resolved with ibuprofen. Patient denies any urinary symptoms today. Patient has No headache, No chest pain, No abdominal pain - No Nausea, No new weakness tingling or numbness, No Cough - SOB.  Problem  Needs Flu Shot    ALLERGIES: Allergies  Allergen Reactions  . Lisinopril Swelling    angioedema    PAST MEDICAL HISTORY: Past Medical History:  Diagnosis Date  . Abdominal aortic atherosclerosis ([place]) 05/13/2013  . Abnormal CT scan, pancreas - ? focal pancreatitis  05/13/2013  . Chronic back pain   . Headache(784.0)   . Hypertension   . Myocardial infarction   . Right shoulder pain     MEDICATIONS AT HOME: Prior to Admission medications   Medication Sig Start Date End Date Taking? Authorizing Provider  acetaminophen-codeine (TYLENOL #3) 300-30 MG tablet Take 1 tablet by mouth every 8 (eight) hours as needed. 05/30/16  Yes Tresa Garter, MD  amLODipine (NORVASC) 10 MG tablet Take 1 tablet (10 mg total) by mouth  daily. 05/30/16  Yes Tresa Garter, MD  atenolol (TENORMIN) 100 MG tablet Take 1 tablet (100 mg total) by mouth daily. 05/30/16  Yes Tresa Garter, MD  hydrochlorothiazide (HYDRODIURIL) 25 MG tablet Take 1 tablet (25 mg total) by mouth daily. 05/30/16  Yes Tresa Garter, MD  potassium chloride SA (K-DUR,KLOR-CON) 20 MEQ tablet Take 1 tablet (20 mEq total) by mouth daily. 05/30/16  Yes Tresa Garter, MD    Objective:   Vitals:   05/30/16 0905  BP: 131/70  Pulse: (!) 51  Resp: 18  Temp: 97.9 F (36.6 C)  TempSrc: Oral  SpO2: 98%  Weight: 218 lb 3.2 oz (99 kg)  Height: 6' (1.829 m)   Exam General appearance : Awake, alert, not in any distress. Speech Clear. Not toxic looking HEENT: Atraumatic and Normocephalic, pupils equally reactive to light and accomodation Neck: Supple, no JVD. No cervical lymphadenopathy.  Chest: Good air entry bilaterally, no added sounds  CVS: S1 S2 regular, no murmurs.  Abdomen: Full, Bowel sounds present, Non tender and not distended with no gaurding, rigidity or rebound. Extremities: B/L Lower Ext shows no edema, both legs are warm to touch Neurology: Awake alert, and oriented X 3, CN II-XII intact, Non focal Skin: No Rash Foot Exam: Hypertrophic toenails, otherwise normal exam  Data Review Lab Results  Component Value Date   HGBA1C 5.6 11/16/2015   HGBA1C 6.0 07/17/2015   HGBA1C 5.80 12/12/2014  Assessment & Plan   1. Needs flu shot  - Flu Vaccine QUAD 36+ mos PF IM (Fluarix & Fluzone Quad PF) - Tetanus shot today  2. Essential hypertension  - amLODipine (NORVASC) 10 MG tablet; Take 1 tablet (10 mg total) by mouth daily.  Dispense: 90 tablet; Refill: 3 - atenolol (TENORMIN) 100 MG tablet; Take 1 tablet (100 mg total) by mouth daily.  Dispense: 90 tablet; Refill: 3 - hydrochlorothiazide (HYDRODIURIL) 25 MG tablet; Take 1 tablet (25 mg total) by mouth daily.  Dispense: 90 tablet; Refill: 3 - potassium chloride SA  (K-DUR,KLOR-CON) 20 MEQ tablet; Take 1 tablet (20 mEq total) by mouth daily.  Dispense: 30 tablet; Refill: 3 - COMPLETE METABOLIC PANEL WITH GFR  3. Tobacco use disorder  Steven Delacruz was counseled on the dangers of tobacco use, and was advised to quit. Reviewed strategies to maximize success, including removing cigarettes and smoking materials from environment, stress management and support of family/friends.  4. Back pain at L4-L5 level  - acetaminophen-codeine (TYLENOL #3) 300-30 MG tablet; Take 1 tablet by mouth every 8 (eight) hours as needed.  Dispense: 90 tablet; Refill: 0  Patient have been counseled extensively about nutrition and exercise. Other issues discussed during this visit include: low cholesterol diet, weight control and daily exercise, foot care, annual eye examinations at Ophthalmology, importance of adherence with medications and regular follow-up. We also discussed long term complications of uncontrolled hypertension.   We also discussed Advanced Directive and code status. Patient has a Chief Executive Officer and has documents about his directives. Full Code.   Return in about 6 months (around 11/27/2016) for Follow up HTN, Follow up Pain and comorbidities.  The patient was given clear instructions to go to ER or return to medical center if symptoms don't improve, worsen or new problems develop. The patient verbalized understanding. The patient was told to call to get lab results if they haven't heard anything in the next week.   This note has been created with Surveyor, quantity. Any transcriptional errors are unintentional.    Angelica Chessman, MD, [place], Karilyn Cota, [place] and [place], [place]   05/30/2016, 9:42 AM

## 2016-05-30 NOTE — Patient Instructions (Signed)
DASH Eating Plan DASH stands for "Dietary Approaches to Stop Hypertension." The DASH eating plan is a healthy eating plan that has been shown to reduce high blood pressure (hypertension). Additional health benefits may include reducing the risk of type 2 diabetes mellitus, heart disease, and stroke. The DASH eating plan may also help with weight loss. What do I need to know about the DASH eating plan? For the DASH eating plan, you will follow these general guidelines:  Choose foods with less than 150 milligrams of sodium per serving (as listed on the food label).  Use salt-free seasonings or herbs instead of table salt or sea salt.  Check with your health care provider or pharmacist before using salt substitutes.  Eat lower-sodium products. These are often labeled as "low-sodium" or "no salt added."  Eat fresh foods. Avoid eating a lot of canned foods.  Eat more vegetables, fruits, and low-fat dairy products.  Choose whole grains. Look for the word "whole" as the first word in the ingredient list.  Choose fish and skinless chicken or turkey more often than red meat. Limit fish, poultry, and meat to 6 oz (170 g) each day.  Limit sweets, desserts, sugars, and sugary drinks.  Choose heart-healthy fats.  Eat more home-cooked food and less restaurant, buffet, and fast food.  Limit fried foods.  Do not fry foods. Cook foods using methods such as baking, boiling, grilling, and broiling instead.  When eating at a restaurant, ask that your food be prepared with less salt, or no salt if possible. What foods can I eat? Seek help from a dietitian for individual calorie needs. Grains  Whole grain or whole wheat bread. Brown rice. Whole grain or whole wheat pasta. Quinoa, bulgur, and whole grain cereals. Low-sodium cereals. Corn or whole wheat flour tortillas. Whole grain cornbread. Whole grain crackers. Low-sodium crackers. Vegetables  Fresh or frozen vegetables (raw, steamed, roasted, or  grilled). Low-sodium or reduced-sodium tomato and vegetable juices. Low-sodium or reduced-sodium tomato sauce and paste. Low-sodium or reduced-sodium canned vegetables. Fruits  All fresh, canned (in natural juice), or frozen fruits. Meat and Other Protein Products  Ground beef (85% or leaner), grass-fed beef, or beef trimmed of fat. Skinless chicken or turkey. Ground chicken or turkey. Pork trimmed of fat. All fish and seafood. Eggs. Dried beans, peas, or lentils. Unsalted nuts and seeds. Unsalted canned beans. Dairy  Low-fat dairy products, such as skim or 1% milk, 2% or reduced-fat cheeses, low-fat ricotta or cottage cheese, or plain low-fat yogurt. Low-sodium or reduced-sodium cheeses. Fats and Oils  Tub margarines without trans fats. Light or reduced-fat mayonnaise and salad dressings (reduced sodium). Avocado. Safflower, olive, or canola oils. Natural peanut or almond butter. Other  Unsalted popcorn and pretzels. The items listed above may not be a complete list of recommended foods or beverages. Contact your dietitian for more options.  What foods are not recommended? Grains  White bread. White pasta. White rice. Refined cornbread. Bagels and croissants. Crackers that contain trans fat. Vegetables  Creamed or fried vegetables. Vegetables in a cheese sauce. Regular canned vegetables. Regular canned tomato sauce and paste. Regular tomato and vegetable juices. Fruits  Canned fruit in light or heavy syrup. Fruit juice. Meat and Other Protein Products  Fatty cuts of meat. Ribs, chicken wings, bacon, sausage, bologna, salami, chitterlings, fatback, hot dogs, bratwurst, and packaged luncheon meats. Salted nuts and seeds. Canned beans with salt. Dairy  Whole or 2% milk, cream, half-and-half, and cream cheese. Whole-fat or sweetened yogurt. Full-fat cheeses   or blue cheese. Nondairy creamers and whipped toppings. Processed cheese, cheese spreads, or cheese curds. Condiments  Onion and garlic  salt, seasoned salt, table salt, and sea salt. Canned and packaged gravies. Worcestershire sauce. Tartar sauce. Barbecue sauce. Teriyaki sauce. Soy sauce, including reduced sodium. Steak sauce. Fish sauce. Oyster sauce. Cocktail sauce. Horseradish. Ketchup and mustard. Meat flavorings and tenderizers. Bouillon cubes. Hot sauce. Tabasco sauce. Marinades. Taco seasonings. Relishes. Fats and Oils  Butter, stick margarine, lard, shortening, ghee, and bacon fat. Coconut, palm kernel, or palm oils. Regular salad dressings. Other  Pickles and olives. Salted popcorn and pretzels. The items listed above may not be a complete list of foods and beverages to avoid. Contact your dietitian for more information.  Where can I find more information? National Heart, Lung, and Blood Institute: travelstabloid.com This information is not intended to replace advice given to you by your health care provider. Make sure you discuss any questions you have with your health care provider. Document Released: 06/06/2011 Document Revised: 11/23/2015 Document Reviewed: 04/21/2013 Elsevier Interactive Patient Education  2017 Elsevier Inc. Hypertension Hypertension, commonly called high blood pressure, is when the force of blood pumping through your arteries is too strong. Your arteries are the blood vessels that carry blood from your heart throughout your body. A blood pressure reading consists of a higher number over a lower number, such as 110/72. The higher number (systolic) is the pressure inside your arteries when your heart pumps. The lower number (diastolic) is the pressure inside your arteries when your heart relaxes. Ideally you want your blood pressure below 120/80. Hypertension forces your heart to work harder to pump blood. Your arteries may become narrow or stiff. Having untreated or uncontrolled hypertension can cause heart attack, stroke, kidney disease, and other problems. What  increases the risk? Some risk factors for high blood pressure are controllable. Others are not. Risk factors you cannot control include:  Race. You may be at higher risk if you are African American.  Age. Risk increases with age.  Gender. Men are at higher risk than women before age 80 years. After age 60, women are at higher risk than men. Risk factors you can control include:  Not getting enough exercise or physical activity.  Being overweight.  Getting too much fat, sugar, calories, or salt in your diet.  Drinking too much alcohol. What are the signs or symptoms? Hypertension does not usually cause signs or symptoms. Extremely high blood pressure (hypertensive crisis) may cause headache, anxiety, shortness of breath, and nosebleed. How is this diagnosed? To check if you have hypertension, your health care provider will measure your blood pressure while you are seated, with your arm held at the level of your heart. It should be measured at least twice using the same arm. Certain conditions can cause a difference in blood pressure between your right and left arms. A blood pressure reading that is higher than normal on one occasion does not mean that you need treatment. If it is not clear whether you have high blood pressure, you may be asked to return on a different day to have your blood pressure checked again. Or, you may be asked to monitor your blood pressure at home for 1 or more weeks. How is this treated? Treating high blood pressure includes making lifestyle changes and possibly taking medicine. Living a healthy lifestyle can help lower high blood pressure. You may need to change some of your habits. Lifestyle changes may include:  Following the DASH diet. This  diet is high in fruits, vegetables, and whole grains. It is low in salt, red meat, and added sugars.  Keep your sodium intake below 2,300 mg per day.  Getting at least 30-45 minutes of aerobic exercise at least 4 times  per week.  Losing weight if necessary.  Not smoking.  Limiting alcoholic beverages.  Learning ways to reduce stress. Your health care provider may prescribe medicine if lifestyle changes are not enough to get your blood pressure under control, and if one of the following is true:  You are 24-39 years of age and your systolic blood pressure is above 140.  You are 66 years of age or older, and your systolic blood pressure is above 150.  Your diastolic blood pressure is above 90.  You have diabetes, and your systolic blood pressure is over 226 or your diastolic blood pressure is over 90.  You have kidney disease and your blood pressure is above 140/90.  You have heart disease and your blood pressure is above 140/90. Your personal target blood pressure may vary depending on your medical conditions, your age, and other factors. Follow these instructions at home:  Have your blood pressure rechecked as directed by your health care provider.  Take medicines only as directed by your health care provider. Follow the directions carefully. Blood pressure medicines must be taken as prescribed. The medicine does not work as well when you skip doses. Skipping doses also puts you at risk for problems.  Do not smoke.  Monitor your blood pressure at home as directed by your health care provider. Contact a health care provider if:  You think you are having a reaction to medicines taken.  You have recurrent headaches or feel dizzy.  You have swelling in your ankles.  You have trouble with your vision. Get help right away if:  You develop a severe headache or confusion.  You have unusual weakness, numbness, or feel faint.  You have severe chest or abdominal pain.  You vomit repeatedly.  You have trouble breathing. This information is not intended to replace advice given to you by your health care provider. Make sure you discuss any questions you have with your health care  provider. Document Released: 06/17/2005 Document Revised: 11/23/2015 Document Reviewed: 04/09/2013 Elsevier Interactive Patient Education  2017 [employer].

## 2016-06-03 ENCOUNTER — Telehealth: Payer: Self-pay | Admitting: *Deleted

## 2016-06-03 NOTE — Telephone Encounter (Signed)
-----   Message from Tresa Garter, MD sent at 06/03/2016  3:07 PM EST ----- Normal lab results

## 2016-06-03 NOTE — Telephone Encounter (Signed)
Patient verified DOB Patient is aware of lab results being normal. Patient expressed his understanding and had no further questions at this time.

## 2016-06-19 ENCOUNTER — Emergency Department (HOSPITAL_COMMUNITY)
Admission: EM | Admit: 2016-06-19 | Discharge: 2016-06-19 | Disposition: A | Discharge status: Home / Self Care | Payer: Medicare Other | Attending: Emergency Medicine | Admitting: Emergency Medicine

## 2016-06-19 ENCOUNTER — Emergency Department (HOSPITAL_COMMUNITY): Payer: Medicare Other

## 2016-06-19 ENCOUNTER — Encounter (HOSPITAL_COMMUNITY): Payer: Self-pay | Admitting: Neurology

## 2016-06-19 DIAGNOSIS — Y9389 Activity, other specified: Secondary | ICD-10-CM | POA: Insufficient documentation

## 2016-06-19 DIAGNOSIS — F17210 Nicotine dependence, cigarettes, uncomplicated: Secondary | ICD-10-CM | POA: Insufficient documentation

## 2016-06-19 DIAGNOSIS — M79675 Pain in left toe(s): Secondary | ICD-10-CM

## 2016-06-19 DIAGNOSIS — Y929 Unspecified place or not applicable: Secondary | ICD-10-CM | POA: Diagnosis not present

## 2016-06-19 DIAGNOSIS — W228XXA Striking against or struck by other objects, initial encounter: Secondary | ICD-10-CM | POA: Diagnosis not present

## 2016-06-19 DIAGNOSIS — I252 Old myocardial infarction: Secondary | ICD-10-CM | POA: Insufficient documentation

## 2016-06-19 DIAGNOSIS — I10 Essential (primary) hypertension: Secondary | ICD-10-CM | POA: Insufficient documentation

## 2016-06-19 DIAGNOSIS — Y999 Unspecified external cause status: Secondary | ICD-10-CM | POA: Diagnosis not present

## 2016-06-19 NOTE — ED Provider Notes (Signed)
[facility] DEPT Provider Note   CSN: [id number] Arrival date & time: 06/19/16  [id number]  By signing my name below, I, Steven Delacruz, attest that this documentation has been prepared under the direction and in the presence of [facility], PA-C. Electronically Signed: Judithann Delacruz, ED Scribe. 06/19/16. 9:52 AM.   History   Chief Complaint Chief Complaint  Patient presents with  . Toe Pain    HPI Comments: Steven Delacruz is a 65 y.o. male who presents to the Emergency Department complaining of gradually worsening moderate pain and swelling to his left 5th toe s/p injury that occurred PTA this morning. He notes that the pain is worse with movement of the toe. He explains that he was making his bed when he struck his toe on his steel-toed boots. He denies that he fell during the injury or sustained any head injuries or LOC. No alleviating factors noted. He states that he has tried 200 mg ibuprofen with no relief. He has an allergy to Lisinopril. He denies any fever, nausea, vomiting, numbness/tingling to toes, open wounds, or any other symptoms.   The history is provided by the patient. No language interpreter was used.    Past Medical History:  Diagnosis Date  . Abdominal aortic atherosclerosis ([place]) 05/13/2013  . Abnormal CT scan, pancreas - ? focal pancreatitis  05/13/2013  . Chronic back pain   . Headache(784.0)   . Hypertension   . Myocardial infarction   . Right shoulder pain     Patient Active Problem List   Diagnosis Date Noted  . Needs flu shot 05/30/2016  . Pain in joint, shoulder region, right 07/17/2015  . Essential hypertension, benign 12/12/2014  . Annual physical exam 12/12/2014  . Shortness of breath   . Thoracic aortic aneurysm ([place])   . Pancreatitis 07/11/2014  . Chest pain 07/11/2014  . Accelerated hypertension 07/11/2014  . Back pain at L4-L5 level 03/22/2014  . Pain in joint, shoulder region 03/22/2014  . Essential hypertension 12/21/2013  .  Drug-induced erectile dysfunction 12/21/2013  . Colon cancer screening 12/21/2013  . Abnormal CT scan, pancreas - ? focal pancreatitis  05/13/2013  . Acute pancreatitis 05/13/2013  . Chest pain, unspecified 05/13/2013  . Abdominal aortic atherosclerosis ([place]) 05/13/2013  . Prediabetes 03/18/2013  . Hypertriglyceridemia 03/18/2013  . Chronic back pain   . Right shoulder pain   . DIVERTICULOSIS, COLON 07/10/2006  . COLONIC POLYPS, HX OF 07/10/2006  . TOBACCO ABUSE 05/08/2006  . HYPERTENSION 05/08/2006  . OSTEOARTHRITIS 05/08/2006  . LOW BACK PAIN 05/08/2006    Past Surgical History:  Procedure Laterality Date  . NO PAST SURGERIES         Home Medications    Prior to Admission medications   Medication Sig Start Date End Date Taking? Authorizing Provider  acetaminophen-codeine (TYLENOL #3) 300-30 MG tablet Take 1 tablet by mouth every 8 (eight) hours as needed. 05/30/16   Tresa Garter, MD  amLODipine (NORVASC) 10 MG tablet Take 1 tablet (10 mg total) by mouth daily. 05/30/16   Tresa Garter, MD  atenolol (TENORMIN) 100 MG tablet Take 1 tablet (100 mg total) by mouth daily. 05/30/16   Tresa Garter, MD  hydrochlorothiazide (HYDRODIURIL) 25 MG tablet Take 1 tablet (25 mg total) by mouth daily. 05/30/16   Tresa Garter, MD  potassium chloride SA (K-DUR,KLOR-CON) 20 MEQ tablet Take 1 tablet (20 mEq total) by mouth daily. 05/30/16   Tresa Garter, MD    Family History No  family history on file.  Social History Social History  Substance Use Topics  . Smoking status: Current Every Day Smoker    Packs/day: 0.25    Years: 45.00    Types: Cigarettes  . Smokeless tobacco: Never Used     Comment: chew sticks  . Alcohol use No     Comment: quit drinking last weekend     Allergies   Lisinopril   Review of Systems Review of Systems  Constitutional: Negative for chills and fever.  Gastrointestinal: Negative for nausea and vomiting.    Musculoskeletal: Positive for arthralgias and joint swelling.  Skin: Negative for wound.  Neurological: Negative for numbness.     Physical Exam Updated Vital Signs BP 142/85 (BP Location: Right Arm)   Pulse 64   Temp 97.9 F (36.6 C) (Oral)   Resp 18   Ht 6' (1.829 m)   Wt 98.9 kg   SpO2 100%   BMI 29.57 kg/m   Physical Exam  Constitutional: He is oriented to person, place, and time. He appears well-developed and well-nourished. No distress.  HENT:  Head: Normocephalic and atraumatic.  Cardiovascular: Normal rate, regular rhythm and normal heart sounds.   No murmur heard. Pulmonary/Chest: Effort normal and breath sounds normal. No respiratory distress.  Musculoskeletal: He exhibits no edema.  TTP of the left 5th toe with minimal swelling, no open wounds; full ROM 2+ DP and sensation intact   Neurological: He is alert and oriented to person, place, and time.  Skin: Skin is warm and dry.  Psychiatric: He has a normal mood and affect.  Nursing note and vitals reviewed.    ED Treatments / Results  DIAGNOSTIC STUDIES: Oxygen Saturation is 100% on RA, normal by my interpretation.    COORDINATION OF CARE: 9:45 AM- Pt advised of plan for treatment and pt agrees. Pt will receive 5th toe x-ray for further evaluation.    Labs (all labs ordered are listed, but only abnormal results are displayed) Labs Reviewed - No data to display  EKG  EKG Interpretation None       Radiology Dg Toe 5th Left  Result Date: 06/19/2016 CLINICAL DATA:  Pain following injury EXAM: DG TOE 5TH LEFT:  3 V COMPARISON:  None. FINDINGS: Frontal, oblique, and lateral views were obtained. There is a comminuted obliquely oriented fracture through the junction of the proximal and mid portions of the fifth proximal phalanx with slight lateral angulation distally. No other fractures are evident. No dislocation. Joint spaces appear normal. No erosive change. IMPRESSION: Comminuted fracture fifth  proximal phalanx with slight angulation distally. No other fracture. No dislocation. No evident arthropathic change. Electronically Signed   By: Steven Delacruz M.D.   On: 06/19/2016 10:06    Procedures Procedures (including critical care time)  Medications Ordered in ED Medications - No data to display   Initial Impression / Assessment and Plan / ED Course  Pearlie Oyster, PA-C has reviewed the triage vital signs and the nursing notes.  Pertinent labs & imaging results that were available during my care of the patient were reviewed by me and considered in my medical decision making (see chart for details).  Clinical Course    Steven Delacruz is a 65 y.o. male who presents to ED for evaluation of left 5th toe pain after hitting it this morning. On exam, LLE is NVI. X-ray shows comminuted fracture. Buddy tape in ED. Post op shoe given. Patient sees podiatry regularly and will follow up with them. Return  precautions discussed and all questions answered.    Final Clinical Impressions(s) / ED Diagnoses   Final diagnoses:  Pain of toe of left foot    New Prescriptions New Prescriptions   No medications on file   I personally performed the services described in this documentation, which was scribed in my presence. The recorded information has been reviewed and is accurate.    [facility], PA-C 06/19/16 [place], MD 06/19/16 [phone]

## 2016-06-19 NOTE — ED Triage Notes (Addendum)
Pt reports he hit his left 5th toe on steel toed boots while making the bed. This morning it was bothering him and felt "flimsy" and wanted to have it checked.

## 2016-06-19 NOTE — Discharge Instructions (Signed)
Tylenol as needed for pain. Ice affected area for additional pain relief.  Follow up with your primary care provider or podiatrist if symptoms do not improve.  Return to ER for new or worsening symptoms, any additional concerns.

## 2016-07-03 ENCOUNTER — Ambulatory Visit (INDEPENDENT_AMBULATORY_CARE_PROVIDER_SITE_OTHER): Payer: Medicare Other | Admitting: Podiatry

## 2016-07-03 ENCOUNTER — Ambulatory Visit (INDEPENDENT_AMBULATORY_CARE_PROVIDER_SITE_OTHER): Payer: Medicare Other

## 2016-07-03 ENCOUNTER — Encounter: Payer: Self-pay | Admitting: Podiatry

## 2016-07-03 VITALS — BP 150/90 | HR 85

## 2016-07-03 DIAGNOSIS — S92515A Nondisplaced fracture of proximal phalanx of left lesser toe(s), initial encounter for closed fracture: Secondary | ICD-10-CM

## 2016-07-03 DIAGNOSIS — M79672 Pain in left foot: Secondary | ICD-10-CM

## 2016-07-03 DIAGNOSIS — R52 Pain, unspecified: Secondary | ICD-10-CM

## 2016-07-03 DIAGNOSIS — M7752 Other enthesopathy of left foot: Secondary | ICD-10-CM

## 2016-07-03 DIAGNOSIS — M109 Gout, unspecified: Secondary | ICD-10-CM | POA: Diagnosis not present

## 2016-07-03 MED ORDER — COLCHICINE 0.6 MG PO TABS: 0.6000 mg | ORAL_TABLET | Freq: Every day | ORAL | 0 refills | Status: DC

## 2016-07-04 ENCOUNTER — Telehealth: Payer: Self-pay | Admitting: *Deleted

## 2016-07-04 MED ORDER — COLCHICINE 0.6 MG PO TABS
0.6000 mg | ORAL_TABLET | Freq: Every day | ORAL | 0 refills | Status: DC
Start: 2016-07-04 — End: 2017-01-15

## 2016-07-04 NOTE — Telephone Encounter (Signed)
Pt left name DOB and phone number. 07/04/2016-I spoke with pt and he states Dr. Amalia Hailey was to call rx to [facility] on [address]. I told pt the rx had been ordered but not to the [facility] and I would change the pharmacy. Colchicine sent to [facility] on [address].

## 2016-07-08 MED ORDER — BETAMETHASONE SOD PHOS & ACET 6 (3-3) MG/ML IJ SUSP: 3.0000 mg | Freq: Once | INTRAMUSCULAR | Status: DC

## 2016-07-08 NOTE — Progress Notes (Signed)
Subjective:  Patient presents today as a new patient for evaluation of trauma to the fifth digit left foot. Patient states that just before Christmas he hit his fifth toe at the house. Patient went to the emergency department where he was diagnosed with a fifth toe proximal phalanx fracture which was closed, nondisplaced. Patient also states that last night he had an acute gout attack first MPJ left foot. Patient denies trauma to the first MPJ left foot. Patient states the pain came from nowhere and he is in a significant amount of pain currently upon presentation.    Objective/Physical Exam General: The patient is alert and oriented x3 in no acute distress.  Dermatology: Skin is warm, dry and supple bilateral lower extremities. Negative for open lesions or macerations.  Vascular: Palpable pedal pulses bilaterally. No edema or erythema noted. Capillary refill within normal limits.  Neurological: Epicritic and protective threshold grossly intact bilaterally.   Musculoskeletal Exam: Significant pain on palpation and range of motion of the first MPJ left foot. Moderate edema and erythema noted. Pain on palpation also noted to the fifth digit left foot. Range of motion within normal limits to all pedal and ankle joints bilateral. Muscle strength 5/5 in all groups bilateral.   Radiographic Exam:  Closed, nondisplaced fracture of the proximal phalanx fifth digit left foot.  Assessment: #1 fracture fifth digit left foot-closed, nondisplaced #2 acute gout attack first MPJ left foot #3 pain in left foot   Plan of Care:  #1 Patient was evaluated. #2 injection of 0.5 mL Celestone Soluspan injected in the first MPJ left foot #3 prescription for Colcrys0.6 mg #4 orders for uric acid levels placed #5 discussed buddy splinting to the patient in regards to the fifth digit fracture. Checked the patient to buddy splint the fifth digit to the fourth using Coban 1" daily #6 return to clinic in 4  weeks   Edrick Kins, DPM [facility]  Dr. Edrick Kins, [address]                                        [place], [place] [address]                Office [phone]  Fax [phone]

## 2016-07-22 DIAGNOSIS — M109 Gout, unspecified: Secondary | ICD-10-CM | POA: Diagnosis not present

## 2016-07-23 LAB — URIC ACID: Uric Acid, Serum: 7.3 mg/dL (ref 4.0–8.0)

## 2016-07-24 ENCOUNTER — Ambulatory Visit (INDEPENDENT_AMBULATORY_CARE_PROVIDER_SITE_OTHER): Payer: Medicare Other

## 2016-07-24 ENCOUNTER — Ambulatory Visit (INDEPENDENT_AMBULATORY_CARE_PROVIDER_SITE_OTHER): Payer: Medicare Other | Admitting: Podiatry

## 2016-07-24 DIAGNOSIS — M109 Gout, unspecified: Secondary | ICD-10-CM

## 2016-07-24 DIAGNOSIS — M7752 Other enthesopathy of left foot: Secondary | ICD-10-CM

## 2016-07-24 DIAGNOSIS — M79672 Pain in left foot: Secondary | ICD-10-CM

## 2016-07-24 DIAGNOSIS — S92515D Nondisplaced fracture of proximal phalanx of left lesser toe(s), subsequent encounter for fracture with routine healing: Secondary | ICD-10-CM

## 2016-07-24 MED ORDER — MELOXICAM 15 MG PO TABS: 15.0000 mg | ORAL_TABLET | Freq: Every day | ORAL | 1 refills | Status: AC

## 2016-07-24 MED ORDER — METHYLPREDNISOLONE 4 MG PO TBPK: ORAL_TABLET | ORAL | 0 refills | Status: DC

## 2016-08-01 NOTE — Progress Notes (Signed)
   Subjective:  Patient presents today for follow-up evaluation of gout to the first MPJ left foot. Also for follow-up evaluation of a fracture to the fifth digit left foot. Patient states that his fifth digit has improved greatly since last visit. He no longer experiences pain. Patient continues to complain of gout first MPJ left foot. He states that the injections did not help last visit.    Objective/Physical Exam General: The patient is alert and oriented x3 in no acute distress.  Dermatology: Skin is warm, dry and supple bilateral lower extremities. Negative for open lesions or macerations.  Vascular: Palpable pedal pulses bilaterally. No edema or erythema noted. Capillary refill within normal limits.  Neurological: Epicritic and protective threshold grossly intact bilaterally.   Musculoskeletal Exam: Range of motion within normal limits to all pedal and ankle joints bilateral. Muscle strength 5/5 in all groups bilateral.   Radiographic Exam:  Degenerative changes noted within the first MPJ left foot with subsequent joint space narrowing and spur formation about the joint. Closed, nondisplaced fracture of the fifth digit left foot noted with routine healing  Assessment: #1 acute gout attack first MPJ left foot #2 fracture fifth digit left foot-closed, nondisplaced, with routine healing    Plan of Care:  #1 Patient was evaluated. #2 today we reviewed the patient's last uric acid levels which were 7.4 mg/dL #3 prescription for Medrol Dosepak #4 prescription for meloxicam #5 today a postoperative shoe was dispensed #6 return to clinic in 2 weeks #7 we may need to discuss possible surgery next visit for first MPJ   Edrick Kins, DPM [facility]  Dr. Edrick Kins, [address]                                        [place], [place] [address]                Office [phone]  Fax [phone]

## 2016-08-07 ENCOUNTER — Ambulatory Visit (INDEPENDENT_AMBULATORY_CARE_PROVIDER_SITE_OTHER): Payer: Medicare Other

## 2016-08-07 ENCOUNTER — Encounter: Payer: Self-pay | Admitting: Podiatry

## 2016-08-07 ENCOUNTER — Ambulatory Visit (INDEPENDENT_AMBULATORY_CARE_PROVIDER_SITE_OTHER): Payer: Medicare Other | Admitting: Podiatry

## 2016-08-07 VITALS — BP 148/88 | HR 88 | Resp 16

## 2016-08-07 DIAGNOSIS — M109 Gout, unspecified: Secondary | ICD-10-CM

## 2016-08-07 DIAGNOSIS — S92515D Nondisplaced fracture of proximal phalanx of left lesser toe(s), subsequent encounter for fracture with routine healing: Secondary | ICD-10-CM | POA: Diagnosis not present

## 2016-08-18 NOTE — Progress Notes (Signed)
   Subjective:  Patient presents today for follow-up evaluation of an acute gout attack to the first MPJ left foot. Patient also has a fracture to the fifth digit left foot with routine healing. Patient states that he feels much better than last visit and he completed his Medrol Dosepak.    Objective/Physical Exam General: The patient is alert and oriented x3 in no acute distress.  Dermatology: Skin is warm, dry and supple bilateral lower extremities. Negative for open lesions or macerations.  Vascular: Palpable pedal pulses bilaterally. No edema or erythema noted. Capillary refill within normal limits.  Neurological: Epicritic and protective threshold grossly intact bilaterally.   Musculoskeletal Exam: Range of motion within normal limits to all pedal and ankle joints bilateral. Muscle strength 5/5 in all groups bilateral.  Negative for pain on palpation to the first MPJ left foot. Negative edema noted to the fifth digit left foot with negative pain on palpation.   Assessment: #1 acute gout attack first MPJ left foot-resolved #2 fracture fifth digit left foot-closed, nondisplaced, with routine healing    Plan of Care:  #1 Patient was evaluated. #2 today the patient is feeling much better. Continue postoperative shoe as needed. Continue meloxicam as. Needed. #3 return to clinic when necessary  Edrick Kins, DPM [facility]  Dr. Edrick Kins, [address]                                        [place], [place] [address]                Office [phone]  Fax [phone]

## 2016-10-02 MED FILL — HYDROCHLOROTHIAZIDE 25 MG T: 25 | 90 days supply | Qty: 90 | Fill #1

## 2016-10-02 MED FILL — AMLODIPINE BESYLATE 10 MG T: 10 | 90 days supply | Qty: 90 | Fill #1

## 2016-10-02 MED FILL — POTASSIUM CL ER 20 MEQ TAB: 20 | 30 days supply | Qty: 30 | Fill #1 | Status: TO

## 2016-11-06 ENCOUNTER — Ambulatory Visit: Payer: Medicare Other | Attending: Internal Medicine | Admitting: Internal Medicine

## 2016-11-06 ENCOUNTER — Encounter: Payer: Self-pay | Admitting: Internal Medicine

## 2016-11-06 VITALS — BP 138/78 | HR 85 | Temp 98.1°F | Resp 18 | Ht 72.0 in | Wt 218.0 lb

## 2016-11-06 DIAGNOSIS — E785 Hyperlipidemia, unspecified: Secondary | ICD-10-CM | POA: Insufficient documentation

## 2016-11-06 DIAGNOSIS — R7303 Prediabetes: Secondary | ICD-10-CM | POA: Insufficient documentation

## 2016-11-06 DIAGNOSIS — G8929 Other chronic pain: Secondary | ICD-10-CM | POA: Diagnosis not present

## 2016-11-06 DIAGNOSIS — F17200 Nicotine dependence, unspecified, uncomplicated: Secondary | ICD-10-CM | POA: Diagnosis not present

## 2016-11-06 DIAGNOSIS — M549 Dorsalgia, unspecified: Secondary | ICD-10-CM | POA: Insufficient documentation

## 2016-11-06 DIAGNOSIS — M25511 Pain in right shoulder: Secondary | ICD-10-CM | POA: Diagnosis not present

## 2016-11-06 DIAGNOSIS — I10 Essential (primary) hypertension: Secondary | ICD-10-CM | POA: Diagnosis not present

## 2016-11-06 DIAGNOSIS — F17210 Nicotine dependence, cigarettes, uncomplicated: Secondary | ICD-10-CM | POA: Insufficient documentation

## 2016-11-06 DIAGNOSIS — I252 Old myocardial infarction: Secondary | ICD-10-CM | POA: Insufficient documentation

## 2016-11-06 LAB — POCT GLYCOSYLATED HEMOGLOBIN (HGB A1C): Hemoglobin A1C: 5.6

## 2016-11-06 NOTE — Patient Instructions (Signed)
Steps to Quit Smoking Smoking tobacco can be bad for your health. It can also affect almost every organ in your body. Smoking puts you and people around you at risk for many serious long-lasting (chronic) diseases. Quitting smoking is hard, but it is one of the best things that you can do for your health. It is never too late to quit. What are the benefits of quitting smoking? When you quit smoking, you lower your risk for getting serious diseases and conditions. They can include:  Lung cancer or lung disease.  Heart disease.  Stroke.  Heart attack.  Not being able to have children (infertility).  Weak bones (osteoporosis) and broken bones (fractures). If you have coughing, wheezing, and shortness of breath, those symptoms may get better when you quit. You may also get sick less often. If you are pregnant, quitting smoking can help to lower your chances of having a baby of low birth weight. What can I do to help me quit smoking? Talk with your doctor about what can help you quit smoking. Some things you can do (strategies) include:  Quitting smoking totally, instead of slowly cutting back how much you smoke over a period of time.  Going to in-person counseling. You are more likely to quit if you go to many counseling sessions.  Using resources and support systems, such as:  Online chats with a counselor.  Phone quitlines.  Printed self-help materials.  Support groups or group counseling.  Text messaging programs.  Mobile phone apps or applications.  Taking medicines. Some of these medicines may have nicotine in them. If you are pregnant or breastfeeding, do not take any medicines to quit smoking unless your doctor says it is okay. Talk with your doctor about counseling or other things that can help you. Talk with your doctor about using more than one strategy at the same time, such as taking medicines while you are also going to in-person counseling. This can help make quitting  easier. What things can I do to make it easier to quit? Quitting smoking might feel very hard at first, but there is a lot that you can do to make it easier. Take these steps:  Talk to your family and friends. Ask them to support and encourage you.  Call phone quitlines, reach out to support groups, or work with a counselor.  Ask people who smoke to not smoke around you.  Avoid places that make you want (trigger) to smoke, such as:  Bars.  Parties.  Smoke-break areas at work.  Spend time with people who do not smoke.  Lower the stress in your life. Stress can make you want to smoke. Try these things to help your stress:  Getting regular exercise.  Deep-breathing exercises.  Yoga.  Meditating.  Doing a body scan. To do this, close your eyes, focus on one area of your body at a time from head to toe, and notice which parts of your body are tense. Try to relax the muscles in those areas.  Download or buy apps on your mobile phone or tablet that can help you stick to your quit plan. There are many free apps, such as QuitGuide from the CDC (Centers for Disease Control and Prevention). You can find more support from smokefree.gov and other websites. This information is not intended to replace advice given to you by your health care provider. Make sure you discuss any questions you have with your health care provider. Document Released: 04/13/2009 Document Revised: 02/13/2016 Document   Reviewed: 11/01/2014 Elsevier Interactive Patient Education  2017 [place]. Hypertension Hypertension, commonly called high blood pressure, is when the force of blood pumping through the arteries is too strong. The arteries are the blood vessels that carry blood from the heart throughout the body. Hypertension forces the heart to work harder to pump blood and may cause arteries to become narrow or stiff. Having untreated or uncontrolled hypertension can cause heart attacks, strokes, kidney disease,  and other problems. A blood pressure reading consists of a higher number over a lower number. Ideally, your blood pressure should be below 120/80. The first ("top") number is called the systolic pressure. It is a measure of the pressure in your arteries as your heart beats. The second ("bottom") number is called the diastolic pressure. It is a measure of the pressure in your arteries as the heart relaxes. What are the causes? The cause of this condition is not known. What increases the risk? Some risk factors for high blood pressure are under your control. Others are not. Factors you can change   Smoking.  Having type 2 diabetes mellitus, high cholesterol, or both.  Not getting enough exercise or physical activity.  Being overweight.  Having too much fat, sugar, calories, or salt (sodium) in your diet.  Drinking too much alcohol. Factors that are difficult or impossible to change   Having chronic kidney disease.  Having a family history of high blood pressure.  Age. Risk increases with age.  Race. You may be at higher risk if you are African-American.  Gender. Men are at higher risk than women before age 57. After age 53, women are at higher risk than men.  Having obstructive sleep apnea.  Stress. What are the signs or symptoms? Extremely high blood pressure (hypertensive crisis) may cause:  Headache.  Anxiety.  Shortness of breath.  Nosebleed.  Nausea and vomiting.  Severe chest pain.  Jerky movements you cannot control (seizures). How is this diagnosed? This condition is diagnosed by measuring your blood pressure while you are seated, with your arm resting on a surface. The cuff of the blood pressure monitor will be placed directly against the skin of your upper arm at the level of your heart. It should be measured at least twice using the same arm. Certain conditions can cause a difference in blood pressure between your right and left arms. Certain factors can  cause blood pressure readings to be lower or higher than normal (elevated) for a short period of time:  When your blood pressure is higher when you are in a health care provider's office than when you are at home, this is called white coat hypertension. Most people with this condition do not need medicines.  When your blood pressure is higher at home than when you are in a health care provider's office, this is called masked hypertension. Most people with this condition may need medicines to control blood pressure. If you have a high blood pressure reading during one visit or you have normal blood pressure with other risk factors:  You may be asked to return on a different day to have your blood pressure checked again.  You may be asked to monitor your blood pressure at home for 1 week or longer. If you are diagnosed with hypertension, you may have other blood or imaging tests to help your health care provider understand your overall risk for other conditions. How is this treated? This condition is treated by making healthy lifestyle changes, such as eating  healthy foods, exercising more, and reducing your alcohol intake. Your health care provider may prescribe medicine if lifestyle changes are not enough to get your blood pressure under control, and if:  Your systolic blood pressure is above 130.  Your diastolic blood pressure is above 80. Your personal target blood pressure may vary depending on your medical conditions, your age, and other factors. Follow these instructions at home: Eating and drinking   Eat a diet that is high in fiber and potassium, and low in sodium, added sugar, and fat. An example eating plan is called the DASH (Dietary Approaches to Stop Hypertension) diet. To eat this way:  Eat plenty of fresh fruits and vegetables. Try to fill half of your plate at each meal with fruits and vegetables.  Eat whole grains, such as whole wheat pasta, brown rice, or whole grain bread.  Fill about one quarter of your plate with whole grains.  Eat or drink low-fat dairy products, such as skim milk or low-fat yogurt.  Avoid fatty cuts of meat, processed or cured meats, and poultry with skin. Fill about one quarter of your plate with lean proteins, such as fish, chicken without skin, beans, eggs, and tofu.  Avoid premade and processed foods. These tend to be higher in sodium, added sugar, and fat.  Reduce your daily sodium intake. Most people with hypertension should eat less than 1,500 mg of sodium a day.  Limit alcohol intake to no more than 1 drink a day for nonpregnant women and 2 drinks a day for men. One drink equals 12 oz of beer, 5 oz of wine, or 1 oz of hard liquor. Lifestyle   Work with your health care provider to maintain a healthy body weight or to lose weight. Ask what an ideal weight is for you.  Get at least 30 minutes of exercise that causes your heart to beat faster (aerobic exercise) most days of the week. Activities may include walking, swimming, or biking.  Include exercise to strengthen your muscles (resistance exercise), such as pilates or lifting weights, as part of your weekly exercise routine. Try to do these types of exercises for 30 minutes at least 3 days a week.  Do not use any products that contain nicotine or tobacco, such as cigarettes and e-cigarettes. If you need help quitting, ask your health care provider.  Monitor your blood pressure at home as told by your health care provider.  Keep all follow-up visits as told by your health care provider. This is important. Medicines   Take over-the-counter and prescription medicines only as told by your health care provider. Follow directions carefully. Blood pressure medicines must be taken as prescribed.  Do not skip doses of blood pressure medicine. Doing this puts you at risk for problems and can make the medicine less effective.  Ask your health care provider about side effects or reactions  to medicines that you should watch for. Contact a health care provider if:  You think you are having a reaction to a medicine you are taking.  You have headaches that keep coming back (recurring).  You feel dizzy.  You have swelling in your ankles.  You have trouble with your vision. Get help right away if:  You develop a severe headache or confusion.  You have unusual weakness or numbness.  You feel faint.  You have severe pain in your chest or abdomen.  You vomit repeatedly.  You have trouble breathing. Summary  Hypertension is when the force of blood  pumping through your arteries is too strong. If this condition is not controlled, it may put you at risk for serious complications.  Your personal target blood pressure may vary depending on your medical conditions, your age, and other factors. For most people, a normal blood pressure is less than 120/80.  Hypertension is treated with lifestyle changes, medicines, or a combination of both. Lifestyle changes include weight loss, eating a healthy, low-sodium diet, exercising more, and limiting alcohol. This information is not intended to replace advice given to you by your health care provider. Make sure you discuss any questions you have with your health care provider. Document Released: 06/17/2005 Document Revised: 05/15/2016 Document Reviewed: 05/15/2016 Elsevier Interactive Patient Education  2017 [employer].

## 2016-11-06 NOTE — Progress Notes (Signed)
Steven Delacruz, is a 66 y.o. male  [id number]  [id number]  DOB - Mar 10, 1951  Chief Complaint  Patient presents with  . Hypertension      Subjective:   Michaiah Holsopple is a 66 y.o. male with history of hypertension, dyslipidemia, remote MI, chronic back pain and chronic right shoulder pain here today for a routine follow up visit. Patient has no complaint today, he is doing "just great", physically active. Actively trying to quit smoking, down to 1 cigarette a day. BP is controlled on current medications, patient is adherent with medications, reports no side effect. Was seen by podiatrist for his nail issues and he is happy with the result. No recent gout flare up. Up to date with his preventative care and immunization. Patient has No headache, No chest pain, No abdominal pain - No Nausea, No new weakness tingling or numbness, No Cough - SOB.  ALLERGIES: Allergies  Allergen Reactions  . Lisinopril Swelling    angioedema    PAST MEDICAL HISTORY: Past Medical History:  Diagnosis Date  . Abdominal aortic atherosclerosis ([place]) 05/13/2013  . Abnormal CT scan, pancreas - ? focal pancreatitis  05/13/2013  . Chronic back pain   . Headache(784.0)   . Hypertension   . Myocardial infarction ([place])   . Right shoulder pain     MEDICATIONS AT HOME: Prior to Admission medications   Medication Sig Start Date End Date Taking? Authorizing Provider  acetaminophen-codeine (TYLENOL #3) 300-30 MG tablet Take 1 tablet by mouth every 8 (eight) hours as needed. 05/30/16  Yes Danaysha Kirn E, MD  amLODipine (NORVASC) 10 MG tablet Take 1 tablet (10 mg total) by mouth daily. 05/30/16  Yes Tresa Garter, MD  atenolol (TENORMIN) 100 MG tablet Take 1 tablet (100 mg total) by mouth daily. 05/30/16  Yes Tresa Garter, MD  colchicine 0.6 MG tablet Take 1 tablet (0.6 mg total) by mouth daily. 07/04/16  Yes Edrick Kins, DPM  hydrochlorothiazide (HYDRODIURIL) 25 MG tablet Take 1  tablet (25 mg total) by mouth daily. 05/30/16  Yes Tresa Garter, MD  potassium chloride SA (K-DUR,KLOR-CON) 20 MEQ tablet Take 1 tablet (20 mEq total) by mouth daily. 05/30/16  Yes Tresa Garter, MD    Objective:   Vitals:   11/06/16 0849  BP: 138/78  Pulse: 85  Resp: 18  Temp: 98.1 F (36.7 C)  TempSrc: Oral  SpO2: 97%  Weight: 218 lb (98.9 kg)  Height: 6' (1.829 m)   Exam General appearance : Awake, alert, not in any distress. Speech Clear. Not toxic looking HEENT: Atraumatic and Normocephalic, pupils equally reactive to light and accomodation Neck: Supple, no JVD. No cervical lymphadenopathy.  Chest: Good air entry bilaterally, no added sounds  CVS: S1 S2 regular, no murmurs.  Abdomen: Bowel sounds present, Non tender and not distended with no gaurding, rigidity or rebound. Extremities: B/L Lower Ext shows no edema, both legs are warm to touch Neurology: Awake alert, and oriented X 3, CN II-XII intact, Non focal Skin: No Rash  Data Review Lab Results  Component Value Date   HGBA1C 5.6 11/16/2015   HGBA1C 6.0 07/17/2015   HGBA1C 5.80 12/12/2014    Assessment & Plan   1. Prediabetes  - POCT A1C  2. Essential hypertension  - Acute Hep Panel & Hep B Surface Ab - PSA - CMP14+EGFR  We have discussed target BP range and blood pressure goal. I have advised patient to check BP regularly and to call us  back or report to clinic if the numbers are consistently higher than 140/90. We discussed the importance of compliance with medical therapy and DASH diet recommended, consequences of uncontrolled hypertension discussed.  - continue current BP medications  3. Tobacco use disorder  Pinkney was counseled on the dangers of tobacco use, and was advised to quit. Reviewed strategies to maximize success, including removing cigarettes and smoking materials from environment, stress management and support of family/friends.  Patient have been counseled extensively  about nutrition and exercise. Other issues discussed during this visit include: low cholesterol diet, weight control and daily exercise, importance of adherence with medications and regular follow-up. We also discussed long term complications of uncontrolled hypertension.   Return in about 6 months (around 05/09/2017) for Follow up HTN, Routine Follow Up.  The patient was given clear instructions to go to ER or return to medical center if symptoms don't improve, worsen or new problems develop. The patient verbalized understanding. The patient was told to call to get lab results if they haven't heard anything in the next week.   This note has been created with Surveyor, quantity. Any transcriptional errors are unintentional.    Angelica Chessman, MD, [place], [place], [place], [place] and [place], [place]   11/06/2016, 9:30 AM

## 2016-11-06 NOTE — Progress Notes (Signed)
Patient is here HTN  Patient has taken medication today. Patient has eaten today.

## 2016-11-07 LAB — CMP14+EGFR
ALT: 29 IU/L (ref 0–44)
AST: 29 IU/L (ref 0–40)
Albumin/Globulin Ratio: 1.8 (ref 1.2–2.2)
Albumin: 4.3 g/dL (ref 3.6–4.8)
Alkaline Phosphatase: 103 IU/L (ref 39–117)
BUN/Creatinine Ratio: 10 (ref 10–24)
BUN: 10 mg/dL (ref 8–27)
Bilirubin Total: 0.5 mg/dL (ref 0.0–1.2)
CO2: 23 mmol/L (ref 18–29)
Calcium: 9.7 mg/dL (ref 8.6–10.2)
Chloride: 97 mmol/L (ref 96–106)
Creatinine, Ser: 1.05 mg/dL (ref 0.76–1.27)
GFR calc Af Amer: 86 mL/min/{1.73_m2} (ref >59)
GFR calc non Af Amer: 75 mL/min/{1.73_m2} (ref >59)
Globulin, Total: 2.4 g/dL (ref 1.5–4.5)
Glucose: 125 mg/dL — ABNORMAL HIGH (ref 65–99)
Potassium: 3.4 mmol/L — ABNORMAL LOW (ref 3.5–5.2)
Sodium: 143 mmol/L (ref 134–144)
Total Protein: 6.7 g/dL (ref 6.0–8.5)

## 2016-11-07 LAB — ACUTE HEP PANEL AND HEP B SURFACE AB
Hep A IgM: NEGATIVE
Hep B C IgM: NEGATIVE
Hep C Virus Ab: <0.1 s/co ratio (ref 0.0–0.9)
Hepatitis B Surf Ab Quant: 3.9 m[IU]/mL — ABNORMAL LOW (ref >9.9)
Hepatitis B Surface Ag: NEGATIVE

## 2016-11-07 LAB — PSA: Prostate Specific Ag, Serum: 0.6 ng/mL (ref 0.0–4.0)

## 2016-11-12 ENCOUNTER — Telehealth: Payer: Self-pay | Admitting: *Deleted

## 2016-11-12 NOTE — Telephone Encounter (Signed)
Patient verified DOB Patient is aware of lab results being mostly normal. Patient is aware of hepatitis screening being negative and prostate level being normal. Patient expressed his understanding and had no further questions.

## 2016-11-12 NOTE — Telephone Encounter (Signed)
-----   Message from Tresa Garter, MD sent at 11/11/2016  6:09 PM EDT ----- Please inform patient that lab results are mostly normal. Hepatitis panel Negative. Prostate enzyme: Normal

## 2016-12-09 ENCOUNTER — Telehealth: Payer: Self-pay | Admitting: Internal Medicine

## 2016-12-09 NOTE — Telephone Encounter (Signed)
[place] following up on request for a prescription back and knee braces. Request last faxed 12/04/16  Please follow up

## 2017-01-15 ENCOUNTER — Telehealth: Payer: Self-pay

## 2017-01-15 ENCOUNTER — Encounter: Payer: Self-pay | Admitting: Internal Medicine

## 2017-01-15 ENCOUNTER — Ambulatory Visit: Payer: Medicare Other | Attending: Internal Medicine | Admitting: Internal Medicine

## 2017-01-15 VITALS — BP 153/86 | Temp 98.2°F | Resp 18 | Ht 72.0 in | Wt 228.0 lb

## 2017-01-15 DIAGNOSIS — F17210 Nicotine dependence, cigarettes, uncomplicated: Secondary | ICD-10-CM | POA: Diagnosis not present

## 2017-01-15 DIAGNOSIS — S4991XA Unspecified injury of right shoulder and upper arm, initial encounter: Secondary | ICD-10-CM | POA: Diagnosis not present

## 2017-01-15 DIAGNOSIS — Z1211 Encounter for screening for malignant neoplasm of colon: Secondary | ICD-10-CM

## 2017-01-15 DIAGNOSIS — F17200 Nicotine dependence, unspecified, uncomplicated: Secondary | ICD-10-CM

## 2017-01-15 DIAGNOSIS — Z79899 Other long term (current) drug therapy: Secondary | ICD-10-CM | POA: Diagnosis not present

## 2017-01-15 DIAGNOSIS — I10 Essential (primary) hypertension: Secondary | ICD-10-CM | POA: Insufficient documentation

## 2017-01-15 DIAGNOSIS — I252 Old myocardial infarction: Secondary | ICD-10-CM | POA: Diagnosis not present

## 2017-01-15 DIAGNOSIS — M25511 Pain in right shoulder: Secondary | ICD-10-CM | POA: Insufficient documentation

## 2017-01-15 DIAGNOSIS — G8929 Other chronic pain: Secondary | ICD-10-CM | POA: Insufficient documentation

## 2017-01-15 DIAGNOSIS — E781 Pure hyperglyceridemia: Secondary | ICD-10-CM | POA: Insufficient documentation

## 2017-01-15 DIAGNOSIS — M545 Low back pain, unspecified: Secondary | ICD-10-CM

## 2017-01-15 DIAGNOSIS — X58XXXA Exposure to other specified factors, initial encounter: Secondary | ICD-10-CM | POA: Diagnosis not present

## 2017-01-15 MED ORDER — HYDROCHLOROTHIAZIDE 25 MG PO TABS: 25.0000 mg | ORAL_TABLET | Freq: Every day | ORAL | 3 refills | Status: DC

## 2017-01-15 MED ORDER — POTASSIUM CHLORIDE CRYS ER 20 MEQ PO TBCR: 20.0000 meq | EXTENDED_RELEASE_TABLET | Freq: Every day | ORAL | 3 refills | Status: DC

## 2017-01-15 MED ORDER — ACETAMINOPHEN-CODEINE #3 300-30 MG PO TABS: 1.0000 | ORAL_TABLET | Freq: Three times a day (TID) | ORAL | 0 refills | Status: DC | PRN

## 2017-01-15 MED ORDER — DICLOFENAC SODIUM 1 % TD GEL: 2.0000 g | Freq: Four times a day (QID) | TRANSDERMAL | 1 refills | Status: DC

## 2017-01-15 MED ORDER — COLCHICINE 0.6 MG PO TABS: 0.6000 mg | ORAL_TABLET | Freq: Every day | ORAL | 0 refills | Status: DC

## 2017-01-15 MED ORDER — AMLODIPINE BESYLATE 10 MG PO TABS: 10.0000 mg | ORAL_TABLET | Freq: Every day | ORAL | 3 refills | Status: DC

## 2017-01-15 MED ORDER — ATENOLOL 100 MG PO TABS: 100.0000 mg | ORAL_TABLET | Freq: Every day | ORAL | 3 refills | Status: DC

## 2017-01-15 MED FILL — AMLODIPINE BESYLATE 10 MG T: 10 | 30 days supply | Qty: 30 | Fill #0 | Status: TO

## 2017-01-15 MED FILL — ACETAMINOPHEN/COD #3 TABLET: 300-30 | 30 days supply | Qty: 90 | Fill #0

## 2017-01-15 MED FILL — POTASSIUM CL ER 20 MEQ TAB: 20 | 30 days supply | Qty: 30 | Fill #0

## 2017-01-15 MED FILL — COLCHICINE 0.6 MG TABLET: 0.6 | 6 days supply | Qty: 6 | Fill #0

## 2017-01-15 MED FILL — HYDROCHLOROTHIAZIDE 25 MG T: 25 | 30 days supply | Qty: 30 | Fill #0 | Status: TO

## 2017-01-15 NOTE — Telephone Encounter (Signed)
Katie called from [facility] and wanted to proivde correct fax number to fax patient rxs [phone].

## 2017-01-15 NOTE — Progress Notes (Signed)
Steven Delacruz, is a 66 y.o. male  [id number]  [id number]  DOB - 1951/01/29  Chief Complaint  Patient presents with  . paperwork     Subjective:   Steven Delacruz is a 66 y.o. male withhistory of hypertension, dyslipidemia, remote MI, chronic back pain and chronic right shoulder pain here today for a routine follow up visit and medication follow up. Patient is requesting a prescription for back and knee braces to support his chronic back and knee pains from osteoarthritis. His major complaint today is right shoulder pain from a recent injury. He is not able to hold object for long with his right hand and not able to reach above his head. No swelling or redness. No fever. No chest pain. Patient is still actively trying to quit smoking, smokes only 1 cigarette a day. BP is controlled on current medications, patient is adherent with medications, reports no side effect. Patient has No headache, No abdominal pain - No Nausea, No new weakness tingling or numbness, No Cough - SOB. Patient is due for colonoscopy.  No problems updated.  ALLERGIES: Allergies  Allergen Reactions  . Lisinopril Swelling    angioedema    PAST MEDICAL HISTORY: Past Medical History:  Diagnosis Date  . Abdominal aortic atherosclerosis ([place]) 05/13/2013  . Abnormal CT scan, pancreas - ? focal pancreatitis  05/13/2013  . Chronic back pain   . Headache(784.0)   . Hypertension   . Myocardial infarction ([place])   . Right shoulder pain     MEDICATIONS AT HOME: Prior to Admission medications   Medication Sig Start Date End Date Taking? Authorizing Provider  acetaminophen-codeine (TYLENOL #3) 300-30 MG tablet Take 1 tablet by mouth every 8 (eight) hours as needed. 01/15/17  Yes Tresa Garter, MD  amLODipine (NORVASC) 10 MG tablet Take 1 tablet (10 mg total) by mouth daily. 01/15/17  Yes Tresa Garter, MD  atenolol (TENORMIN) 100 MG tablet Take 1 tablet (100 mg total) by mouth daily. 01/15/17  Yes  Tresa Garter, MD  colchicine 0.6 MG tablet Take 1 tablet (0.6 mg total) by mouth daily. 01/15/17  Yes Tresa Garter, MD  diclofenac sodium (VOLTAREN) 1 % GEL Apply 2 g topically 4 (four) times daily. 01/15/17  Yes Tresa Garter, MD  hydrochlorothiazide (HYDRODIURIL) 25 MG tablet Take 1 tablet (25 mg total) by mouth daily. 01/15/17  Yes Tresa Garter, MD  potassium chloride SA (K-DUR,KLOR-CON) 20 MEQ tablet Take 1 tablet (20 mEq total) by mouth daily. 01/15/17  Yes Tresa Garter, MD    Objective:   Vitals:   01/15/17 0902  BP: (!) 153/86  Resp: 18  Temp: 98.2 F (36.8 C)  TempSrc: Oral  Weight: 228 lb (103.4 kg)  Height: 6' (1.829 m)   Exam General appearance : Awake, alert, not in any distress. Speech Clear. Not toxic looking, obese HEENT: Atraumatic and Normocephalic, pupils equally reactive to light and accomodation Neck: Supple, no JVD. No cervical lymphadenopathy.  Chest: Good air entry bilaterally, no added sounds  CVS: S1 S2 regular, no murmurs.  Abdomen: Bowel sounds present, Non tender and not distended with no gaurding, rigidity or rebound. Extremities: Limited Range of motion on right shoulder joint due to pain. B/L Lower Ext shows no edema, both legs are warm to touch Neurology: Awake alert, and oriented X 3, CN II-XII intact, Non focal Skin: No Rash  Data Review Lab Results  Component Value Date   HGBA1C 5.6 11/06/2016   HGBA1C 5.6  11/16/2015   HGBA1C 6.0 07/17/2015    Assessment & Plan   1. Essential hypertension: Repeat BP is 138/82 mm Hg Refill - amLODipine (NORVASC) 10 MG tablet; Take 1 tablet (10 mg total) by mouth daily.  Dispense: 90 tablet; Refill: 3 - atenolol (TENORMIN) 100 MG tablet; Take 1 tablet (100 mg total) by mouth daily.  Dispense: 90 tablet; Refill: 3 - hydrochlorothiazide (HYDRODIURIL) 25 MG tablet; Take 1 tablet (25 mg total) by mouth daily.  Dispense: 90 tablet; Refill: 3 - potassium chloride SA  (K-DUR,KLOR-CON) 20 MEQ tablet; Take 1 tablet (20 mEq total) by mouth daily.  Dispense: 30 tablet; Refill: 3  2. Tobacco use disorder   Jamoni was counseled on the dangers of tobacco use, and was advised to quit. Reviewed strategies to maximize success, including removing cigarettes and smoking materials from environment, stress management and support of family/friends.  Patient plan to Quit before the next appointment in November  3. Back pain at L4-L5 level  - acetaminophen-codeine (TYLENOL #3) 300-30 MG tablet; Take 1 tablet by mouth every 8 (eight) hours as needed.  Dispense: 90 tablet; Refill: 0 - diclofenac sodium (VOLTAREN) 1 % GEL; Apply 2 g topically 4 (four) times daily.  Dispense: 1 Tube; Refill: 1  4. Hypertriglyceridemia  To address this please limit saturated fat to no more than 7% of your calories, limit cholesterol to 200 mg/day, increase fiber and exercise as tolerated. If needed we may add another cholesterol lowering medication to your regimen.   5. Colon cancer screening  - Ambulatory referral to Gastroenterology  6. Injury of right shoulder, initial encounter  - acetaminophen-codeine (TYLENOL #3) 300-30 MG tablet; Take 1 tablet by mouth every 8 (eight) hours as needed.  Dispense: 90 tablet; Refill: 0 - diclofenac sodium (VOLTAREN) 1 % GEL; Apply 2 g topically 4 (four) times daily.  Dispense: 1 Tube; Refill: 1  - DG Shoulder Right; Future  Patient have been counseled extensively about nutrition and exercise. Other issues discussed during this visit include: low cholesterol diet, weight control and daily exercise, importance of adherence with medications and regular follow-up. We also discussed long term complications of uncontrolled hypertension.   Return in about 4 months (around 05/18/2017) for Follow up HTN, Follow up Pain and comorbidities.  The patient was given clear instructions to go to ER or return to medical center if symptoms don't improve, worsen or  new problems develop. The patient verbalized understanding. The patient was told to call to get lab results if they haven't heard anything in the next week.   This note has been created with Surveyor, quantity. Any transcriptional errors are unintentional.    Angelica Chessman, MD, [place], [place], [place] and [facility], [place]   01/15/2017, 10:24 AM

## 2017-01-15 NOTE — Patient Instructions (Signed)
DASH Eating Plan DASH stands for "Dietary Approaches to Stop Hypertension." The DASH eating plan is a healthy eating plan that has been shown to reduce high blood pressure (hypertension). It may also reduce your risk for type 2 diabetes, heart disease, and stroke. The DASH eating plan may also help with weight loss. What are tips for following this plan? General guidelines  Avoid eating more than 2,300 mg (milligrams) of salt (sodium) a day. If you have hypertension, you may need to reduce your sodium intake to 1,500 mg a day.  Limit alcohol intake to no more than 1 drink a day for nonpregnant women and 2 drinks a day for men. One drink equals 12 oz of beer, 5 oz of wine, or 1 oz of hard liquor.  Work with your health care provider to maintain a healthy body weight or to lose weight. Ask what an ideal weight is for you.  Get at least 30 minutes of exercise that causes your heart to beat faster (aerobic exercise) most days of the week. Activities may include walking, swimming, or biking.  Work with your health care provider or diet and nutrition specialist (dietitian) to adjust your eating plan to your individual calorie needs. Reading food labels  Check food labels for the amount of sodium per serving. Choose foods with less than 5 percent of the Daily Value of sodium. Generally, foods with less than 300 mg of sodium per serving fit into this eating plan.  To find whole grains, look for the word "whole" as the first word in the ingredient list. Shopping  Buy products labeled as "low-sodium" or "no salt added."  Buy fresh foods. Avoid canned foods and premade or frozen meals. Cooking  Avoid adding salt when cooking. Use salt-free seasonings or herbs instead of table salt or sea salt. Check with your health care provider or pharmacist before using salt substitutes.  Do not fry foods. Cook foods using healthy methods such as baking, boiling, grilling, and broiling instead.  Cook with  heart-healthy oils, such as olive, canola, soybean, or sunflower oil. Meal planning   Eat a balanced diet that includes: ? 5 or more servings of fruits and vegetables each day. At each meal, try to fill half of your plate with fruits and vegetables. ? Up to 6-8 servings of whole grains each day. ? Less than 6 oz of lean meat, poultry, or fish each day. A 3-oz serving of meat is about the same size as a deck of cards. One egg equals 1 oz. ? 2 servings of low-fat dairy each day. ? A serving of nuts, seeds, or beans 5 times each week. ? Heart-healthy fats. Healthy fats called Omega-3 fatty acids are found in foods such as flaxseeds and coldwater fish, like sardines, salmon, and mackerel.  Limit how much you eat of the following: ? Canned or prepackaged foods. ? Food that is high in trans fat, such as fried foods. ? Food that is high in saturated fat, such as fatty meat. ? Sweets, desserts, sugary drinks, and other foods with added sugar. ? Full-fat dairy products.  Do not salt foods before eating.  Try to eat at least 2 vegetarian meals each week.  Eat more home-cooked food and less restaurant, buffet, and fast food.  When eating at a restaurant, ask that your food be prepared with less salt or no salt, if possible. What foods are recommended? The items listed may not be a complete list. Talk with your dietitian about what   dietary choices are best for you. Grains Whole-grain or whole-wheat bread. Whole-grain or whole-wheat pasta. Brown rice. Oatmeal. Quinoa. Bulgur. Whole-grain and low-sodium cereals. Pita bread. Low-fat, low-sodium crackers. Whole-wheat flour tortillas. Vegetables Fresh or frozen vegetables (raw, steamed, roasted, or grilled). Low-sodium or reduced-sodium tomato and vegetable juice. Low-sodium or reduced-sodium tomato sauce and tomato paste. Low-sodium or reduced-sodium canned vegetables. Fruits All fresh, dried, or frozen fruit. Canned fruit in natural juice (without  added sugar). Meat and other protein foods Skinless chicken or turkey. Ground chicken or turkey. Pork with fat trimmed off. Fish and seafood. Egg whites. Dried beans, peas, or lentils. Unsalted nuts, nut butters, and seeds. Unsalted canned beans. Lean cuts of beef with fat trimmed off. Low-sodium, lean deli meat. Dairy Low-fat (1%) or fat-free (skim) milk. Fat-free, low-fat, or reduced-fat cheeses. Nonfat, low-sodium ricotta or cottage cheese. Low-fat or nonfat yogurt. Low-fat, low-sodium cheese. Fats and oils Soft margarine without trans fats. Vegetable oil. Low-fat, reduced-fat, or light mayonnaise and salad dressings (reduced-sodium). Canola, safflower, olive, soybean, and sunflower oils. Avocado. Seasoning and other foods Herbs. Spices. Seasoning mixes without salt. Unsalted popcorn and pretzels. Fat-free sweets. What foods are not recommended? The items listed may not be a complete list. Talk with your dietitian about what dietary choices are best for you. Grains Baked goods made with fat, such as croissants, muffins, or some breads. Dry pasta or rice meal packs. Vegetables Creamed or fried vegetables. Vegetables in a cheese sauce. Regular canned vegetables (not low-sodium or reduced-sodium). Regular canned tomato sauce and paste (not low-sodium or reduced-sodium). Regular tomato and vegetable juice (not low-sodium or reduced-sodium). Pickles. Olives. Fruits Canned fruit in a light or heavy syrup. Fried fruit. Fruit in cream or butter sauce. Meat and other protein foods Fatty cuts of meat. Ribs. Fried meat. Bacon. Sausage. Bologna and other processed lunch meats. Salami. Fatback. Hotdogs. Bratwurst. Salted nuts and seeds. Canned beans with added salt. Canned or smoked fish. Whole eggs or egg yolks. Chicken or turkey with skin. Dairy Whole or 2% milk, cream, and half-and-half. Whole or full-fat cream cheese. Whole-fat or sweetened yogurt. Full-fat cheese. Nondairy creamers. Whipped toppings.  Processed cheese and cheese spreads. Fats and oils Butter. Stick margarine. Lard. Shortening. Ghee. Bacon fat. Tropical oils, such as coconut, palm kernel, or palm oil. Seasoning and other foods Salted popcorn and pretzels. Onion salt, garlic salt, seasoned salt, table salt, and sea salt. Worcestershire sauce. Tartar sauce. Barbecue sauce. Teriyaki sauce. Soy sauce, including reduced-sodium. Steak sauce. Canned and packaged gravies. Fish sauce. Oyster sauce. Cocktail sauce. Horseradish that you find on the shelf. Ketchup. Mustard. Meat flavorings and tenderizers. Bouillon cubes. Hot sauce and Tabasco sauce. Premade or packaged marinades. Premade or packaged taco seasonings. Relishes. Regular salad dressings. Where to find more information:  National Heart, Lung, and Blood Institute: www.nhlbi.nih.gov  American Heart Association: www.heart.org Summary  The DASH eating plan is a healthy eating plan that has been shown to reduce high blood pressure (hypertension). It may also reduce your risk for type 2 diabetes, heart disease, and stroke.  With the DASH eating plan, you should limit salt (sodium) intake to 2,300 mg a day. If you have hypertension, you may need to reduce your sodium intake to 1,500 mg a day.  When on the DASH eating plan, aim to eat more fresh fruits and vegetables, whole grains, lean proteins, low-fat dairy, and heart-healthy fats.  Work with your health care provider or diet and nutrition specialist (dietitian) to adjust your eating plan to your individual   calorie needs. This information is not intended to replace advice given to you by your health care provider. Make sure you discuss any questions you have with your health care provider. Document Released: 06/06/2011 Document Revised: 06/10/2016 Document Reviewed: 06/10/2016 Elsevier Interactive Patient Education  2017 [employer] Hypertension Hypertension, commonly called high blood pressure, is when the force of blood  pumping through the arteries is too strong. The arteries are the blood vessels that carry blood from the heart throughout the body. Hypertension forces the heart to work harder to pump blood and may cause arteries to become narrow or stiff. Having untreated or uncontrolled hypertension can cause heart attacks, strokes, kidney disease, and other problems. A blood pressure reading consists of a higher number over a lower number. Ideally, your blood pressure should be below 120/80. The first ("top") number is called the systolic pressure. It is a measure of the pressure in your arteries as your heart beats. The second ("bottom") number is called the diastolic pressure. It is a measure of the pressure in your arteries as the heart relaxes. What are the causes? The cause of this condition is not known. What increases the risk? Some risk factors for high blood pressure are under your control. Others are not. Factors you can change  Smoking.  Having type 2 diabetes mellitus, high cholesterol, or both.  Not getting enough exercise or physical activity.  Being overweight.  Having too much fat, sugar, calories, or salt (sodium) in your diet.  Drinking too much alcohol. Factors that are difficult or impossible to change  Having chronic kidney disease.  Having a family history of high blood pressure.  Age. Risk increases with age.  Race. You may be at higher risk if you are African-American.  Gender. Men are at higher risk than women before age 45. After age 65, women are at higher risk than men.  Having obstructive sleep apnea.  Stress. What are the signs or symptoms? Extremely high blood pressure (hypertensive crisis) may cause:  Headache.  Anxiety.  Shortness of breath.  Nosebleed.  Nausea and vomiting.  Severe chest pain.  Jerky movements you cannot control (seizures).  How is this diagnosed? This condition is diagnosed by measuring your blood pressure while you are  seated, with your arm resting on a surface. The cuff of the blood pressure monitor will be placed directly against the skin of your upper arm at the level of your heart. It should be measured at least twice using the same arm. Certain conditions can cause a difference in blood pressure between your right and left arms. Certain factors can cause blood pressure readings to be lower or higher than normal (elevated) for a short period of time:  When your blood pressure is higher when you are in a health care provider's office than when you are at home, this is called white coat hypertension. Most people with this condition do not need medicines.  When your blood pressure is higher at home than when you are in a health care provider's office, this is called masked hypertension. Most people with this condition may need medicines to control blood pressure.  If you have a high blood pressure reading during one visit or you have normal blood pressure with other risk factors:  You may be asked to return on a different day to have your blood pressure checked again.  You may be asked to monitor your blood pressure at home for 1 week or longer.  If you are diagnosed   with hypertension, you may have other blood or imaging tests to help your health care provider understand your overall risk for other conditions. How is this treated? This condition is treated by making healthy lifestyle changes, such as eating healthy foods, exercising more, and reducing your alcohol intake. Your health care provider may prescribe medicine if lifestyle changes are not enough to get your blood pressure under control, and if:  Your systolic blood pressure is above 130.  Your diastolic blood pressure is above 80.  Your personal target blood pressure may vary depending on your medical conditions, your age, and other factors. Follow these instructions at home: Eating and drinking  Eat a diet that is high in fiber and potassium,  and low in sodium, added sugar, and fat. An example eating plan is called the DASH (Dietary Approaches to Stop Hypertension) diet. To eat this way: ? Eat plenty of fresh fruits and vegetables. Try to fill half of your plate at each meal with fruits and vegetables. ? Eat whole grains, such as whole wheat pasta, brown rice, or whole grain bread. Fill about one quarter of your plate with whole grains. ? Eat or drink low-fat dairy products, such as skim milk or low-fat yogurt. ? Avoid fatty cuts of meat, processed or cured meats, and poultry with skin. Fill about one quarter of your plate with lean proteins, such as fish, chicken without skin, beans, eggs, and tofu. ? Avoid premade and processed foods. These tend to be higher in sodium, added sugar, and fat.  Reduce your daily sodium intake. Most people with hypertension should eat less than 1,500 mg of sodium a day.  Limit alcohol intake to no more than 1 drink a day for nonpregnant women and 2 drinks a day for men. One drink equals 12 oz of beer, 5 oz of wine, or 1 oz of hard liquor. Lifestyle  Work with your health care provider to maintain a healthy body weight or to lose weight. Ask what an ideal weight is for you.  Get at least 30 minutes of exercise that causes your heart to beat faster (aerobic exercise) most days of the week. Activities may include walking, swimming, or biking.  Include exercise to strengthen your muscles (resistance exercise), such as pilates or lifting weights, as part of your weekly exercise routine. Try to do these types of exercises for 30 minutes at least 3 days a week.  Do not use any products that contain nicotine or tobacco, such as cigarettes and e-cigarettes. If you need help quitting, ask your health care provider.  Monitor your blood pressure at home as told by your health care provider.  Keep all follow-up visits as told by your health care provider. This is important. Medicines  Take over-the-counter and  prescription medicines only as told by your health care provider. Follow directions carefully. Blood pressure medicines must be taken as prescribed.  Do not skip doses of blood pressure medicine. Doing this puts you at risk for problems and can make the medicine less effective.  Ask your health care provider about side effects or reactions to medicines that you should watch for. Contact a health care provider if:  You think you are having a reaction to a medicine you are taking.  You have headaches that keep coming back (recurring).  You feel dizzy.  You have swelling in your ankles.  You have trouble with your vision. Get help right away if:  You develop a severe headache or confusion.  You   have unusual weakness or numbness.  You feel faint.  You have severe pain in your chest or abdomen.  You vomit repeatedly.  You have trouble breathing. Summary  Hypertension is when the force of blood pumping through your arteries is too strong. If this condition is not controlled, it may put you at risk for serious complications.  Your personal target blood pressure may vary depending on your medical conditions, your age, and other factors. For most people, a normal blood pressure is less than 120/80.  Hypertension is treated with lifestyle changes, medicines, or a combination of both. Lifestyle changes include weight loss, eating a healthy, low-sodium diet, exercising more, and limiting alcohol. This information is not intended to replace advice given to you by your health care provider. Make sure you discuss any questions you have with your health care provider. Document Released: 06/17/2005 Document Revised: 05/15/2016 Document Reviewed: 05/15/2016 Elsevier Interactive Patient Education  2018 [place] Choices to Lower Your Triglycerides Triglycerides are a type of fat in your blood. High levels of triglycerides can increase the risk of heart disease and stroke. If your  triglyceride levels are high, the foods you eat and your eating habits are very important. Choosing the right foods can help lower your triglycerides. What general guidelines do I need to follow?  Lose weight if you are overweight.  Limit or avoid alcohol.  Fill one half of your plate with vegetables and green salads.  Limit fruit to two servings a day. Choose fruit instead of juice.  Make one fourth of your plate whole grains. Look for the word "whole" as the first word in the ingredient list.  Fill one fourth of your plate with lean protein foods.  Enjoy fatty fish (such as salmon, mackerel, sardines, and tuna) three times a week.  Choose healthy fats.  Limit foods high in starch and sugar.  Eat more home-cooked food and less restaurant, buffet, and fast food.  Limit fried foods.  Cook foods using methods other than frying.  Limit saturated fats.  Check ingredient lists to avoid foods with partially hydrogenated oils (trans fats) in them. What foods can I eat? Grains Whole grains, such as whole wheat or whole grain breads, crackers, cereals, and pasta. Unsweetened oatmeal, bulgur, barley, quinoa, or brown rice. Corn or whole wheat flour tortillas. Vegetables Fresh or frozen vegetables (raw, steamed, roasted, or grilled). Green salads. Fruits All fresh, canned (in natural juice), or frozen fruits. Meat and Other Protein Products Ground beef (85% or leaner), grass-fed beef, or beef trimmed of fat. Skinless chicken or [place]. Ground chicken or [place]. Pork trimmed of fat. All fish and seafood. Eggs. Dried beans, peas, or lentils. Unsalted nuts or seeds. Unsalted canned or dry beans. Dairy Low-fat dairy products, such as skim or 1% milk, 2% or reduced-fat cheeses, low-fat ricotta or cottage cheese, or plain low-fat yogurt. Fats and Oils Tub margarines without trans fats. Light or reduced-fat mayonnaise and salad dressings. Avocado. Safflower, olive, or canola oils. Natural  peanut or almond butter. The items listed above may not be a complete list of recommended foods or beverages. Contact your dietitian for more options. What foods are not recommended? Grains White bread. White pasta. White rice. Cornbread. Bagels, pastries, and croissants. Crackers that contain trans fat. Vegetables White potatoes. Corn. Creamed or fried vegetables. Vegetables in a cheese sauce. Fruits Dried fruits. Canned fruit in light or heavy syrup. Fruit juice. Meat and Other Protein Products Fatty cuts of meat. Ribs, chicken wings, bacon,  sausage, bologna, salami, chitterlings, fatback, hot dogs, bratwurst, and packaged luncheon meats. Dairy Whole or 2% milk, cream, half-and-half, and cream cheese. Whole-fat or sweetened yogurt. Full-fat cheeses. Nondairy creamers and whipped toppings. Processed cheese, cheese spreads, or cheese curds. Sweets and Desserts Corn syrup, sugars, honey, and molasses. Candy. Jam and jelly. Syrup. Sweetened cereals. Cookies, pies, cakes, donuts, muffins, and ice cream. Fats and Oils Butter, stick margarine, lard, shortening, ghee, or bacon fat. Coconut, palm kernel, or palm oils. Beverages Alcohol. Sweetened drinks (such as sodas, lemonade, and fruit drinks or punches). The items listed above may not be a complete list of foods and beverages to avoid. Contact your dietitian for more information. This information is not intended to replace advice given to you by your health care provider. Make sure you discuss any questions you have with your health care provider. Document Released: 04/04/2004 Document Revised: 11/23/2015 Document Reviewed: 04/21/2013 Elsevier Interactive Patient Education  2017 [employer].

## 2017-01-20 DIAGNOSIS — M545 Low back pain: Secondary | ICD-10-CM | POA: Diagnosis not present

## 2017-01-20 DIAGNOSIS — M1712 Unilateral primary osteoarthritis, left knee: Secondary | ICD-10-CM | POA: Diagnosis not present

## 2017-01-21 ENCOUNTER — Ambulatory Visit (HOSPITAL_COMMUNITY)
Admission: RE | Admit: 2017-01-21 | Discharge: 2017-01-21 | Disposition: A | Discharge status: Home / Self Care | Payer: Medicare Other | Source: Ambulatory Visit | Attending: Internal Medicine | Admitting: Internal Medicine

## 2017-01-21 DIAGNOSIS — S4991XA Unspecified injury of right shoulder and upper arm, initial encounter: Secondary | ICD-10-CM | POA: Insufficient documentation

## 2017-01-21 DIAGNOSIS — M19011 Primary osteoarthritis, right shoulder: Secondary | ICD-10-CM | POA: Diagnosis not present

## 2017-01-21 DIAGNOSIS — M25511 Pain in right shoulder: Secondary | ICD-10-CM | POA: Diagnosis not present

## 2017-01-21 DIAGNOSIS — X58XXXA Exposure to other specified factors, initial encounter: Secondary | ICD-10-CM | POA: Insufficient documentation

## 2017-01-28 ENCOUNTER — Other Ambulatory Visit: Payer: Self-pay | Admitting: Internal Medicine

## 2017-01-28 DIAGNOSIS — G8929 Other chronic pain: Secondary | ICD-10-CM

## 2017-01-28 DIAGNOSIS — M25511 Pain in right shoulder: Principal | ICD-10-CM

## 2017-02-13 ENCOUNTER — Telehealth: Payer: Self-pay | Admitting: *Deleted

## 2017-02-13 NOTE — Telephone Encounter (Signed)
-----   Message from Tresa Garter, MD sent at 01/28/2017  4:09 PM EDT ----- Please inform patient that his right shoulder x ray showed mild osteoarthritis, but with the intensity of pain and limited range of motion, we will get an MRI to further evaluate the structures.

## 2017-02-13 NOTE — Telephone Encounter (Signed)
Patient verified DOB Patient is aware of xray showing mild osteoarthritis and an MRI needing to be completed for structure evaluation. Patient is aware of MRI being scheduled for 02/21/17 at 11:00. Patient had no further questions.

## 2017-02-21 ENCOUNTER — Ambulatory Visit (HOSPITAL_COMMUNITY)
Admission: RE | Admit: 2017-02-21 | Discharge: 2017-02-21 | Disposition: A | Discharge status: Home / Self Care | Payer: Medicare Other | Source: Ambulatory Visit | Attending: Internal Medicine | Admitting: Internal Medicine

## 2017-02-21 DIAGNOSIS — G8929 Other chronic pain: Secondary | ICD-10-CM | POA: Diagnosis present

## 2017-02-21 DIAGNOSIS — M75101 Unspecified rotator cuff tear or rupture of right shoulder, not specified as traumatic: Secondary | ICD-10-CM | POA: Diagnosis not present

## 2017-02-21 DIAGNOSIS — M25511 Pain in right shoulder: Secondary | ICD-10-CM | POA: Insufficient documentation

## 2017-02-25 ENCOUNTER — Other Ambulatory Visit: Payer: Self-pay | Admitting: Internal Medicine

## 2017-02-25 DIAGNOSIS — M75110 Incomplete rotator cuff tear or rupture of unspecified shoulder, not specified as traumatic: Secondary | ICD-10-CM

## 2017-02-28 ENCOUNTER — Telehealth: Payer: Self-pay | Admitting: *Deleted

## 2017-02-28 NOTE — Telephone Encounter (Signed)
Patient verified DOB Patient is aware of a tear being noted at the rotator cuff. Patient is aware of a referral being made to [facility] orthopedic. No further questions at this time.

## 2017-02-28 NOTE — Telephone Encounter (Signed)
-----   Message from Tresa Garter, MD sent at 02/25/2017 12:34 PM EDT ----- Please inform patient that his shoulder MRI showed rotator cuff tear. Will refer to orthopedic surgery. Ordered

## 2017-03-17 ENCOUNTER — Encounter (INDEPENDENT_AMBULATORY_CARE_PROVIDER_SITE_OTHER): Payer: Self-pay | Admitting: Orthopaedic Surgery

## 2017-03-17 ENCOUNTER — Ambulatory Visit (INDEPENDENT_AMBULATORY_CARE_PROVIDER_SITE_OTHER): Payer: Medicare Other | Admitting: Orthopaedic Surgery

## 2017-03-17 VITALS — BP 162/104 | HR 88 | Resp 20 | Ht 72.0 in | Wt 240.0 lb

## 2017-03-17 DIAGNOSIS — M25511 Pain in right shoulder: Secondary | ICD-10-CM

## 2017-03-17 DIAGNOSIS — G8929 Other chronic pain: Secondary | ICD-10-CM

## 2017-03-17 MED ORDER — LIDOCAINE HCL 1 % IJ SOLN
2.0000 mL | INTRAMUSCULAR | Status: AC | PRN
  Administered 2017-03-17: 2 mL

## 2017-03-17 MED ORDER — BUPIVACAINE HCL 0.5 % IJ SOLN
2.0000 mL | INTRAMUSCULAR | Status: AC | PRN
  Administered 2017-03-17: 2 mL via INTRA_ARTICULAR

## 2017-03-17 MED ORDER — METHYLPREDNISOLONE ACETATE 40 MG/ML IJ SUSP
80.0000 mg | INTRAMUSCULAR | Status: AC | PRN
  Administered 2017-03-17: 80 mg

## 2017-03-17 NOTE — Progress Notes (Signed)
Office Visit Note   Patient: Steven Delacruz           Date of Birth: 23-Aug-1950           MRN: [id number] Visit Date: 03/17/2017              Requested by: Steven Garter, MD [address] [place], [place] [address] PCP: Steven Garter, MD   Assessment & Plan: Visit Diagnoses:  1. Chronic right shoulder pain   Small tear of supraspinatus at its far lateral insertion of about 0.5 cm with some retraction  Plan: Long discussion regarding recent films and MRI scan consistent with rotator cuff tear .discussed different treatment options including cortisone injection and surgery. He  Would like to try the cortisone injection first  Follow-Up Instructions: Return in about 2 weeks (around 03/31/2017).   Orders:  No orders of the defined types were placed in this encounter.  No orders of the defined types were placed in this encounter.     Procedures: Large Joint Inj Date/Time: 03/17/2017 2:12 PM Performed by: Garald Balding Authorized by: Garald Balding   Consent Given by:  Patient Timeout: prior to procedure the correct patient, procedure, and site was verified   Indications:  Pain Location:  Shoulder Site:  L subacromial bursa Prep: patient was prepped and draped in usual sterile fashion   Needle Size:  25 G Needle Length:  1.5 inches Approach:  Lateral Ultrasound Guidance: No   Fluoroscopic Guidance: No   Arthrogram: No   Medications:  80 mg methylPREDNISolone acetate 40 MG/ML; 2 mL lidocaine 1 %; 2 mL bupivacaine 0.5 % Aspiration Attempted: No   Patient tolerance:  Patient tolerated the procedure well with no immediate complications     Clinical Data: No additional findings.   Subjective: Chief Complaint  Patient presents with  . Right Shoulder - Pain, Injury    Fell 5 months ago pain since   [name] fell landing on his right shoulder about 5 months ago. Injury occurred at home. He's had x-rays there were consistent with some  degenerative changes at the acromioclavicular joint. He's also had a follow-up MRI scan per his primary care physician and was performed last month. This revealed a 0.5 cm tear of the far lateral supraspinatus with about a centimeter of retraction. He notes that he continues to have pain to the point of compromise particularly with overhead activity and with sleep. Other than taking anti-inflammatory medicines she's not had any active treatment. He is not diabetic  HPI  Review of Systems   Objective: Vital Signs: BP (!) 162/104   Pulse 88   Resp 20   Ht 6' (1.829 m)   Wt 240 lb (108.9 kg)   BMI 32.55 kg/m   Physical Exam  Ortho Exam pain on extremes of internal and external rotation in the impingement position. Positive exam testing. Biceps intact. Skin intact. Prominence of the acromioclavicular joint with minimal discomfort. Pain over the anterior aspect of the shoulder subacromial region and very painful overhead arc of motion. Neurovascular exam intact. Denies any pain range of motion of cervical spine. Awake alert and oriented 3. No shortness of breath or chest pain. Denies any problem with left shoulder  Specialty Comments:  No specialty comments available.  Imaging: No results found.   PMFS History: Patient Active Problem List   Diagnosis Date Noted  . Needs flu shot 05/30/2016  . Pain in joint, shoulder region, right 07/17/2015  .  Essential hypertension, benign 12/12/2014  . Annual physical exam 12/12/2014  . Shortness of breath   . Thoracic aortic aneurysm ([place])   . Pancreatitis 07/11/2014  . Chest pain 07/11/2014  . Accelerated hypertension 07/11/2014  . Back pain at L4-L5 level 03/22/2014  . Pain in joint, shoulder region 03/22/2014  . Essential hypertension 12/21/2013  . Drug-induced erectile dysfunction 12/21/2013  . Colon cancer screening 12/21/2013  . Abnormal CT scan, pancreas - ? focal pancreatitis  05/13/2013  . Acute pancreatitis 05/13/2013  . Chest  pain, unspecified 05/13/2013  . Abdominal aortic atherosclerosis ([place]) 05/13/2013  . Prediabetes 03/18/2013  . Hypertriglyceridemia 03/18/2013  . Chronic back pain   . Right shoulder pain   . DIVERTICULOSIS, COLON 07/10/2006  . COLONIC POLYPS, HX OF 07/10/2006  . TOBACCO ABUSE 05/08/2006  . HYPERTENSION 05/08/2006  . OSTEOARTHRITIS 05/08/2006  . LOW BACK PAIN 05/08/2006   Past Medical History:  Diagnosis Date  . Abdominal aortic atherosclerosis ([place]) 05/13/2013  . Abnormal CT scan, pancreas - ? focal pancreatitis  05/13/2013  . Chronic back pain   . Headache(784.0)   . Hypertension   . Myocardial infarction ([place])   . Right shoulder pain     No family history on file.  Past Surgical History:  Procedure Laterality Date  . NO PAST SURGERIES     Social History   Occupational History  . Not on file.   Social History Main Topics  . Smoking status: Current Every Day Smoker    Packs/day: 0.25    Years: 45.00    Types: Cigarettes  . Smokeless tobacco: Never Used     Comment: chew sticks  . Alcohol use No     Comment: quit drinking last weekend  . Drug use: Yes    Types: Marijuana     Comment: occassional  . Sexual activity: Yes     Garald Balding, MD   Note - This record has been created using [employer].  Chart creation errors have been sought, but may not always  have been located. Such creation errors do not reflect on  the standard of medical care.

## 2017-03-17 NOTE — Progress Notes (Deleted)
   Office Visit Note   Patient: Steven Delacruz           Date of Birth: 10/08/1950           MRN: [id number] Visit Date: 03/17/2017              Requested by: Tresa Garter, MD [address] [place], [place] [address] PCP: Tresa Garter, MD   Assessment & Plan: Visit Diagnoses: No diagnosis found.  Plan: ***  Follow-Up Instructions: No Follow-up on file.   Orders:  No orders of the defined types were placed in this encounter.  No orders of the defined types were placed in this encounter.     Procedures: No procedures performed   Clinical Data: No additional findings.   Subjective: Chief Complaint  Patient presents with  . Right Shoulder - Pain, Injury    Fell 5 months ago pain since     HPI  Review of Systems  Musculoskeletal: Positive for back pain.       Right shoulder pain  All other systems reviewed and are negative.    Objective: Vital Signs: BP (!) 162/104   Pulse 88   Resp 20   Ht 6' (1.829 m)   Wt 240 lb (108.9 kg)   BMI 32.55 kg/m   Physical Exam  Ortho Exam  Specialty Comments:  No specialty comments available.  Imaging: No results found.   PMFS History: Patient Active Problem List   Diagnosis Date Noted  . Needs flu shot 05/30/2016  . Pain in joint, shoulder region, right 07/17/2015  . Essential hypertension, benign 12/12/2014  . Annual physical exam 12/12/2014  . Shortness of breath   . Thoracic aortic aneurysm ([place])   . Pancreatitis 07/11/2014  . Chest pain 07/11/2014  . Accelerated hypertension 07/11/2014  . Back pain at L4-L5 level 03/22/2014  . Pain in joint, shoulder region 03/22/2014  . Essential hypertension 12/21/2013  . Drug-induced erectile dysfunction 12/21/2013  . Colon cancer screening 12/21/2013  . Abnormal CT scan, pancreas - ? focal pancreatitis  05/13/2013  . Acute pancreatitis 05/13/2013  . Chest pain, unspecified 05/13/2013  . Abdominal aortic atherosclerosis ([place]) 05/13/2013  .  Prediabetes 03/18/2013  . Hypertriglyceridemia 03/18/2013  . Chronic back pain   . Right shoulder pain   . DIVERTICULOSIS, COLON 07/10/2006  . COLONIC POLYPS, HX OF 07/10/2006  . TOBACCO ABUSE 05/08/2006  . HYPERTENSION 05/08/2006  . OSTEOARTHRITIS 05/08/2006  . LOW BACK PAIN 05/08/2006   Past Medical History:  Diagnosis Date  . Abdominal aortic atherosclerosis ([place]) 05/13/2013  . Abnormal CT scan, pancreas - ? focal pancreatitis  05/13/2013  . Chronic back pain   . Headache(784.0)   . Hypertension   . Myocardial infarction ([place])   . Right shoulder pain     No family history on file.  Past Surgical History:  Procedure Laterality Date  . NO PAST SURGERIES     Social History   Occupational History  . Not on file.   Social History Main Topics  . Smoking status: Current Every Day Smoker    Packs/day: 0.25    Years: 45.00    Types: Cigarettes  . Smokeless tobacco: Never Used     Comment: chew sticks  . Alcohol use No     Comment: quit drinking last weekend  . Drug use: Yes    Types: Marijuana     Comment: occassional  . Sexual activity: Yes

## 2017-03-31 ENCOUNTER — Encounter (INDEPENDENT_AMBULATORY_CARE_PROVIDER_SITE_OTHER): Payer: Self-pay | Admitting: Orthopaedic Surgery

## 2017-03-31 ENCOUNTER — Ambulatory Visit (INDEPENDENT_AMBULATORY_CARE_PROVIDER_SITE_OTHER): Payer: Medicare Other | Admitting: Orthopaedic Surgery

## 2017-03-31 VITALS — BP 202/106 | HR 86 | Resp 18 | Ht 72.0 in | Wt 221.0 lb

## 2017-03-31 DIAGNOSIS — M25511 Pain in right shoulder: Secondary | ICD-10-CM

## 2017-03-31 DIAGNOSIS — G8929 Other chronic pain: Secondary | ICD-10-CM

## 2017-03-31 NOTE — Progress Notes (Signed)
Office Visit Note   Patient: Steven Delacruz           Date of Birth: 1950/08/22           MRN: [id number] Visit Date: 03/31/2017              Requested by: Tresa Garter, MD [address] [place], [place] [address] PCP: Tresa Garter, MD   Assessment & Plan: Visit Diagnoses:  1. Chronic right shoulder pain   rotator cuff tear involving supraspinatus associate with impingement and degenerative changes at the acromioclavicular joint  Plan: minimal response to subacromial cortisone injection.At this point I would suggest rotator cuff tear repair, distal clavicle resection and subacromial decompression. Long discussion regarding the surgery,incision, sling,immobilization postop. He would like to proceed  Follow-Up Instructions: Return will schedule surgery right shoulder.   Orders:  No orders of the defined types were placed in this encounter.  No orders of the defined types were placed in this encounter.     Procedures: No procedures performed   Clinical Data: No additional findings.   Subjective: Chief Complaint  Patient presents with  . Right Shoulder - Pain  Steven Delacruz has had an MRI scan demonstrating a small tear of the supraspinatus tendon. He had a minimal response to the subacromial cortisone injection.Now having difficulty sleeping and raising his arm over his he. No related numbness or tingling HPI  Review of Systems  Constitutional: Negative.   HENT: Negative.   Eyes: Negative.   Respiratory: Positive for shortness of breath.   Cardiovascular: Positive for leg swelling.  Gastrointestinal: Negative.   Endocrine: Negative.   Genitourinary: Negative.   Musculoskeletal: Positive for back pain.       Right foot   Skin: Negative.   Neurological: Negative.   Hematological: Negative.   Psychiatric/Behavioral: Positive for sleep disturbance.     Objective: Vital Signs: BP (!) 202/106   Pulse 86   Resp 18   Ht 6' (1.829 m)   Wt 221  lb (100.2 kg)   BMI 29.97 kg/m   Physical Exam  Ortho Examawake alert and oriented 3. Comfortable sitting. Painful overhead arc of motion associated with positive impingement and positive empty can testing. Skin intact. Neurovascular exam intact biceps intact. Mild pain at the acromioclavicular joint. Mild pain along the anterior aspect of the shoulder. Good strength  Specialty Comments:  No specialty comments available.  Imaging: No results found.   PMFS History: Patient Active Problem List   Diagnosis Date Noted  . Needs flu shot 05/30/2016  . Pain in joint, shoulder region, right 07/17/2015  . Essential hypertension, benign 12/12/2014  . Annual physical exam 12/12/2014  . Shortness of breath   . Thoracic aortic aneurysm ([place])   . Pancreatitis 07/11/2014  . Chest pain 07/11/2014  . Accelerated hypertension 07/11/2014  . Back pain at L4-L5 level 03/22/2014  . Pain in joint, shoulder region 03/22/2014  . Essential hypertension 12/21/2013  . Drug-induced erectile dysfunction 12/21/2013  . Colon cancer screening 12/21/2013  . Abnormal CT scan, pancreas - ? focal pancreatitis  05/13/2013  . Acute pancreatitis 05/13/2013  . Chest pain, unspecified 05/13/2013  . Abdominal aortic atherosclerosis ([place]) 05/13/2013  . Prediabetes 03/18/2013  . Hypertriglyceridemia 03/18/2013  . Chronic back pain   . Right shoulder pain   . DIVERTICULOSIS, COLON 07/10/2006  . COLONIC POLYPS, HX OF 07/10/2006  . TOBACCO ABUSE 05/08/2006  . HYPERTENSION 05/08/2006  . OSTEOARTHRITIS 05/08/2006  . LOW BACK PAIN 05/08/2006  Past Medical History:  Diagnosis Date  . Abdominal aortic atherosclerosis ([place]) 05/13/2013  . Abnormal CT scan, pancreas - ? focal pancreatitis  05/13/2013  . Chronic back pain   . Headache(784.0)   . Hypertension   . Myocardial infarction ([place])   . Right shoulder pain     No family history on file.  Past Surgical History:  Procedure Laterality Date  . NO PAST  SURGERIES     Social History   Occupational History  . Not on file.   Social History Main Topics  . Smoking status: Current Every Day Smoker    Packs/day: 0.25    Years: 45.00    Types: Cigarettes  . Smokeless tobacco: Never Used     Comment: chew sticks  . Alcohol use No     Comment: quit drinking last weekend  . Drug use: Yes    Types: Marijuana     Comment: occassional  . Sexual activity: Yes

## 2017-06-11 ENCOUNTER — Encounter: Payer: Self-pay | Admitting: Internal Medicine

## 2017-06-11 ENCOUNTER — Ambulatory Visit: Payer: Medicare Other | Attending: Internal Medicine | Admitting: Internal Medicine

## 2017-06-11 VITALS — BP 161/76 | HR 96 | Temp 98.2°F | Resp 18 | Ht 72.0 in | Wt 231.0 lb

## 2017-06-11 DIAGNOSIS — M25511 Pain in right shoulder: Secondary | ICD-10-CM | POA: Diagnosis not present

## 2017-06-11 DIAGNOSIS — I10 Essential (primary) hypertension: Secondary | ICD-10-CM | POA: Insufficient documentation

## 2017-06-11 DIAGNOSIS — M545 Low back pain, unspecified: Secondary | ICD-10-CM

## 2017-06-11 DIAGNOSIS — X58XXXD Exposure to other specified factors, subsequent encounter: Secondary | ICD-10-CM | POA: Insufficient documentation

## 2017-06-11 DIAGNOSIS — G8929 Other chronic pain: Secondary | ICD-10-CM | POA: Insufficient documentation

## 2017-06-11 DIAGNOSIS — F17200 Nicotine dependence, unspecified, uncomplicated: Secondary | ICD-10-CM

## 2017-06-11 DIAGNOSIS — E785 Hyperlipidemia, unspecified: Secondary | ICD-10-CM | POA: Diagnosis not present

## 2017-06-11 DIAGNOSIS — S4991XA Unspecified injury of right shoulder and upper arm, initial encounter: Secondary | ICD-10-CM | POA: Insufficient documentation

## 2017-06-11 DIAGNOSIS — Z23 Encounter for immunization: Secondary | ICD-10-CM

## 2017-06-11 DIAGNOSIS — S4991XD Unspecified injury of right shoulder and upper arm, subsequent encounter: Secondary | ICD-10-CM | POA: Insufficient documentation

## 2017-06-11 DIAGNOSIS — Z760 Encounter for issue of repeat prescription: Secondary | ICD-10-CM | POA: Insufficient documentation

## 2017-06-11 DIAGNOSIS — I252 Old myocardial infarction: Secondary | ICD-10-CM | POA: Diagnosis not present

## 2017-06-11 DIAGNOSIS — Z720 Tobacco use: Secondary | ICD-10-CM | POA: Diagnosis not present

## 2017-06-11 DIAGNOSIS — Z79899 Other long term (current) drug therapy: Secondary | ICD-10-CM | POA: Insufficient documentation

## 2017-06-11 MED ORDER — POTASSIUM CHLORIDE CRYS ER 20 MEQ PO TBCR: 20.0000 meq | EXTENDED_RELEASE_TABLET | Freq: Every day | ORAL | 3 refills | Status: DC

## 2017-06-11 MED ORDER — AMLODIPINE BESYLATE 10 MG PO TABS: 10.0000 mg | ORAL_TABLET | Freq: Every day | ORAL | 3 refills | Status: DC

## 2017-06-11 MED ORDER — ATENOLOL 100 MG PO TABS: 100.0000 mg | ORAL_TABLET | Freq: Every day | ORAL | 3 refills | Status: DC

## 2017-06-11 MED ORDER — HYDROCHLOROTHIAZIDE 25 MG PO TABS: 25.0000 mg | ORAL_TABLET | Freq: Every day | ORAL | 3 refills | Status: DC

## 2017-06-11 MED ORDER — ACETAMINOPHEN-CODEINE #3 300-30 MG PO TABS: 1.0000 | ORAL_TABLET | Freq: Three times a day (TID) | ORAL | 0 refills | Status: DC | PRN

## 2017-06-11 MED ORDER — COLCHICINE 0.6 MG PO TABS: 0.6000 mg | ORAL_TABLET | Freq: Every day | ORAL | 0 refills | Status: DC

## 2017-06-11 NOTE — Patient Instructions (Signed)
Steps to Quit Smoking Smoking tobacco can be bad for your health. It can also affect almost every organ in your body. Smoking puts you and people around you at risk for many serious long-lasting (chronic) diseases. Quitting smoking is hard, but it is one of the best things that you can do for your health. It is never too late to quit. What are the benefits of quitting smoking? When you quit smoking, you lower your risk for getting serious diseases and conditions. They can include:  Lung cancer or lung disease.  Heart disease.  Stroke.  Heart attack.  Not being able to have children (infertility).  Weak bones (osteoporosis) and broken bones (fractures).  If you have coughing, wheezing, and shortness of breath, those symptoms may get better when you quit. You may also get sick less often. If you are pregnant, quitting smoking can help to lower your chances of having a baby of low birth weight. What can I do to help me quit smoking? Talk with your doctor about what can help you quit smoking. Some things you can do (strategies) include:  Quitting smoking totally, instead of slowly cutting back how much you smoke over a period of time.  Going to in-person counseling. You are more likely to quit if you go to many counseling sessions.  Using resources and support systems, such as: ? Online chats with a counselor. ? Phone quitlines. ? Printed self-help materials. ? Support groups or group counseling. ? Text messaging programs. ? Mobile phone apps or applications.  Taking medicines. Some of these medicines may have nicotine in them. If you are pregnant or breastfeeding, do not take any medicines to quit smoking unless your doctor says it is okay. Talk with your doctor about counseling or other things that can help you.  Talk with your doctor about using more than one strategy at the same time, such as taking medicines while you are also going to in-person counseling. This can help make  quitting easier. What things can I do to make it easier to quit? Quitting smoking might feel very hard at first, but there is a lot that you can do to make it easier. Take these steps:  Talk to your family and friends. Ask them to support and encourage you.  Call phone quitlines, reach out to support groups, or work with a counselor.  Ask people who smoke to not smoke around you.  Avoid places that make you want (trigger) to smoke, such as: ? Bars. ? Parties. ? Smoke-break areas at work.  Spend time with people who do not smoke.  Lower the stress in your life. Stress can make you want to smoke. Try these things to help your stress: ? Getting regular exercise. ? Deep-breathing exercises. ? Yoga. ? Meditating. ? Doing a body scan. To do this, close your eyes, focus on one area of your body at a time from head to toe, and notice which parts of your body are tense. Try to relax the muscles in those areas.  Download or buy apps on your mobile phone or tablet that can help you stick to your quit plan. There are many free apps, such as QuitGuide from the CDC (Centers for Disease Control and Prevention). You can find more support from smokefree.gov and other websites.  This information is not intended to replace advice given to you by your health care provider. Make sure you discuss any questions you have with your health care provider. Document Released: 04/13/2009 Document   Revised: 02/13/2016 Document Reviewed: 11/01/2014 Elsevier Interactive Patient Education  2018 [employer]. Tobacco Use Disorder Tobacco use disorder (TUD) is a mental disorder. It is the long-term use of tobacco in spite of related health problems or difficulty with normal life activities. Tobacco is most commonly smoked as cigarettes and less commonly as cigars or pipes. Smokeless chewing tobacco and snuff are also popular. People with TUD get a feeling of extreme pleasure (euphoria) from using tobacco and have a desire  to use it again and again. Repeated use of tobacco can cause problems. The addictive effects of tobacco are due mainly tothe ingredient nicotine. Nicotine also causes a rush of adrenaline (epinephrine) in the body. This leads to increased blood pressure, heart rate, and breathing rate. These changes may cause problems for people with high blood pressure, weak hearts, or lung disease. High doses of nicotine in children and pets can lead to seizures and death. Tobacco contains a number of other unsafe chemicals. These chemicals are especially harmful when inhaled as smoke and can damage almost every organ in the body. Smokers live shorter lives than nonsmokers and are at risk of dying from a number of diseases and cancers. Tobacco smoke can also cause health problems for nonsmokers (due to inhaling secondhand smoke). Smoking is also a fire hazard. TUD usually starts in the late teenage years and is most common in young adults between the ages of 63 and 31 years. People who start smoking earlier in life are more likely to continue smoking as adults. TUD is somewhat more common in men than women. People with TUD are at higher risk for using alcohol and other drugs of abuse. What increases the risk? Risk factors for TUD include:  Having family members with the disorder.  Being around people who use tobacco.  Having an existing mental health issue such as schizophrenia, depression, bipolar disorder, ADHD, or posttraumatic stress disorder (PTSD).  What are the signs or symptoms? People with tobacco use disorder have two or more of the following signs and symptoms within 12 months:  Use of more tobacco over a longer period than intended.  Not able to cut down or control tobacco use.  A lot of time spent obtaining or using tobacco.  Strong desire or urge to use tobacco (craving). Cravings may last for 6 months or longer after quitting.  Use of tobacco even when use leads to major problems at work,  school, or home.  Use of tobacco even when use leads to relationship problems.  Giving up or cutting down on important life activities because of tobacco use.  Repeatedly using tobacco in situations where it puts you or others in physical danger, like smoking in bed.  Use of tobacco even when it is known that a physical or mental problem is likely related to tobacco use. ? Physical problems are numerous and may include chronic bronchitis, emphysema, lung and other cancers, gum disease, high blood pressure, heart disease, and stroke. ? Mental problems caused by tobacco may include difficulty sleeping and anxiety.  Need to use greater amounts of tobacco to get the same effect. This means you have developed a tolerance.  Withdrawal symptoms as a result of stopping or rapidly cutting back use. These symptoms may last a month or more after quitting and include the following: ? Depressed, anxious, or irritable mood. ? Difficulty concentrating. ? Increased appetite. ? Restlessness or trouble sleeping. ? Use of tobacco to avoid withdrawal symptoms.  How is this diagnosed? Tobacco use  disorder is diagnosed by your health care provider. A diagnosis may be made by:  Your health care provider asking questions about your tobacco use and any problems it may be causing.  A physical exam.  Lab tests.  You may be referred to a mental health professional or addiction specialist.  The severity of tobacco use disorder depends on the number of signs and symptoms you have:  Mild-Two or three symptoms.  Moderate-Four or five symptoms.  Severe-Six or more symptoms.  How is this treated? Many people with tobacco use disorder are unable to quit on their own and need help. Treatment options include the following:  Nicotine replacement therapy (NRT). NRT provides nicotine without the other harmful chemicals in tobacco. NRT gradually lowers the dosage of nicotine in the body and reduces withdrawal  symptoms. NRT is available in over-the-counter forms (gum, lozenges, and skin patches) as well as prescription forms (mouth inhaler and nasal spray).  Medicines.This may include: ? Antidepressant medicine that may reduce nicotine cravings. ? A medicine that acts on nicotine receptors in the brain to reduce cravings and withdrawal symptoms. It may also block the effects of tobacco in people with TUD who relapse.  Counseling or talk therapy. A form of talk therapy called behavioral therapy is commonly used to treat people with TUD. Behavioral therapy looks at triggers for tobacco use, how to avoid them, and how to cope with cravings. It is most effective in person or by phone but is also available in self-help forms (books and Internet websites).  Support groups. These provide emotional support, advice, and guidance for quitting tobacco.  The most effective treatment for TUD is usually a combination of medicine, talk therapy, and support groups. Follow these instructions at home:  Keep all follow-up visits as directed by your health care provider. This is important.  Take medicines only as directed by your health care provider.  Check with your health care provider before starting new prescription or over-the-counter medicines. Contact a health care provider if:  You are not able to take your medicines as prescribed.  Treatment is not helping your TUD and your symptoms get worse. Get help right away if:  You have serious thoughts about hurting yourself or others.  You have trouble breathing, chest pain, sudden weakness, or sudden numbness in part of your body. This information is not intended to replace advice given to you by your health care provider. Make sure you discuss any questions you have with your health care provider. Document Released: 02/21/2004 Document Revised: 02/18/2016 Document Reviewed: 08/13/2013 Elsevier Interactive Patient Education  2018 [employer]. Hypertension Hypertension, commonly called high blood pressure, is when the force of blood pumping through the arteries is too strong. The arteries are the blood vessels that carry blood from the heart throughout the body. Hypertension forces the heart to work harder to pump blood and may cause arteries to become narrow or stiff. Having untreated or uncontrolled hypertension can cause heart attacks, strokes, kidney disease, and other problems. A blood pressure reading consists of a higher number over a lower number. Ideally, your blood pressure should be below 120/80. The first ("top") number is called the systolic pressure. It is a measure of the pressure in your arteries as your heart beats. The second ("bottom") number is called the diastolic pressure. It is a measure of the pressure in your arteries as the heart relaxes. What are the causes? The cause of this condition is not known. What increases the risk? Some  risk factors for high blood pressure are under your control. Others are not. Factors you can change  Smoking.  Having type 2 diabetes mellitus, high cholesterol, or both.  Not getting enough exercise or physical activity.  Being overweight.  Having too much fat, sugar, calories, or salt (sodium) in your diet.  Drinking too much alcohol. Factors that are difficult or impossible to change  Having chronic kidney disease.  Having a family history of high blood pressure.  Age. Risk increases with age.  Race. You may be at higher risk if you are African-American.  Gender. Men are at higher risk than women before age 3. After age 98, women are at higher risk than men.  Having obstructive sleep apnea.  Stress. What are the signs or symptoms? Extremely high blood pressure (hypertensive crisis) may cause:  Headache.  Anxiety.  Shortness of breath.  Nosebleed.  Nausea and vomiting.  Severe chest pain.  Jerky movements you cannot control (seizures).  How is  this diagnosed? This condition is diagnosed by measuring your blood pressure while you are seated, with your arm resting on a surface. The cuff of the blood pressure monitor will be placed directly against the skin of your upper arm at the level of your heart. It should be measured at least twice using the same arm. Certain conditions can cause a difference in blood pressure between your right and left arms. Certain factors can cause blood pressure readings to be lower or higher than normal (elevated) for a short period of time:  When your blood pressure is higher when you are in a health care provider's office than when you are at home, this is called white coat hypertension. Most people with this condition do not need medicines.  When your blood pressure is higher at home than when you are in a health care provider's office, this is called masked hypertension. Most people with this condition may need medicines to control blood pressure.  If you have a high blood pressure reading during one visit or you have normal blood pressure with other risk factors:  You may be asked to return on a different day to have your blood pressure checked again.  You may be asked to monitor your blood pressure at home for 1 week or longer.  If you are diagnosed with hypertension, you may have other blood or imaging tests to help your health care provider understand your overall risk for other conditions. How is this treated? This condition is treated by making healthy lifestyle changes, such as eating healthy foods, exercising more, and reducing your alcohol intake. Your health care provider may prescribe medicine if lifestyle changes are not enough to get your blood pressure under control, and if:  Your systolic blood pressure is above 130.  Your diastolic blood pressure is above 80.  Your personal target blood pressure may vary depending on your medical conditions, your age, and other factors. Follow these  instructions at home: Eating and drinking  Eat a diet that is high in fiber and potassium, and low in sodium, added sugar, and fat. An example eating plan is called the DASH (Dietary Approaches to Stop Hypertension) diet. To eat this way: ? Eat plenty of fresh fruits and vegetables. Try to fill half of your plate at each meal with fruits and vegetables. ? Eat whole grains, such as whole wheat pasta, brown rice, or whole grain bread. Fill about one quarter of your plate with whole grains. ? Eat or drink low-fat  dairy products, such as skim milk or low-fat yogurt. ? Avoid fatty cuts of meat, processed or cured meats, and poultry with skin. Fill about one quarter of your plate with lean proteins, such as fish, chicken without skin, beans, eggs, and tofu. ? Avoid premade and processed foods. These tend to be higher in sodium, added sugar, and fat.  Reduce your daily sodium intake. Most people with hypertension should eat less than 1,500 mg of sodium a day.  Limit alcohol intake to no more than 1 drink a day for nonpregnant women and 2 drinks a day for men. One drink equals 12 oz of beer, 5 oz of wine, or 1 oz of hard liquor. Lifestyle  Work with your health care provider to maintain a healthy body weight or to lose weight. Ask what an ideal weight is for you.  Get at least 30 minutes of exercise that causes your heart to beat faster (aerobic exercise) most days of the week. Activities may include walking, swimming, or biking.  Include exercise to strengthen your muscles (resistance exercise), such as pilates or lifting weights, as part of your weekly exercise routine. Try to do these types of exercises for 30 minutes at least 3 days a week.  Do not use any products that contain nicotine or tobacco, such as cigarettes and e-cigarettes. If you need help quitting, ask your health care provider.  Monitor your blood pressure at home as told by your health care provider.  Keep all follow-up visits as  told by your health care provider. This is important. Medicines  Take over-the-counter and prescription medicines only as told by your health care provider. Follow directions carefully. Blood pressure medicines must be taken as prescribed.  Do not skip doses of blood pressure medicine. Doing this puts you at risk for problems and can make the medicine less effective.  Ask your health care provider about side effects or reactions to medicines that you should watch for. Contact a health care provider if:  You think you are having a reaction to a medicine you are taking.  You have headaches that keep coming back (recurring).  You feel dizzy.  You have swelling in your ankles.  You have trouble with your vision. Get help right away if:  You develop a severe headache or confusion.  You have unusual weakness or numbness.  You feel faint.  You have severe pain in your chest or abdomen.  You vomit repeatedly.  You have trouble breathing. Summary  Hypertension is when the force of blood pumping through your arteries is too strong. If this condition is not controlled, it may put you at risk for serious complications.  Your personal target blood pressure may vary depending on your medical conditions, your age, and other factors. For most people, a normal blood pressure is less than 120/80.  Hypertension is treated with lifestyle changes, medicines, or a combination of both. Lifestyle changes include weight loss, eating a healthy, low-sodium diet, exercising more, and limiting alcohol. This information is not intended to replace advice given to you by your health care provider. Make sure you discuss any questions you have with your health care provider. Document Released: 06/17/2005 Document Revised: 05/15/2016 Document Reviewed: 05/15/2016 Elsevier Interactive Patient Education  [employer].
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
## 2017-09-26 ENCOUNTER — Telehealth: Payer: Self-pay | Admitting: Internal Medicine

## 2017-09-26 NOTE — Telephone Encounter (Signed)
2 page, paperwork recievd through fax please fu at your earliest convenience.

## 2017-10-28 ENCOUNTER — Other Ambulatory Visit: Payer: Self-pay | Admitting: Internal Medicine

## 2017-10-28 DIAGNOSIS — I10 Essential (primary) hypertension: Secondary | ICD-10-CM

## 2017-12-30 ENCOUNTER — Ambulatory Visit: Payer: Medicare Other | Attending: Internal Medicine | Admitting: Internal Medicine

## 2017-12-30 ENCOUNTER — Encounter: Payer: Self-pay | Admitting: Internal Medicine

## 2017-12-30 VITALS — BP 132/72 | HR 55 | Temp 98.0°F | Resp 16 | Ht 72.0 in | Wt 219.0 lb

## 2017-12-30 DIAGNOSIS — Z79899 Other long term (current) drug therapy: Secondary | ICD-10-CM | POA: Insufficient documentation

## 2017-12-30 DIAGNOSIS — I10 Essential (primary) hypertension: Secondary | ICD-10-CM | POA: Diagnosis not present

## 2017-12-30 DIAGNOSIS — G8929 Other chronic pain: Secondary | ICD-10-CM | POA: Diagnosis not present

## 2017-12-30 DIAGNOSIS — F17210 Nicotine dependence, cigarettes, uncomplicated: Secondary | ICD-10-CM | POA: Insufficient documentation

## 2017-12-30 DIAGNOSIS — I712 Thoracic aortic aneurysm, without rupture, unspecified: Secondary | ICD-10-CM

## 2017-12-30 DIAGNOSIS — M109 Gout, unspecified: Secondary | ICD-10-CM | POA: Diagnosis not present

## 2017-12-30 DIAGNOSIS — Z888 Allergy status to other drugs, medicaments and biological substances status: Secondary | ICD-10-CM | POA: Diagnosis not present

## 2017-12-30 DIAGNOSIS — I252 Old myocardial infarction: Secondary | ICD-10-CM | POA: Insufficient documentation

## 2017-12-30 DIAGNOSIS — F17200 Nicotine dependence, unspecified, uncomplicated: Secondary | ICD-10-CM

## 2017-12-30 DIAGNOSIS — M25511 Pain in right shoulder: Secondary | ICD-10-CM | POA: Insufficient documentation

## 2017-12-30 MED ORDER — PREDNISONE 10 MG PO TABS: ORAL_TABLET | ORAL | 0 refills | Status: DC

## 2017-12-30 MED ORDER — COLCHICINE 0.6 MG PO TABS: 0.6000 mg | ORAL_TABLET | Freq: Every day | ORAL | 1 refills | Status: DC

## 2017-12-30 NOTE — Progress Notes (Signed)
Patient ID: Steven Delacruz, male    DOB: [birth year]  MRN: [id number]  CC: re-establish; Hypertension; and Medication Refill   Subjective: Steven Delacruz is a 67 y.o. male who presents for chronic ds management and to est me with PCP His concerns today include:  HTN, HL, tob dep, chronic LBP and RT shoulder pain, remote MI, thoracic aortic aneurysm 3.3 cm in 2016, gout  C/o flare gout in RT foot x few wks; pain around the ankle.  Pain is constant but worse in the mornings.  He has noted some swelling but no redness.  He gets intermittent flare more so in the left foot and reports episodes at least several times in 3 to 72-month period.  He has been taking some ibuprofen.  Was given short course of colchicine in the past by previous PCP but he is out of that.  HTN:  Compliant with meds.  Out of potassium supplement x 6 mths. Limit salt in foods No CP/SOB.  Some SOB if he walks up hill.  Tobacco dependence: He is trying to quit.  He is down to less than half a pack a day.  History of descending thoracic aortic aneurysm noted on chart.  Looks like his last imaging study was back in 2016. Patient Active Problem List   Diagnosis Date Noted  . Injury of right shoulder 06/11/2017  . Needs flu shot 05/30/2016  . Pain in joint, shoulder region, right 07/17/2015  . Essential hypertension, benign 12/12/2014  . Annual physical exam 12/12/2014  . Shortness of breath   . Thoracic aortic aneurysm ([place])   . Pancreatitis 07/11/2014  . Chest pain 07/11/2014  . Accelerated hypertension 07/11/2014  . Back pain at L4-L5 level 03/22/2014  . Pain in joint, shoulder region 03/22/2014  . Essential hypertension 12/21/2013  . Drug-induced erectile dysfunction 12/21/2013  . Colon cancer screening 12/21/2013  . Abnormal CT scan, pancreas - ? focal pancreatitis  05/13/2013  . Acute pancreatitis 05/13/2013  . Chest pain, unspecified 05/13/2013  . Abdominal aortic atherosclerosis ([place]) 05/13/2013  .  Prediabetes 03/18/2013  . Hypertriglyceridemia 03/18/2013  . Chronic back pain   . Right shoulder pain   . DIVERTICULOSIS, COLON 07/10/2006  . COLONIC POLYPS, HX OF 07/10/2006  . TOBACCO ABUSE 05/08/2006  . HYPERTENSION 05/08/2006  . OSTEOARTHRITIS 05/08/2006  . LOW BACK PAIN 05/08/2006     Current Outpatient Medications on File Prior to Visit  Medication Sig Dispense Refill  . acetaminophen-codeine (TYLENOL #3) 300-30 MG tablet Take 1 tablet by mouth every 8 (eight) hours as needed. 90 tablet 0  . amLODipine (NORVASC) 10 MG tablet Take 1 tablet (10 mg total) by mouth daily. 90 tablet 3  . atenolol (TENORMIN) 100 MG tablet Take 1 tablet (100 mg total) by mouth daily. 90 tablet 3  . diclofenac sodium (VOLTAREN) 1 % GEL Apply 2 g topically 4 (four) times daily. 1 Tube 1  . hydrochlorothiazide (HYDRODIURIL) 25 MG tablet Take 1 tablet (25 mg total) by mouth daily. 90 tablet 3  . potassium chloride SA (K-DUR,KLOR-CON) 20 MEQ tablet Take 1 tablet (20 mEq total) by mouth daily. (Patient not taking: Reported on 12/30/2017) 30 tablet 3   No current facility-administered medications on file prior to visit.     Allergies  Allergen Reactions  . Lisinopril Swelling    angioedema    Social History   Socioeconomic History  . Marital status: Divorced    Spouse name: Not on file  . Number of  children: Not on file  . Years of education: Not on file  . Highest education level: Not on file  Occupational History  . Not on file  Social Needs  . Financial resource strain: Not on file  . Food insecurity:    Worry: Not on file    Inability: Not on file  . Transportation needs:    Medical: Not on file    Non-medical: Not on file  Tobacco Use  . Smoking status: Current Every Day Smoker    Packs/day: 0.25    Years: 45.00    Pack years: 11.25    Types: Cigarettes  . Smokeless tobacco: Never Used  . Tobacco comment: chew sticks  Substance and Sexual Activity  . Alcohol use: No    Comment:  quit drinking last weekend  . Drug use: Yes    Types: Marijuana    Comment: occassional  . Sexual activity: Yes  Lifestyle  . Physical activity:    Days per week: Not on file    Minutes per session: Not on file  . Stress: Not on file  Relationships  . Social connections:    Talks on phone: Not on file    Gets together: Not on file    Attends religious service: Not on file    Active member of club or organization: Not on file    Attends meetings of clubs or organizations: Not on file    Relationship status: Not on file  . Intimate partner violence:    Fear of current or ex partner: Not on file    Emotionally abused: Not on file    Physically abused: Not on file    Forced sexual activity: Not on file  Other Topics Concern  . Not on file  Social History Narrative  . Not on file    No family history on file.  Past Surgical History:  Procedure Laterality Date  . NO PAST SURGERIES      ROS: Review of Systems Negative except as stated above PHYSICAL EXAM: BP 132/72   Pulse (!) 55   Temp 98 F (36.7 C) (Oral)   Resp 16   Ht 6' (1.829 m)   Wt 219 lb (99.3 kg)   SpO2 96%   BMI 29.70 kg/m   Wt Readings from Last 3 Encounters:  12/30/17 219 lb (99.3 kg)  06/11/17 231 lb (104.8 kg)  03/31/17 221 lb (100.2 kg)    Physical Exam General appearance - alert, well appearing, and in no distress Mental status - alert, oriented to person, place, and time, normal mood, behavior, speech, dress, motor activity, and thought processes Mouth - mucous membranes moist, pharynx normal without lesions Chest - clear to auscultation, no wheezes, rales or rhonchi, symmetric air entry Heart - normal rate, regular rhythm, normal S1, S2, no murmurs, rubs, clicks or gallops Musculoskeletal -right ankle: Mild edema.  No erythema.  Mild tenderness on palpation of medial aspect.  Discomfort with inversion and eversion. Extremities -no edema in the lower legs   ASSESSMENT AND PLAN: 1.  Essential hypertension At goal.  We discussed perhaps changing HCTZ to a different blood pressure medication given frequent flare of gout.  However we are not able to use lisinopril or Cozaar because of previous history of angioedema on ACE-I - CBC - Lipid panel - Comprehensive metabolic panel  2. Tobacco dependence Advised to quit.  Continue to encourage him towards smoking cessation  3. Acute gout of right ankle, unspecified cause - will give  him a prednisone taper then have him take colchicine once a day.  On next visit we can add allopurinol - predniSONE (DELTASONE) 10 MG tablet; 3 tabs PO daily x 2 days, then 2 abs x 2 days, 1 tab x 2 days  Dispense: 12 tablet; Refill: 0 - DG Ankle Complete Right; Future - colchicine 0.6 MG tablet; Take 1 tablet (0.6 mg total) by mouth daily.  Dispense: 30 tablet; Refill: 1  4. Thoracic aortic aneurysm without rupture ([place]) If kidney function is okay we can order CTA for follow-up on his aneurysm  Patient was given the opportunity to ask questions.  Patient verbalized understanding of the plan and was able to repeat key elements of the plan.   Orders Placed This Encounter  Procedures  . DG Ankle Complete Right  . CBC  . Lipid panel  . Comprehensive metabolic panel     Requested Prescriptions   Signed Prescriptions Disp Refills  . predniSONE (DELTASONE) 10 MG tablet 12 tablet 0    Sig: 3 tabs PO daily x 2 days, then 2 abs x 2 days, 1 tab x 2 days  . colchicine 0.6 MG tablet 30 tablet 1    Sig: Take 1 tablet (0.6 mg total) by mouth daily.    Return in about 3 months (around 04/01/2018).  Karle Plumber, MD, FACP

## 2017-12-31 ENCOUNTER — Telehealth: Payer: Self-pay

## 2017-12-31 ENCOUNTER — Other Ambulatory Visit: Payer: Self-pay | Admitting: Internal Medicine

## 2017-12-31 DIAGNOSIS — I10 Essential (primary) hypertension: Secondary | ICD-10-CM

## 2017-12-31 LAB — COMPREHENSIVE METABOLIC PANEL
ALT: 12 IU/L (ref 0–44)
AST: 16 IU/L (ref 0–40)
Albumin/Globulin Ratio: 1.9 (ref 1.2–2.2)
Albumin: 4.5 g/dL (ref 3.6–4.8)
Alkaline Phosphatase: 89 IU/L (ref 39–117)
BUN/Creatinine Ratio: 10 (ref 10–24)
BUN: 10 mg/dL (ref 8–27)
Bilirubin Total: 0.5 mg/dL (ref 0.0–1.2)
CO2: 23 mmol/L (ref 20–29)
Calcium: 9.8 mg/dL (ref 8.6–10.2)
Chloride: 104 mmol/L (ref 96–106)
Creatinine, Ser: 0.99 mg/dL (ref 0.76–1.27)
GFR calc Af Amer: 92 mL/min/{1.73_m2} (ref >59)
GFR calc non Af Amer: 80 mL/min/{1.73_m2} (ref >59)
Globulin, Total: 2.4 g/dL (ref 1.5–4.5)
Glucose: 91 mg/dL (ref 65–99)
Potassium: 3.1 mmol/L — ABNORMAL LOW (ref 3.5–5.2)
Sodium: 145 mmol/L — ABNORMAL HIGH (ref 134–144)
Total Protein: 6.9 g/dL (ref 6.0–8.5)

## 2017-12-31 LAB — CBC
Hematocrit: 45.9 % (ref 37.5–51.0)
Hemoglobin: 15.6 g/dL (ref 13.0–17.7)
MCH: 32.2 pg (ref 26.6–33.0)
MCHC: 34 g/dL (ref 31.5–35.7)
MCV: 95 fL (ref 79–97)
Platelets: 202 10*3/uL (ref 150–450)
RBC: 4.85 x10E6/uL (ref 4.14–5.80)
RDW: 15.1 % (ref 12.3–15.4)
WBC: 6.1 10*3/uL (ref 3.4–10.8)

## 2017-12-31 LAB — LIPID PANEL
Chol/HDL Ratio: 1.5 ratio (ref 0.0–5.0)
Cholesterol, Total: 187 mg/dL (ref 100–199)
HDL: 121 mg/dL (ref >39)
LDL Calculated: 38 mg/dL (ref 0–99)
Triglycerides: 138 mg/dL (ref 0–149)
VLDL Cholesterol Cal: 28 mg/dL (ref 5–40)

## 2017-12-31 MED ORDER — POTASSIUM CHLORIDE CRYS ER 20 MEQ PO TBCR: 20.0000 meq | EXTENDED_RELEASE_TABLET | Freq: Every day | ORAL | 6 refills | Status: DC

## 2017-12-31 NOTE — Telephone Encounter (Signed)
Contacted pt to go over lab results pt is aware of results and doesn't have any questions or concerns 

## 2018-02-09 ENCOUNTER — Encounter: Payer: Self-pay | Admitting: Pharmacist

## 2018-04-03 ENCOUNTER — Encounter: Payer: Self-pay | Admitting: Internal Medicine

## 2018-04-03 ENCOUNTER — Ambulatory Visit: Payer: Medicare Other | Attending: Internal Medicine | Admitting: Internal Medicine

## 2018-04-03 VITALS — BP 152/81 | HR 55 | Temp 98.7°F | Resp 16 | Ht 72.0 in | Wt 214.4 lb

## 2018-04-03 DIAGNOSIS — E785 Hyperlipidemia, unspecified: Secondary | ICD-10-CM | POA: Insufficient documentation

## 2018-04-03 DIAGNOSIS — M109 Gout, unspecified: Secondary | ICD-10-CM | POA: Insufficient documentation

## 2018-04-03 DIAGNOSIS — G8929 Other chronic pain: Secondary | ICD-10-CM | POA: Diagnosis not present

## 2018-04-03 DIAGNOSIS — M25511 Pain in right shoulder: Secondary | ICD-10-CM | POA: Diagnosis not present

## 2018-04-03 DIAGNOSIS — E781 Pure hyperglyceridemia: Secondary | ICD-10-CM | POA: Insufficient documentation

## 2018-04-03 DIAGNOSIS — I712 Thoracic aortic aneurysm, without rupture: Secondary | ICD-10-CM | POA: Diagnosis not present

## 2018-04-03 DIAGNOSIS — R7303 Prediabetes: Secondary | ICD-10-CM | POA: Diagnosis not present

## 2018-04-03 DIAGNOSIS — Z79899 Other long term (current) drug therapy: Secondary | ICD-10-CM | POA: Insufficient documentation

## 2018-04-03 DIAGNOSIS — I10 Essential (primary) hypertension: Secondary | ICD-10-CM

## 2018-04-03 DIAGNOSIS — I700 Atherosclerosis of aorta: Secondary | ICD-10-CM | POA: Diagnosis not present

## 2018-04-03 DIAGNOSIS — N529 Male erectile dysfunction, unspecified: Secondary | ICD-10-CM | POA: Insufficient documentation

## 2018-04-03 DIAGNOSIS — I252 Old myocardial infarction: Secondary | ICD-10-CM | POA: Insufficient documentation

## 2018-04-03 DIAGNOSIS — Z8739 Personal history of other diseases of the musculoskeletal system and connective tissue: Secondary | ICD-10-CM | POA: Diagnosis not present

## 2018-04-03 DIAGNOSIS — M545 Low back pain: Secondary | ICD-10-CM | POA: Insufficient documentation

## 2018-04-03 DIAGNOSIS — F17200 Nicotine dependence, unspecified, uncomplicated: Secondary | ICD-10-CM

## 2018-04-03 DIAGNOSIS — M1990 Unspecified osteoarthritis, unspecified site: Secondary | ICD-10-CM | POA: Diagnosis not present

## 2018-04-03 DIAGNOSIS — H538 Other visual disturbances: Secondary | ICD-10-CM | POA: Diagnosis not present

## 2018-04-03 DIAGNOSIS — Z23 Encounter for immunization: Secondary | ICD-10-CM

## 2018-04-03 DIAGNOSIS — F17210 Nicotine dependence, cigarettes, uncomplicated: Secondary | ICD-10-CM | POA: Insufficient documentation

## 2018-04-03 MED ORDER — VARENICLINE TARTRATE 1 MG PO TABS: 1.0000 mg | ORAL_TABLET | Freq: Two times a day (BID) | ORAL | 1 refills | Status: DC

## 2018-04-03 MED ORDER — VARENICLINE TARTRATE 0.5 MG X 11 & 1 MG X 42 PO MISC: ORAL | 0 refills | Status: DC

## 2018-04-03 MED ORDER — COLCHICINE 0.6 MG PO TABS: 0.6000 mg | ORAL_TABLET | Freq: Every day | ORAL | 6 refills | Status: DC

## 2018-04-03 NOTE — Progress Notes (Signed)
Patient ID: Steven Delacruz, male    DOB: 05/01/1951  MRN: [id number]  CC: Follow-up   Subjective: Steven Delacruz is a 67 y.o. male who presents for chronic ds management. His concerns today include:  HTN, HL, tob dep, chronic LBP and RT shoulder pain, remote MI, thoracic aortic aneurysm 3.3 cm in 2016, gout  Gout: On last visit we had given him a tapering dose of prednisone and started him on colchicine once a day.  No flare up since last visit.  HTN:  No device to check.  Compliant with meds and salt restriction No CP/SOB/LE edema  Tob: Reports that he has cut back.  Was smoking a pack a day now down to a half a pack.  "I wish I can quit."  Tried to quit cold [place] in past but the cravings were too strong.  Patient Active Problem List   Diagnosis Date Noted  . Injury of right shoulder 06/11/2017  . Needs flu shot 05/30/2016  . Pain in joint, shoulder region, right 07/17/2015  . Essential hypertension, benign 12/12/2014  . Annual physical exam 12/12/2014  . Shortness of breath   . Thoracic aortic aneurysm ([place])   . Pancreatitis 07/11/2014  . Chest pain 07/11/2014  . Accelerated hypertension 07/11/2014  . Back pain at L4-L5 level 03/22/2014  . Pain in joint, shoulder region 03/22/2014  . Essential hypertension 12/21/2013  . Drug-induced erectile dysfunction 12/21/2013  . Colon cancer screening 12/21/2013  . Abnormal CT scan, pancreas - ? focal pancreatitis  05/13/2013  . Acute pancreatitis 05/13/2013  . Chest pain, unspecified 05/13/2013  . Abdominal aortic atherosclerosis ([place]) 05/13/2013  . Prediabetes 03/18/2013  . Hypertriglyceridemia 03/18/2013  . Chronic back pain   . Right shoulder pain   . DIVERTICULOSIS, COLON 07/10/2006  . COLONIC POLYPS, HX OF 07/10/2006  . TOBACCO ABUSE 05/08/2006  . HYPERTENSION 05/08/2006  . OSTEOARTHRITIS 05/08/2006  . LOW BACK PAIN 05/08/2006     Current Outpatient Medications on File Prior to Visit  Medication Sig Dispense  Refill  . amLODipine (NORVASC) 10 MG tablet Take 1 tablet (10 mg total) by mouth daily. 90 tablet 3  . atenolol (TENORMIN) 100 MG tablet Take 1 tablet (100 mg total) by mouth daily. 90 tablet 3  . hydrochlorothiazide (HYDRODIURIL) 25 MG tablet Take 1 tablet (25 mg total) by mouth daily. 90 tablet 3  . potassium chloride SA (K-DUR,KLOR-CON) 20 MEQ tablet Take 1 tablet (20 mEq total) by mouth daily. 30 tablet 6  . diclofenac sodium (VOLTAREN) 1 % GEL Apply 2 g topically 4 (four) times daily. (Patient not taking: Reported on 04/03/2018) 1 Tube 1   No current facility-administered medications on file prior to visit.     Allergies  Allergen Reactions  . Lisinopril Swelling    angioedema    Social History   Socioeconomic History  . Marital status: Divorced    Spouse name: Not on file  . Number of children: Not on file  . Years of education: Not on file  . Highest education level: Not on file  Occupational History  . Not on file  Social Needs  . Financial resource strain: Not on file  . Food insecurity:    Worry: Not on file    Inability: Not on file  . Transportation needs:    Medical: Not on file    Non-medical: Not on file  Tobacco Use  . Smoking status: Current Every Day Smoker    Packs/day: 0.25  Years: 45.00    Pack years: 11.25    Types: Cigarettes  . Smokeless tobacco: Never Used  . Tobacco comment: chew sticks  Substance and Sexual Activity  . Alcohol use: No    Comment: quit drinking last weekend  . Drug use: Yes    Types: Marijuana    Comment: occassional  . Sexual activity: Yes  Lifestyle  . Physical activity:    Days per week: Not on file    Minutes per session: Not on file  . Stress: Not on file  Relationships  . Social connections:    Talks on phone: Not on file    Gets together: Not on file    Attends religious service: Not on file    Active member of club or organization: Not on file    Attends meetings of clubs or organizations: Not on file     Relationship status: Not on file  . Intimate partner violence:    Fear of current or ex partner: Not on file    Emotionally abused: Not on file    Physically abused: Not on file    Forced sexual activity: Not on file  Other Topics Concern  . Not on file  Social History Narrative  . Not on file    History reviewed. No pertinent family history.  Past Surgical History:  Procedure Laterality Date  . NO PAST SURGERIES      ROS: Review of Systems Reports some blurred vision.  Last eye exam was a little over a year ago.  He wears bifocals. Marland Kitchen PHYSICAL EXAM: BP (!) 152/81 (BP Location: Left Arm, Patient Position: Sitting, Cuff Size: Large)   Pulse (!) 55   Temp 98.7 F (37.1 C) (Oral)   Resp 16   Ht 6' (1.829 m)   Wt 214 lb 6.4 oz (97.3 kg)   SpO2 98%   BMI 29.08 kg/m   Physical Exam  General appearance - alert, well appearing, and in no distress Mental status - normal mood, behavior, speech, dress, motor activity, and thought processes Neck - supple, no significant adenopathy Chest - clear to auscultation, no wheezes, rales or rhonchi, symmetric air entry Heart - normal rate, regular rhythm, normal S1, S2, no murmurs, rubs, clicks or gallops Extremities - peripheral pulses normal, no pedal edema, no clubbing or cyanosis  Results for orders placed or performed in visit on 12/30/17  CBC  Result Value Ref Range   WBC 6.1 3.4 - 10.8 x10E3/uL   RBC 4.85 4.14 - 5.80 x10E6/uL   Hemoglobin 15.6 13.0 - 17.7 g/dL   Hematocrit 45.9 37.5 - 51.0 %   MCV 95 79 - 97 fL   MCH 32.2 26.6 - 33.0 pg   MCHC 34.0 31.5 - 35.7 g/dL   RDW 15.1 12.3 - 15.4 %   Platelets 202 150 - 450 x10E3/uL  Lipid panel  Result Value Ref Range   Cholesterol, Total 187 100 - 199 mg/dL   Triglycerides 138 0 - 149 mg/dL   HDL 121 >39 mg/dL   VLDL Cholesterol Cal 28 5 - 40 mg/dL   LDL Calculated 38 0 - 99 mg/dL   Chol/HDL Ratio 1.5 0.0 - 5.0 ratio  Comprehensive metabolic panel  Result Value Ref Range    Glucose 91 65 - 99 mg/dL   BUN 10 8 - 27 mg/dL   Creatinine, Ser 0.99 0.76 - 1.27 mg/dL   GFR calc non Af Amer 80 >59 mL/min/1.73   GFR calc Af Amer 92 >  59 mL/min/1.73   BUN/Creatinine Ratio 10 10 - 24   Sodium 145 (H) 134 - 144 mmol/L   Potassium 3.1 (L) 3.5 - 5.2 mmol/L   Chloride 104 96 - 106 mmol/L   CO2 23 20 - 29 mmol/L   Calcium 9.8 8.6 - 10.2 mg/dL   Total Protein 6.9 6.0 - 8.5 g/dL   Albumin 4.5 3.6 - 4.8 g/dL   Globulin, Total 2.4 1.5 - 4.5 g/dL   Albumin/Globulin Ratio 1.9 1.2 - 2.2   Bilirubin Total 0.5 0.0 - 1.2 mg/dL   Alkaline Phosphatase 89 39 - 117 IU/L   AST 16 0 - 40 IU/L   ALT 12 0 - 44 IU/L    ASSESSMENT AND PLAN: 1. Essential hypertension Not at goal I have given him a prescription to get blood pressure monitoring device.  Advised to check blood pressure at least 2-3 times a week and bring in readings on next visit. I have scheduled him to see the clinical pharmacist in 1 month for repeat blood pressure check.  If still elevated we can consider adding low-dose hydralazine.  Patient has history of angioedema to ACE inhibitors. - Microalbumin / creatinine urine ratio  2. History of gout - colchicine 0.6 MG tablet; Take 1 tablet (0.6 mg total) by mouth daily.  Dispense: 30 tablet; Refill: 6  3. Tobacco dependence Patient advised to quit smoking. Discussed health risks associated with smoking including lung and other types of cancers, chronic lung diseases and CV risks.. Pt ready to give trail of quitting.  Discussed methods to help quit including quitting cold [place], use of NRT, Chantix and Bupropion.  He wants to try Chantix.  I went over possible side effects including bad dreams and mood swings.  Advised to call me if he has any of the side effects. - varenicline (CHANTIX STARTING MONTH PAK) 0.5 MG X 11 & 1 MG X 42 tablet; one 0.5 mg tab PO once daily for 3 days, then increase to one 0.5 mg tab BID for 4 days, then increase to one 1 mg tablet twice daily.   Dispense: 53 tablet; Refill: 0 - varenicline (CHANTIX CONTINUING MONTH PAK) 1 MG tablet; Take 1 tablet (1 mg total) by mouth 2 (two) times daily.  Dispense: 60 tablet; Refill: 1  4. Need for vaccination against Streptococcus pneumoniae using pneumococcal conjugate vaccine 13 - Pneumococcal conjugate vaccine 13-valent IM  5. Blurred vision - Ambulatory referral to Optometry  6. Need for immunization against influenza - Flu Vaccine QUAD 36+ mos IM  Patient was given the opportunity to ask questions.  Patient verbalized understanding of the plan and was able to repeat key elements of the plan.   Orders Placed This Encounter  Procedures  . Pneumococcal conjugate vaccine 13-valent IM  . Flu Vaccine QUAD 36+ mos IM  . Microalbumin / creatinine urine ratio  . Ambulatory referral to Optometry     Requested Prescriptions   Signed Prescriptions Disp Refills  . colchicine 0.6 MG tablet 30 tablet 6    Sig: Take 1 tablet (0.6 mg total) by mouth daily.  . varenicline (CHANTIX STARTING MONTH PAK) 0.5 MG X 11 & 1 MG X 42 tablet 53 tablet 0    Sig: one 0.5 mg tab PO once daily for 3 days, then increase to one 0.5 mg tab BID for 4 days, then increase to one 1 mg tablet twice daily.  . varenicline (CHANTIX CONTINUING MONTH PAK) 1 MG tablet 60 tablet 1  Sig: Take 1 tablet (1 mg total) by mouth 2 (two) times daily.    Return in about 3 months (around 07/04/2018).  Karle Plumber, MD, FACP

## 2018-04-03 NOTE — Progress Notes (Signed)
Recheck of BP:  149/75.  Initial:  152/81

## 2018-04-03 NOTE — Patient Instructions (Signed)
Please give appointment with clinical pharmacist in 2 weeks for repeat blood pressure check.   I have given you a prescription to try and get a home blood pressure machine so that you can check your blood pressure at least twice a week.  If you do get the device please write down your readings and bring them in on your next visit.  The goal is to keep your blood pressure 130/80 or lower.

## 2018-04-04 LAB — MICROALBUMIN / CREATININE URINE RATIO
Creatinine, Urine: 116.4 mg/dL
Microalb/Creat Ratio: 11.4 mg/g creat (ref 0.0–30.0)
Microalbumin, Urine: 13.3 ug/mL

## 2018-04-24 ENCOUNTER — Other Ambulatory Visit: Payer: Self-pay | Admitting: Internal Medicine

## 2018-04-24 DIAGNOSIS — I10 Essential (primary) hypertension: Secondary | ICD-10-CM

## 2018-07-10 ENCOUNTER — Encounter: Payer: Self-pay | Admitting: Internal Medicine

## 2018-07-10 ENCOUNTER — Ambulatory Visit: Payer: Medicare Other | Attending: Internal Medicine | Admitting: Internal Medicine

## 2018-07-10 VITALS — BP 183/89 | HR 57 | Temp 97.8°F | Resp 16 | Ht 72.0 in | Wt 210.6 lb

## 2018-07-10 DIAGNOSIS — K921 Melena: Secondary | ICD-10-CM | POA: Diagnosis not present

## 2018-07-10 DIAGNOSIS — F17210 Nicotine dependence, cigarettes, uncomplicated: Secondary | ICD-10-CM | POA: Diagnosis not present

## 2018-07-10 DIAGNOSIS — I10 Essential (primary) hypertension: Secondary | ICD-10-CM | POA: Insufficient documentation

## 2018-07-10 DIAGNOSIS — M109 Gout, unspecified: Secondary | ICD-10-CM | POA: Insufficient documentation

## 2018-07-10 DIAGNOSIS — I712 Thoracic aortic aneurysm, without rupture, unspecified: Secondary | ICD-10-CM

## 2018-07-10 DIAGNOSIS — Z79899 Other long term (current) drug therapy: Secondary | ICD-10-CM | POA: Diagnosis not present

## 2018-07-10 DIAGNOSIS — Z888 Allergy status to other drugs, medicaments and biological substances status: Secondary | ICD-10-CM | POA: Insufficient documentation

## 2018-07-10 DIAGNOSIS — I700 Atherosclerosis of aorta: Secondary | ICD-10-CM | POA: Insufficient documentation

## 2018-07-10 DIAGNOSIS — F17200 Nicotine dependence, unspecified, uncomplicated: Secondary | ICD-10-CM

## 2018-07-10 DIAGNOSIS — G8929 Other chronic pain: Secondary | ICD-10-CM | POA: Diagnosis not present

## 2018-07-10 DIAGNOSIS — K625 Hemorrhage of anus and rectum: Secondary | ICD-10-CM

## 2018-07-10 DIAGNOSIS — M25511 Pain in right shoulder: Secondary | ICD-10-CM | POA: Insufficient documentation

## 2018-07-10 DIAGNOSIS — I252 Old myocardial infarction: Secondary | ICD-10-CM | POA: Diagnosis not present

## 2018-07-10 DIAGNOSIS — F1010 Alcohol abuse, uncomplicated: Secondary | ICD-10-CM | POA: Diagnosis not present

## 2018-07-10 DIAGNOSIS — M545 Low back pain: Secondary | ICD-10-CM | POA: Diagnosis not present

## 2018-07-10 DIAGNOSIS — E781 Pure hyperglyceridemia: Secondary | ICD-10-CM | POA: Diagnosis not present

## 2018-07-10 DIAGNOSIS — E663 Overweight: Secondary | ICD-10-CM

## 2018-07-10 DIAGNOSIS — R7303 Prediabetes: Secondary | ICD-10-CM | POA: Insufficient documentation

## 2018-07-10 MED ORDER — CARVEDILOL 6.25 MG PO TABS: 6.2500 mg | ORAL_TABLET | Freq: Two times a day (BID) | ORAL | 3 refills | Status: DC

## 2018-07-10 NOTE — Patient Instructions (Signed)
Please give appointment with clinical pharmacist [facility] in 2 weeks for repeat blood pressure check.   Stop atenolol.  Start a new blood pressure medication called carvedilol instead.  He will take this twice a day.  Try to cut back on drinking beer.  You have set a goal to cut back to one 40 ounce a day.

## 2018-07-10 NOTE — Progress Notes (Signed)
Patient ID: Steven Delacruz, male    DOB: 26-Nov-1950  MRN: [id number]  CC: Hypertension   Subjective: Steven Delacruz is a 68 y.o. male who presents for chronic ds management His concerns today include:  HTN, HL, tob dep, chronic LBP and RT shoulder pain, remote MI, thoracic aortic aneurysm 3.3 cm in 2016, gout  Tob dep:  Chantix worked pretty good.  Down to 1 a day.  He has set a goal to quit by his next birthday which is next month.    HTN: compliant with meds and salt restriction. Uses salt substitute Denies any chest pains, shortness of breath, lower extremity edema, chronic headaches or dizziness. Patient with history of thoracic aortic aneurysm.  Last image in 2016.  Patient has lost about 10 pounds since July of last year.  He is glad for the weight loss.  States that he wants to lose more especially his abdomen.  Plans to join a gym soon.  Drinks bottled water.  He also drinks 1-2 40 oz beer a day.  Use to drink 4 a day.   Loves Ensure and cranberry juices Moving his urine okay with no hematuria. Reports few episodes of blood in his stools about 2 weeks ago.  He denies any constipation at the time.  Had a colonoscopy in the past but does not recall when.  He states that no polyps or anything had to be removed.  Gout:  No flare since last visit  Patient Active Problem List   Diagnosis Date Noted  . Injury of right shoulder 06/11/2017  . Needs flu shot 05/30/2016  . Pain in joint, shoulder region, right 07/17/2015  . Essential hypertension, benign 12/12/2014  . Annual physical exam 12/12/2014  . Shortness of breath   . Thoracic aortic aneurysm ([place])   . Pancreatitis 07/11/2014  . Chest pain 07/11/2014  . Accelerated hypertension 07/11/2014  . Back pain at L4-L5 level 03/22/2014  . Pain in joint, shoulder region 03/22/2014  . Essential hypertension 12/21/2013  . Drug-induced erectile dysfunction 12/21/2013  . Colon cancer screening 12/21/2013  . Abnormal CT scan,  pancreas - ? focal pancreatitis  05/13/2013  . Acute pancreatitis 05/13/2013  . Chest pain, unspecified 05/13/2013  . Abdominal aortic atherosclerosis ([place]) 05/13/2013  . Prediabetes 03/18/2013  . Hypertriglyceridemia 03/18/2013  . Chronic back pain   . Right shoulder pain   . DIVERTICULOSIS, COLON 07/10/2006  . COLONIC POLYPS, HX OF 07/10/2006  . TOBACCO ABUSE 05/08/2006  . HYPERTENSION 05/08/2006  . OSTEOARTHRITIS 05/08/2006  . LOW BACK PAIN 05/08/2006     Current Outpatient Medications on File Prior to Visit  Medication Sig Dispense Refill  . amLODipine (NORVASC) 10 MG tablet Take 1 tablet (10 mg total) by mouth daily. 90 tablet 3  . colchicine 0.6 MG tablet Take 1 tablet (0.6 mg total) by mouth daily. 30 tablet 6  . diclofenac sodium (VOLTAREN) 1 % GEL Apply 2 g topically 4 (four) times daily. (Patient not taking: Reported on 04/03/2018) 1 Tube 1  . hydrochlorothiazide (HYDRODIURIL) 25 MG tablet Take 1 tablet (25 mg total) by mouth daily. 90 tablet 3  . potassium chloride SA (K-DUR,KLOR-CON) 20 MEQ tablet Take 1 tablet (20 mEq total) by mouth daily. 30 tablet 6  . varenicline (CHANTIX CONTINUING MONTH PAK) 1 MG tablet Take 1 tablet (1 mg total) by mouth 2 (two) times daily. (Patient not taking: Reported on 07/10/2018) 60 tablet 1  . varenicline (CHANTIX STARTING MONTH PAK) 0.5 MG X  11 & 1 MG X 42 tablet one 0.5 mg tab PO once daily for 3 days, then increase to one 0.5 mg tab BID for 4 days, then increase to one 1 mg tablet twice daily. (Patient not taking: Reported on 07/10/2018) 53 tablet 0   No current facility-administered medications on file prior to visit.     Allergies  Allergen Reactions  . Lisinopril Swelling    angioedema    Social History   Socioeconomic History  . Marital status: Divorced    Spouse name: Not on file  . Number of children: Not on file  . Years of education: Not on file  . Highest education level: Not on file  Occupational History  . Not on file    Social Needs  . Financial resource strain: Not on file  . Food insecurity:    Worry: Not on file    Inability: Not on file  . Transportation needs:    Medical: Not on file    Non-medical: Not on file  Tobacco Use  . Smoking status: Current Every Day Smoker    Packs/day: 0.25    Years: 45.00    Pack years: 11.25    Types: Cigarettes  . Smokeless tobacco: Never Used  . Tobacco comment: chew sticks  Substance and Sexual Activity  . Alcohol use: No    Comment: quit drinking last weekend  . Drug use: Yes    Types: Marijuana    Comment: occassional  . Sexual activity: Yes  Lifestyle  . Physical activity:    Days per week: Not on file    Minutes per session: Not on file  . Stress: Not on file  Relationships  . Social connections:    Talks on phone: Not on file    Gets together: Not on file    Attends religious service: Not on file    Active member of club or organization: Not on file    Attends meetings of clubs or organizations: Not on file    Relationship status: Not on file  . Intimate partner violence:    Fear of current or ex partner: Not on file    Emotionally abused: Not on file    Physically abused: Not on file    Forced sexual activity: Not on file  Other Topics Concern  . Not on file  Social History Narrative  . Not on file    No family history on file.  Past Surgical History:  Procedure Laterality Date  . NO PAST SURGERIES      ROS: Review of Systems Negative except as above. PHYSICAL EXAM: BP (!) 183/89   Pulse (!) 57   Temp 97.8 F (36.6 C) (Oral)   Resp 16   Ht 6' (1.829 m)   Wt 210 lb 9.6 oz (95.5 kg)   SpO2 100%   BMI 28.56 kg/m   Wt Readings from Last 3 Encounters:  07/10/18 210 lb 9.6 oz (95.5 kg)  04/03/18 214 lb 6.4 oz (97.3 kg)  12/30/17 219 lb (99.3 kg)   BP 183/86 Physical Exam  General appearance - alert, well appearing, and in no distress Mental status - normal mood, behavior, speech, dress, motor activity, and thought  processes Mouth - mucous membranes moist, pharynx normal without lesions Neck - supple, no significant adenopathy Chest - clear to auscultation, no wheezes, rales or rhonchi, symmetric air entry Heart - normal rate, regular rhythm, normal S1, S2, no murmurs, rubs, clicks or gallops Abdomen - soft, nontender,  nondistended, no masses or organomegaly Rectal -no external hemorrhoids appreciated.  Brown stools within the rectal vault.  Hemoccult was positive. Extremities -no lower extremity edema.  ASSESSMENT AND PLAN: 1. Essential hypertension Not at goal.  We will have him stop atenolol and change to carvedilol instead for better blood pressure lowering effect.  Follow-up with clinical pharmacist in 2 weeks for repeat blood pressure check. - carvedilol (COREG) 6.25 MG tablet; Take 1 tablet (6.25 mg total) by mouth 2 (two) times daily with a meal.  Dispense: 60 tablet; Refill: 3 - Basic Metabolic Panel  2. Thoracic aortic aneurysm without rupture [facility]) Patient agreeable for repeat CTA at this time to evaluate for any growth - CT Angio Chest W/Cm &/Or Wo Cm; Future  3. Tobacco dependence Commended him on progress made so far.  He has done well with Chantix.  He has set a quit date for 08/26/2018 which is his birthday. Less than 5 minutes spent on counseling. 4. Rectal bleeding We will have my nurse call Dr. Collene Mares office to inquire whether patient had colonoscopy there in 2018 and if so to please send [place] a copy of the report - CBC  5. Alcohol use disorder, mild, abuse I went over how much she is too much in 1 setting for a male.  Commended him on how much he has cut back so far.  Challenged him to cut back more.  He has set a goal of drinking only one 40 ounce beer a day  6. Over weight Healthy eating habits discussed.  Advised to avoid sugary drinks, cut back on white carbohydrates, limit portion sizes and try to eat more white meat than red meat. Encourage him to get out and walk at least 3  to 4 days a week for 15 to 20 minutes. - Hemoglobin A1c - TSH  Patient was given the opportunity to ask questions.  Patient verbalized understanding of the plan and was able to repeat key elements of the plan.   Orders Placed This Encounter  Procedures  . CT Angio Chest W/Cm &/Or Wo Cm  . Hemoglobin A1c  . Basic Metabolic Panel  . TSH  . CBC     Requested Prescriptions   Signed Prescriptions Disp Refills  . carvedilol (COREG) 6.25 MG tablet 60 tablet 3    Sig: Take 1 tablet (6.25 mg total) by mouth 2 (two) times daily with a meal.    Return in about 3 months (around 10/09/2018).  Karle Plumber, MD, FACP

## 2018-07-11 ENCOUNTER — Other Ambulatory Visit: Payer: Self-pay | Admitting: Internal Medicine

## 2018-07-11 LAB — TSH: TSH: 0.909 u[IU]/mL (ref 0.450–4.500)

## 2018-07-11 LAB — BASIC METABOLIC PANEL
BUN/Creatinine Ratio: 11 (ref 10–24)
BUN: 11 mg/dL (ref 8–27)
CO2: 23 mmol/L (ref 20–29)
Calcium: 9.7 mg/dL (ref 8.6–10.2)
Chloride: 106 mmol/L (ref 96–106)
Creatinine, Ser: 0.98 mg/dL (ref 0.76–1.27)
GFR calc Af Amer: 93 mL/min/{1.73_m2} (ref >59)
GFR calc non Af Amer: 81 mL/min/{1.73_m2} (ref >59)
Glucose: 110 mg/dL — ABNORMAL HIGH (ref 65–99)
Potassium: 3.4 mmol/L — ABNORMAL LOW (ref 3.5–5.2)
Sodium: 147 mmol/L — ABNORMAL HIGH (ref 134–144)

## 2018-07-11 LAB — HEMOGLOBIN A1C
Est. average glucose Bld gHb Est-mCnc: 114 mg/dL
Hgb A1c MFr Bld: 5.6 % (ref 4.8–5.6)

## 2018-07-11 LAB — CBC
Hematocrit: 44.1 % (ref 37.5–51.0)
Hemoglobin: 15.4 g/dL (ref 13.0–17.7)
MCH: 32.6 pg (ref 26.6–33.0)
MCHC: 34.9 g/dL (ref 31.5–35.7)
MCV: 93 fL (ref 79–97)
Platelets: 194 10*3/uL (ref 150–450)
RBC: 4.72 x10E6/uL (ref 4.14–5.80)
RDW: 13.8 % (ref 11.6–15.4)
WBC: 4.9 10*3/uL (ref 3.4–10.8)

## 2018-07-14 ENCOUNTER — Other Ambulatory Visit: Payer: Self-pay

## 2018-07-14 ENCOUNTER — Ambulatory Visit (HOSPITAL_COMMUNITY): Admission: RE | Admit: 2018-07-14 | Payer: Medicare Other | Source: Ambulatory Visit

## 2018-07-15 ENCOUNTER — Encounter: Payer: Self-pay | Admitting: Internal Medicine

## 2018-07-15 DIAGNOSIS — K625 Hemorrhage of anus and rectum: Secondary | ICD-10-CM

## 2018-07-15 DIAGNOSIS — D126 Benign neoplasm of colon, unspecified: Secondary | ICD-10-CM

## 2018-07-15 DIAGNOSIS — I712 Thoracic aortic aneurysm, without rupture, unspecified: Secondary | ICD-10-CM

## 2018-07-15 NOTE — Progress Notes (Unsigned)
I received c-scope report from Dr. Jossie Ng office.  Done 08/2015.  Pt had 9 polyps removed some sessile and up to 20-30 mm in size.  Pathology revealed tubular adenomas.  It was recommended that c-scope be repeated in 1 yr.

## 2018-07-16 ENCOUNTER — Other Ambulatory Visit: Payer: Self-pay

## 2018-07-16 ENCOUNTER — Other Ambulatory Visit: Payer: Self-pay | Admitting: Internal Medicine

## 2018-07-16 DIAGNOSIS — I10 Essential (primary) hypertension: Secondary | ICD-10-CM

## 2018-07-16 MED ORDER — CARVEDILOL 6.25 MG PO TABS: 6.2500 mg | ORAL_TABLET | Freq: Two times a day (BID) | ORAL | 3 refills | Status: DC

## 2018-07-16 MED ORDER — POTASSIUM CHLORIDE CRYS ER 20 MEQ PO TBCR: 20.0000 meq | EXTENDED_RELEASE_TABLET | Freq: Every day | ORAL | 6 refills | Status: DC

## 2018-07-16 NOTE — Progress Notes (Signed)
Contacted pt and went over Dr. Wynetta Emery message. I resent Carvedilol to [facility].   Dr. Wynetta Emery pt states he will like the referral placed. Pt also states that he doesn't have any k supplements and if you will be able to send a rx to [facility]?

## 2018-07-20 NOTE — Progress Notes (Signed)
Went on the [employer] and there is no prior auth needed

## 2018-07-20 NOTE — Progress Notes (Signed)
CT is scheduled for July 23, 2017 @3pm  @[facility]. Pt is to arrive @245pm . Pt is to have only clear liquids 4 hours prior.   Contacted pt and made aware of appointment and pt doesn't have any questions or concerns

## 2018-07-23 ENCOUNTER — Other Ambulatory Visit: Payer: Self-pay | Admitting: Internal Medicine

## 2018-07-23 ENCOUNTER — Encounter (HOSPITAL_COMMUNITY): Payer: Self-pay

## 2018-07-23 ENCOUNTER — Ambulatory Visit (HOSPITAL_COMMUNITY)
Admission: RE | Admit: 2018-07-23 | Discharge: 2018-07-23 | Disposition: A | Discharge status: Home / Self Care | Payer: Medicare Other | Source: Ambulatory Visit | Attending: Internal Medicine | Admitting: Internal Medicine

## 2018-07-23 DIAGNOSIS — I10 Essential (primary) hypertension: Secondary | ICD-10-CM

## 2018-07-23 DIAGNOSIS — I712 Thoracic aortic aneurysm, without rupture, unspecified: Secondary | ICD-10-CM

## 2018-07-23 MED ORDER — IOPAMIDOL (ISOVUE-370) INJECTION 76%
INTRAVENOUS | Status: AC
  Filled 2018-07-23: qty 100

## 2018-07-24 ENCOUNTER — Ambulatory Visit: Payer: Medicare Other | Attending: Family Medicine | Admitting: Pharmacist

## 2018-07-24 VITALS — BP 145/71 | HR 71

## 2018-07-24 DIAGNOSIS — F17200 Nicotine dependence, unspecified, uncomplicated: Secondary | ICD-10-CM | POA: Insufficient documentation

## 2018-07-24 DIAGNOSIS — I10 Essential (primary) hypertension: Secondary | ICD-10-CM | POA: Insufficient documentation

## 2018-07-24 NOTE — Patient Instructions (Signed)
Thank you for coming to see us today.   Blood pressure today is improving.  Continue taking blood pressure medications as prescribed.   Limiting salt and caffeine, as well as exercising as able for at least 30 minutes for 5 days out of the week, can also help you lower your blood pressure.  Take your blood pressure at home if you are able. Please write down these numbers and bring them to your visits.  If you have any questions about medications, please call me [phone].  Luke  

## 2018-07-24 NOTE — Progress Notes (Signed)
    S:  PCP: Dr. Wynetta Emery    Patient arrives in good spirits. Presents to the clinic for hypertension management. Patient was referred by Dr. Wynetta Emery on 07/10/2018. BP was elevated at that visit - carvedilol started in place of atenolol.   Patient reports adherence with medications. Denies chest pain, shortness of breath, HA or blurred vision.  Current BP Medications include:  Amlodipine 10 mg, carvedilol 6.25 mg, HCTZ 25 mg   Antihypertensives tried in the past include: amlodipine 5 mg; atenolol 50 mg, 100 mg; HCTZ 12.5 mg; lisinopril (angioedema)  Dietary habits include: limits salt ; drinks 1-2 cups of coffee/day Exercise habits include: "I walk plenty" Family / Social history: current every day smoker (in the process of quitting); drinks one 40 oz. beer/day   Home BP readings: does not take at home  O:  L arm after 5 minutes rest: 145/71, HR 71  Last 3 Office BP readings: BP Readings from Last 3 Encounters:  07/24/18 (!) 145/71  07/10/18 (!) 183/89  04/03/18 (!) 152/81   BMET    Component Value Date/Time   NA 147 (H) 07/10/2018 1226   K 3.4 (L) 07/10/2018 1226   CL 106 07/10/2018 1226   CO2 23 07/10/2018 1226   GLUCOSE 110 (H) 07/10/2018 1226   GLUCOSE 102 (H) 05/30/2016 0943   BUN 11 07/10/2018 1226   CREATININE 0.98 07/10/2018 1226   CREATININE 1.11 05/30/2016 0943   CALCIUM 9.7 07/10/2018 1226   GFRNONAA 81 07/10/2018 1226   GFRNONAA 70 05/30/2016 0943   GFRAA 93 07/10/2018 1226   GFRAA 81 05/30/2016 0943   Renal function: CrCl cannot be calculated (Unknown ideal weight.).  Clinical ASCVD: No hx of stroke or ACS/NSTEMI/STEMI however pt has hx of abdominal aortic atherosclerosis. Last LDL 38 (12/2017).   A/P: Hypertension longstanding currently uncontrolled but improved since starting carvedilol in place of atenolol. BP Goal  <130/80 mmHg. Patient is adherent with current medications. He was just started on carvedilol last week and his BP is better. He does not  see his PCP until 09/2018. Will not make changes at this time and have pt follow-up with me in 1 month.  -Continued current regimen.  -F/u labs ordered - none -Counseled on lifestyle modifications for blood pressure control including reduced dietary sodium, increased exercise, adequate sleep  Results reviewed and written information provided. Total time in face-to-face counseling 15 minutes.   F/U Clinic Visit in 1 month.   Benard Halsted, PharmD, [place] [phone]

## 2018-08-22 ENCOUNTER — Other Ambulatory Visit: Payer: Self-pay | Admitting: Internal Medicine

## 2018-08-22 DIAGNOSIS — I10 Essential (primary) hypertension: Secondary | ICD-10-CM

## 2018-08-24 ENCOUNTER — Ambulatory Visit: Payer: Medicare Other | Attending: Internal Medicine | Admitting: Pharmacist

## 2018-08-24 ENCOUNTER — Encounter: Payer: Self-pay | Admitting: Pharmacist

## 2018-08-24 VITALS — BP 158/72 | HR 72

## 2018-08-24 DIAGNOSIS — I10 Essential (primary) hypertension: Secondary | ICD-10-CM

## 2018-08-24 MED ORDER — SPIRONOLACTONE 25 MG PO TABS: 25.0000 mg | ORAL_TABLET | Freq: Every day | ORAL | 2 refills | Status: DC

## 2018-08-24 NOTE — Progress Notes (Signed)
    S:  PCP: Dr. Wynetta Emery    Patient arrives in good spirits. Presents to the clinic for hypertension management. Patient was referred by Dr. Wynetta Emery on 07/10/2018. I last saw him on 07/24/18 - made no changes to his regimen.    Patient reports adherence with medications. Denies chest pain, HA or blurred vision. Reports baseline shortness of breath.  Current BP Medications include:  Amlodipine 10 mg, carvedilol 6.25 mg BID, HCTZ 25 mg   Antihypertensives tried in the past include: amlodipine 5 mg; atenolol 50 mg, 100 mg; HCTZ 12.5 mg; lisinopril (angioedema)  Dietary habits include: limits salt ; drinks 1-2 cups of coffee/day Exercise habits include: "I walk plenty" Family / Social history: current every day smoker (in the process of quitting); drinks one 40 oz. beer/day   Home BP readings: does not take at home  O:  L arm after 5 minutes rest: 158/72, HR 72 Last 3 Office BP readings: BP Readings from Last 3 Encounters:  08/24/18 (!) 158/72  07/24/18 (!) 145/71  07/10/18 (!) 183/89   BMET    Component Value Date/Time   NA 147 (H) 07/10/2018 1226   K 3.4 (L) 07/10/2018 1226   CL 106 07/10/2018 1226   CO2 23 07/10/2018 1226   GLUCOSE 110 (H) 07/10/2018 1226   GLUCOSE 102 (H) 05/30/2016 0943   BUN 11 07/10/2018 1226   CREATININE 0.98 07/10/2018 1226   CREATININE 1.11 05/30/2016 0943   CALCIUM 9.7 07/10/2018 1226   GFRNONAA 81 07/10/2018 1226   GFRNONAA 70 05/30/2016 0943   GFRAA 93 07/10/2018 1226   GFRAA 81 05/30/2016 0943   Renal function: CrCl cannot be calculated (Patient's most recent lab result is older than the maximum 21 days allowed.).  Clinical ASCVD: No hx of stroke or ACS/NSTEMI/STEMI however pt has hx of abdominal aortic atherosclerosis. Last LDL 38 (12/2017).   A/P: Hypertension longstanding currently uncontrolled but improved since starting carvedilol in place of atenolol. BP Goal  <130/80 mmHg. Patient is adherent with current medications.   I recommend  to add spironolactone for treatment resistant hypertension; labs reviewed and K noted to be low at 3.4. Will have pt return in 1 week for labs-only appointment, and I will follow-up with him in 1 month.   -Continued current regimen.  -Add spironolactone 25 mg daily. -F/u labs ordered - BMP future -Counseled on lifestyle modifications for blood pressure control including reduced dietary sodium, increased exercise, adequate sleep  Results reviewed and written information provided. Total time in face-to-face counseling 15 minutes.   F/U Clinic Visit in 1 month. Labs in 1 week to evaluate electrolyte status and renal function.   Pt seen with:  Beckey Rutter, PharmD Candidate  [employer]  [employer] 2022  [place], PharmD, [place] [phone]

## 2018-08-24 NOTE — Patient Instructions (Signed)
Thank you for coming to see [place] today.   Blood pressure today is elevated.   Take 1 tablet of carvedilol in the morning.  Take 1 tablet of HCTZ in the morning.  Take 1 tablet of spironolactone in the morning.   Take 1 tablet of carvedilol in the evening.  Take 1 tablet of amlodipine in the evening.   Limiting salt and caffeine, as well as exercising as able for at least 30 minutes for 5 days out of the week, can also help you lower your blood pressure.  Take your blood pressure at home if you are able. Please write down these numbers and bring them to your visits.  If you have any questions about medications, please call me [phone].  Lurena Joiner

## 2018-08-31 ENCOUNTER — Ambulatory Visit: Payer: Medicare Other | Attending: Internal Medicine

## 2018-08-31 DIAGNOSIS — I10 Essential (primary) hypertension: Secondary | ICD-10-CM

## 2018-09-01 LAB — BASIC METABOLIC PANEL
BUN/Creatinine Ratio: 15 (ref 10–24)
BUN: 15 mg/dL (ref 8–27)
CO2: 21 mmol/L (ref 20–29)
Calcium: 10.2 mg/dL (ref 8.6–10.2)
Chloride: 102 mmol/L (ref 96–106)
Creatinine, Ser: 1 mg/dL (ref 0.76–1.27)
GFR calc Af Amer: 90 mL/min/{1.73_m2} (ref >59)
GFR calc non Af Amer: 78 mL/min/{1.73_m2} (ref >59)
Glucose: 152 mg/dL — ABNORMAL HIGH (ref 65–99)
Potassium: 3.8 mmol/L (ref 3.5–5.2)
Sodium: 141 mmol/L (ref 134–144)

## 2018-09-10 ENCOUNTER — Telehealth: Payer: Self-pay

## 2018-09-10 NOTE — Telephone Encounter (Signed)
Contacted pt to go over lab results pt is aware and doesn't have any questions or concerns 

## 2018-10-16 ENCOUNTER — Ambulatory Visit: Payer: Medicare Other | Attending: Internal Medicine | Admitting: Internal Medicine

## 2018-10-16 ENCOUNTER — Encounter: Payer: Self-pay | Admitting: Internal Medicine

## 2018-10-16 ENCOUNTER — Ambulatory Visit: Payer: Medicare Other | Admitting: Internal Medicine

## 2018-10-16 ENCOUNTER — Other Ambulatory Visit: Payer: Self-pay

## 2018-10-16 DIAGNOSIS — I712 Thoracic aortic aneurysm, without rupture, unspecified: Secondary | ICD-10-CM

## 2018-10-16 DIAGNOSIS — I10 Essential (primary) hypertension: Secondary | ICD-10-CM | POA: Diagnosis not present

## 2018-10-16 DIAGNOSIS — M109 Gout, unspecified: Secondary | ICD-10-CM | POA: Diagnosis not present

## 2018-10-16 DIAGNOSIS — F17200 Nicotine dependence, unspecified, uncomplicated: Secondary | ICD-10-CM | POA: Diagnosis not present

## 2018-10-16 DIAGNOSIS — F17210 Nicotine dependence, cigarettes, uncomplicated: Secondary | ICD-10-CM

## 2018-10-16 MED ORDER — AMLODIPINE BESYLATE 10 MG PO TABS: 10.0000 mg | ORAL_TABLET | Freq: Every day | ORAL | 3 refills | Status: AC

## 2018-10-16 MED ORDER — COLCHICINE 0.6 MG PO TABS: 0.6000 mg | ORAL_TABLET | Freq: Every day | ORAL | 3 refills | Status: DC

## 2018-10-16 MED ORDER — POTASSIUM CHLORIDE CRYS ER 20 MEQ PO TBCR: 20.0000 meq | EXTENDED_RELEASE_TABLET | Freq: Every day | ORAL | 3 refills | Status: DC

## 2018-10-16 MED ORDER — PREDNISONE 20 MG PO TABS: ORAL_TABLET | ORAL | 0 refills | Status: DC

## 2018-10-16 MED ORDER — SPIRONOLACTONE 25 MG PO TABS: 25.0000 mg | ORAL_TABLET | Freq: Every day | ORAL | 3 refills | Status: AC

## 2018-10-16 MED ORDER — CARVEDILOL 6.25 MG PO TABS: 6.2500 mg | ORAL_TABLET | Freq: Two times a day (BID) | ORAL | 3 refills | Status: DC

## 2018-10-16 MED ORDER — VARENICLINE TARTRATE 0.5 MG X 11 & 1 MG X 42 PO MISC: ORAL | 0 refills | Status: DC

## 2018-10-16 MED ORDER — HYDROCHLOROTHIAZIDE 25 MG PO TABS: 25.0000 mg | ORAL_TABLET | Freq: Every day | ORAL | 3 refills | Status: DC

## 2018-10-16 MED ORDER — VARENICLINE TARTRATE 1 MG PO TABS: 1.0000 mg | ORAL_TABLET | Freq: Two times a day (BID) | ORAL | 1 refills | Status: DC

## 2018-10-16 NOTE — Progress Notes (Signed)
Virtual Visit via Telephone Note  I connected with Steven Delacruz on 10/16/18 at 2:18 p.m by telephone from my office and verified that I am speaking with the correct person using two identifiers.  Patient is at home.  Only the patient and myself participated in this telephone encounter.   I discussed the limitations, risks, security and privacy concerns of performing an evaluation and management service by telephone and the availability of in person appointments. I also discussed with the patient that there may be a patient responsible charge related to this service. The patient expressed understanding and agreed to proceed.   History of Present Illness: HTN, HL, tob dep, chronic LBP and RT shoulder pain, remote MI, thoracic aortic aneurysm 3.3 cm in 2016, gout.  Patient last seen in January 2020.  Pt c/o flare of gout in both feet x 2 wks started on LT first.  LT foot is swollen and RT foot just starting to swell. + pain.  Out of Colchicine for a few wks.  HTN:  Has a device to check BP but has not been checking.  Limits salt in foods. No CP. Endorses SOB with exertion which he states is chronic but seems to be progressive.  No LE edema except in his feet now due to gout.  Sleeps on 2 pillows.  No orthopnea.  No cough  Tob dep:  On last visit, he was down to 1 cig/day and had planned to quit by his BD.  However, he started smoking more due to being anxious about the COVID-19 pandemic.  Now smoking 1/2 pk a day. Would like to try Chantix again  Thoracic aortic aneurysm:  CTA ordered on last visit.  Pt states he went to the appt but he waited too long and had to leave because he had other things to do.  They told him they would reschedule but pt states they never did  Observations/Objective: No direct observations done as this was a telephone encounter visit  Assessment and Plan: 1. Acute gout of multiple sites, unspecified cause Refill colchicine which he takes daily for prophylaxis.  We  will also prescribe prednisone to address this acute flare - colchicine 0.6 MG tablet; Take 1 tablet (0.6 mg total) by mouth daily.  Dispense: 90 tablet; Refill: 3 - predniSONE (DELTASONE) 20 MG tablet; 1.5 tabs PO daily x 3 days then 1 tab Po daily x 3 days then 1/2 tab daily x 3 days  Dispense: 9 tablet; Refill: 0  2. Essential hypertension Encourage patient to check his blood pressure at least twice a week since he does have a monitoring device.  Went over goal of 130/80 or lower.  Continue current medications - amLODipine (NORVASC) 10 MG tablet; Take 1 tablet (10 mg total) by mouth daily.  Dispense: 90 tablet; Refill: 3 - carvedilol (COREG) 6.25 MG tablet; Take 1 tablet (6.25 mg total) by mouth 2 (two) times daily with a meal.  Dispense: 180 tablet; Refill: 3 - hydrochlorothiazide (HYDRODIURIL) 25 MG tablet; Take 1 tablet (25 mg total) by mouth daily.  Dispense: 90 tablet; Refill: 3 - potassium chloride SA (K-DUR) 20 MEQ tablet; Take 1 tablet (20 mEq total) by mouth daily.  Dispense: 90 tablet; Refill: 3 - spironolactone (ALDACTONE) 25 MG tablet; Take 1 tablet (25 mg total) by mouth daily.  Dispense: 90 tablet; Refill: 3  3. Tobacco dependence Patient advised to quit smoking. Discussed health risks associated with smoking including lung and other types of cancers, chronic lung diseases  and CV risks.. Pt ready to give trail of quitting.  Discussed methods to help quit including quitting cold [place], use of NRT, Chantix and Bupropion.  Patient wanting to try Chantix again. Less than 5-minute spent on counseling. - varenicline (CHANTIX STARTING MONTH PAK) 0.5 MG X 11 & 1 MG X 42 tablet; one 0.5 mg tab PO once daily for 3 days, then increase to one 0.5 mg tab BID for 4 days, then increase to one 1 mg tablet twice daily.  Dispense: 53 tablet; Refill: 0 - varenicline (CHANTIX CONTINUING MONTH PAK) 1 MG tablet; Take 1 tablet (1 mg total) by mouth 2 (two) times daily.  Dispense: 60 tablet; Refill:  1  4. Thoracic aortic aneurysm without rupture [facility]) We will have my CMA reschedule the CTA for him   Follow Up Instructions: Follow-up in 2 months   I discussed the assessment and treatment plan with the patient. The patient was provided an opportunity to ask questions and all were answered. The patient agreed with the plan and demonstrated an understanding of the instructions.   The patient was advised to call back or seek an in-person evaluation if the symptoms worsen or if the condition fails to improve as anticipated.  I provided 16 minutes of non-face-to-face time during this encounter.   Karle Plumber, MD

## 2018-10-16 NOTE — Progress Notes (Signed)
Pt states he having pain in his feet  Pt states his gout came back

## 2018-10-27 ENCOUNTER — Telehealth: Payer: Self-pay

## 2018-10-27 DIAGNOSIS — I10 Essential (primary) hypertension: Secondary | ICD-10-CM

## 2018-10-27 NOTE — Telephone Encounter (Signed)
Contacted pt and left a detailed vm informing pt of CT appointment and lab appointment  CT is schedule for December 08, 2018 at [facility] at 330pm. Pt is to arrive at 315pm. Pt is to drink only water 4 hours prior.   Also made pt aware that his lab appointment is schedule for December 04, 2018 at 2pm.  Dr. Wynetta Emery pt is needing blood work prior to appt for CT. I have already scheduled appointment for lab. Would you be able to put in future order for blood work?

## 2018-11-16 ENCOUNTER — Other Ambulatory Visit: Payer: Self-pay | Admitting: Internal Medicine

## 2018-11-16 DIAGNOSIS — I10 Essential (primary) hypertension: Secondary | ICD-10-CM

## 2018-12-04 ENCOUNTER — Ambulatory Visit: Payer: Medicare Other | Attending: Family Medicine

## 2018-12-04 ENCOUNTER — Other Ambulatory Visit: Payer: Self-pay

## 2018-12-04 DIAGNOSIS — I10 Essential (primary) hypertension: Secondary | ICD-10-CM

## 2018-12-05 LAB — BASIC METABOLIC PANEL
BUN/Creatinine Ratio: 8 — ABNORMAL LOW (ref 10–24)
BUN: 8 mg/dL (ref 8–27)
CO2: 18 mmol/L — ABNORMAL LOW (ref 20–29)
Calcium: 9.7 mg/dL (ref 8.6–10.2)
Chloride: 100 mmol/L (ref 96–106)
Creatinine, Ser: 1.04 mg/dL (ref 0.76–1.27)
GFR calc Af Amer: 86 mL/min/{1.73_m2} (ref >59)
GFR calc non Af Amer: 74 mL/min/{1.73_m2} (ref >59)
Glucose: 116 mg/dL — ABNORMAL HIGH (ref 65–99)
Potassium: 4.1 mmol/L (ref 3.5–5.2)
Sodium: 138 mmol/L (ref 134–144)

## 2018-12-08 ENCOUNTER — Ambulatory Visit (HOSPITAL_COMMUNITY): Admission: RE | Admit: 2018-12-08 | Payer: Medicare Other | Source: Ambulatory Visit

## 2018-12-14 ENCOUNTER — Ambulatory Visit: Payer: Medicare Other | Attending: Internal Medicine | Admitting: Internal Medicine

## 2018-12-14 ENCOUNTER — Other Ambulatory Visit: Payer: Self-pay

## 2018-12-14 ENCOUNTER — Encounter: Payer: Self-pay | Admitting: Internal Medicine

## 2018-12-14 DIAGNOSIS — M10072 Idiopathic gout, left ankle and foot: Secondary | ICD-10-CM

## 2018-12-14 DIAGNOSIS — I712 Thoracic aortic aneurysm, without rupture, unspecified: Secondary | ICD-10-CM

## 2018-12-14 DIAGNOSIS — M109 Gout, unspecified: Secondary | ICD-10-CM

## 2018-12-14 DIAGNOSIS — F17210 Nicotine dependence, cigarettes, uncomplicated: Secondary | ICD-10-CM | POA: Diagnosis not present

## 2018-12-14 DIAGNOSIS — I10 Essential (primary) hypertension: Secondary | ICD-10-CM | POA: Diagnosis not present

## 2018-12-14 DIAGNOSIS — F17200 Nicotine dependence, unspecified, uncomplicated: Secondary | ICD-10-CM

## 2018-12-14 MED ORDER — CARVEDILOL 6.25 MG PO TABS: 12.5000 mg | ORAL_TABLET | Freq: Two times a day (BID) | ORAL | 3 refills | Status: DC

## 2018-12-14 MED ORDER — PREDNISONE 20 MG PO TABS: ORAL_TABLET | ORAL | 0 refills | Status: DC

## 2018-12-14 NOTE — Progress Notes (Signed)
Pt states he has pain coming from left foot his gout flared up again

## 2018-12-14 NOTE — Progress Notes (Signed)
Virtual Visit via Telephone Note  I connected with Steven Delacruz on 12/14/18 at 3:29 p.m EDT by telephone and verified that I am speaking with the correct person using two identifiers.   I discussed the limitations, risks, security and privacy concerns of performing an evaluation and management service by telephone and the availability of in person appointments. I also discussed with the patient that there may be a patient responsible charge related to this service. The patient expressed understanding and agreed to proceed.   History of Present Illness: HTN, HL, tob dep, chronic LBP and RT shoulder pain, remote MI, thoracic aortic aneurysm 3.3 cm in 2016, gout.  Patient last eval 09/2018.   C/o of flare of gout in LT foot since weekend.   Foot is painful and swollen.  No redness Taking Colchicine.  Took Some Ibuprofen 200 mg 4 tabs BID, foot is a little better  Thoracic Aortic Aneurysm: Patient no showed the appointment for his CT angiogram that was scheduled for the ninth of this month.  My CMA had left a voicemail message informing him of the date and time of the appointment.  However patient never checked his voicemail.  He would like to have it rescheduled.    Tob dep:  Never started the Chantix but plans to start it   HTN:  Checks BP "when I think about it."  Last checked last wk was 144/78 Trying to limit salt in the foods. I had him take out all of his medication bottles and run through his med list with him to do a medication reconciliation.  He is taking everything on his med list including carvedilol, spironolactone, HCTZ, potassium supplement and amlodipine  Observations/Objective: No direct observation   Assessment and Plan: 1. Acute gout of left foot, unspecified cause Continue colchicine. Inform patient that HCTZ can increase gout flareups.  We will try taking him off of this and increase the carvedilol instead. Advised to stop HCTZ and potassium. - predniSONE  (DELTASONE) 20 MG tablet; 1.5 tabs PO daily x 3 days then 1 tab Po daily x 3 days then 1/2 tab daily x 3 days  Dispense: 9 tablet; Refill: 0 - Hepatic Function Panel; Future - Uric Acid; Future  2. Essential hypertension Stop HCTZ and potassium.  Increase carvedilol.  He tells me that he just got a 77-month supply of the Coreg 6.25 mg which he has been taking twice a day.  I told him to start taking 2 tablets twice a day until he runs out of his current bottle Follow-up with the clinical pharmacist in 1 month for repeat blood pressure check - Lipid panel; Future - Hepatic Function Panel; Future  3. Tobacco dependence Advised to quit.  He plans to start the Chantix which was prescribed on last visit.  Less than 5 minutes spent on counseling - Lipid panel; Future  4. Thoracic aortic aneurysm without rupture ([place]) We will reschedule the CT angiogram.  Encouraged him to keep the appointment and to check his voicemail for messages - CT Angio Chest W/Cm &/Or Wo Cm; Future   Follow Up Instructions: F/u in 3 mths with me F/u in 1 mth with Clinical pharmacist for BP recheck   I discussed the assessment and treatment plan with the patient. The patient was provided an opportunity to ask questions and all were answered. The patient agreed with the plan and demonstrated an understanding of the instructions.   The patient was advised to call back or seek an in-person  evaluation if the symptoms worsen or if the condition fails to improve as anticipated.  I provided 23 minutes of non-face-to-face time during this encounter.   Karle Plumber, MD

## 2018-12-15 ENCOUNTER — Telehealth: Payer: Self-pay | Admitting: Internal Medicine

## 2018-12-15 NOTE — Telephone Encounter (Signed)
LVM to contact patient to schedule the following appts  Please contact pt and schedule 3 month f/u   Please schedule 1 month bp check in person with luke

## 2019-06-12 ENCOUNTER — Other Ambulatory Visit: Payer: Self-pay | Admitting: Internal Medicine

## 2019-06-12 DIAGNOSIS — I10 Essential (primary) hypertension: Secondary | ICD-10-CM

## 2019-06-15 ENCOUNTER — Other Ambulatory Visit: Payer: Self-pay | Admitting: Internal Medicine

## 2019-06-15 DIAGNOSIS — I10 Essential (primary) hypertension: Secondary | ICD-10-CM

## 2019-07-09 ENCOUNTER — Other Ambulatory Visit: Payer: Self-pay | Admitting: Internal Medicine

## 2019-07-09 DIAGNOSIS — I10 Essential (primary) hypertension: Secondary | ICD-10-CM

## 2019-08-03 ENCOUNTER — Other Ambulatory Visit: Payer: Self-pay | Admitting: Internal Medicine

## 2019-08-03 DIAGNOSIS — I10 Essential (primary) hypertension: Secondary | ICD-10-CM

## 2019-10-29 ENCOUNTER — Other Ambulatory Visit: Payer: Self-pay | Admitting: Internal Medicine

## 2019-10-29 DIAGNOSIS — I10 Essential (primary) hypertension: Secondary | ICD-10-CM

## 2019-12-14 ENCOUNTER — Other Ambulatory Visit: Payer: Self-pay | Admitting: Internal Medicine

## 2019-12-14 DIAGNOSIS — I10 Essential (primary) hypertension: Secondary | ICD-10-CM

## 2019-12-31 ENCOUNTER — Other Ambulatory Visit: Payer: Self-pay | Admitting: Internal Medicine

## 2019-12-31 DIAGNOSIS — I10 Essential (primary) hypertension: Secondary | ICD-10-CM

## 2020-01-07 ENCOUNTER — Other Ambulatory Visit: Payer: Self-pay

## 2020-01-07 ENCOUNTER — Encounter: Payer: Self-pay | Admitting: Podiatry

## 2020-01-07 ENCOUNTER — Ambulatory Visit: Payer: Medicare Other | Admitting: Podiatry

## 2020-01-07 DIAGNOSIS — M79674 Pain in right toe(s): Secondary | ICD-10-CM | POA: Diagnosis not present

## 2020-01-07 DIAGNOSIS — M79675 Pain in left toe(s): Secondary | ICD-10-CM | POA: Diagnosis not present

## 2020-01-07 DIAGNOSIS — B351 Tinea unguium: Secondary | ICD-10-CM | POA: Diagnosis not present

## 2020-01-07 NOTE — Progress Notes (Signed)
This patient returns to the office for evaluation and treatment of long thick painful nails .  This patient is unable to trim his own nails since the patient cannot reach his feet.  Patient says the nails are painful walking and wearing his shoes.  He returns for preventive foot care services. Patient has not been seen in over 3 years.  General Appearance  Alert, conversant and in no acute stress.  Vascular  Dorsalis pedis and posterior tibial  pulses are palpable  bilaterally.  Capillary return is within normal limits  bilaterally. Temperature is within normal limits  bilaterally.  Neurologic  Senn-Weinstein monofilament wire test within normal limits  bilaterally. Muscle power within normal limits bilaterally.  Nails Thick disfigured discolored nails with subungual debris  from hallux to fifth toes bilaterally. No evidence of bacterial infection or drainage bilaterally.  Orthopedic  No limitations of motion  feet .  No crepitus or effusions noted.  No bony pathology or digital deformities noted.  Skin  normotropic skin with no porokeratosis noted bilaterally.  No signs of infections or ulcers noted.     Onychomycosis  Pain in toes right foot  Pain in toes left foot  Debridement  of nails  1-5  B/L with a nail nipper.  Nails were then filed using a dremel tool with no incidents.    RTC 3 months    Gardiner Barefoot DPM

## 2020-03-07 ENCOUNTER — Other Ambulatory Visit: Payer: Self-pay | Admitting: General Practice

## 2020-03-07 DIAGNOSIS — J439 Emphysema, unspecified: Secondary | ICD-10-CM

## 2020-03-15 ENCOUNTER — Other Ambulatory Visit: Payer: Self-pay | Admitting: General Practice

## 2020-03-15 DIAGNOSIS — I10 Essential (primary) hypertension: Secondary | ICD-10-CM

## 2020-03-15 DIAGNOSIS — E278 Other specified disorders of adrenal gland: Secondary | ICD-10-CM

## 2020-03-15 DIAGNOSIS — K860 Alcohol-induced chronic pancreatitis: Secondary | ICD-10-CM

## 2020-03-15 DIAGNOSIS — J439 Emphysema, unspecified: Secondary | ICD-10-CM

## 2020-03-16 ENCOUNTER — Ambulatory Visit
Admission: RE | Admit: 2020-03-16 | Discharge: 2020-03-16 | Disposition: A | Discharge status: Home / Self Care | Payer: Medicare Other | Source: Ambulatory Visit | Attending: General Practice | Admitting: General Practice

## 2020-03-16 ENCOUNTER — Other Ambulatory Visit: Payer: Self-pay

## 2020-03-16 DIAGNOSIS — K860 Alcohol-induced chronic pancreatitis: Secondary | ICD-10-CM

## 2020-03-16 DIAGNOSIS — J439 Emphysema, unspecified: Secondary | ICD-10-CM

## 2020-03-16 DIAGNOSIS — I10 Essential (primary) hypertension: Secondary | ICD-10-CM

## 2020-03-16 DIAGNOSIS — E278 Other specified disorders of adrenal gland: Secondary | ICD-10-CM

## 2020-03-16 MED ORDER — IOPAMIDOL (ISOVUE-300) INJECTION 61%
100.0000 mL | Freq: Once | INTRAVENOUS | Status: AC | PRN
  Administered 2020-03-16: 100 mL via INTRAVENOUS

## 2020-04-05 ENCOUNTER — Other Ambulatory Visit: Payer: Self-pay

## 2020-04-05 ENCOUNTER — Ambulatory Visit (INDEPENDENT_AMBULATORY_CARE_PROVIDER_SITE_OTHER): Payer: Medicare HMO | Admitting: Podiatry

## 2020-04-05 ENCOUNTER — Encounter: Payer: Self-pay | Admitting: Podiatry

## 2020-04-05 DIAGNOSIS — B351 Tinea unguium: Secondary | ICD-10-CM | POA: Diagnosis not present

## 2020-04-05 DIAGNOSIS — M79675 Pain in left toe(s): Secondary | ICD-10-CM | POA: Diagnosis not present

## 2020-04-05 DIAGNOSIS — M79674 Pain in right toe(s): Secondary | ICD-10-CM | POA: Diagnosis not present

## 2020-04-05 NOTE — Progress Notes (Signed)
This patient returns to the office for evaluation and treatment of long thick painful nails .  This patient is unable to trim his own nails since the patient cannot reach his feet.  Patient says the nails are painful walking and wearing his shoes.  He returns for preventive foot care services. Patient has not been seen in over 3 years.  General Appearance  Alert, conversant and in no acute stress.  Vascular  Dorsalis pedis and posterior tibial  pulses are palpable  bilaterally.  Capillary return is within normal limits  bilaterally. Temperature is within normal limits  bilaterally.  Neurologic  Senn-Weinstein monofilament wire test diminished   bilaterally. Muscle power within normal limits bilaterally.  Nails Thick disfigured discolored nails with subungual debris  from hallux to fifth toes bilaterally. No evidence of bacterial infection or drainage bilaterally.  Orthopedic  No limitations of motion  feet .  No crepitus or effusions noted.  No bony pathology or digital deformities noted.  HAV  B/L.  Skin  normotropic skin with no porokeratosis noted bilaterally.  No signs of infections or ulcers noted.     Onychomycosis  Pain in toes right foot  Pain in toes left foot  Debridement  of nails  1-5  B/L with a nail nipper.  Nails were then filed using a dremel tool with no incidents.    RTC 3 months    Gardiner Barefoot DPM

## 2020-07-05 ENCOUNTER — Ambulatory Visit: Payer: Medicare HMO | Admitting: Podiatry

## 2021-03-12 ENCOUNTER — Ambulatory Visit
Admission: RE | Admit: 2021-03-12 | Discharge: 2021-03-12 | Disposition: A | Discharge status: Home / Self Care | Payer: Medicare Other | Source: Ambulatory Visit | Attending: Family Medicine | Admitting: Family Medicine

## 2021-03-12 ENCOUNTER — Other Ambulatory Visit: Payer: Self-pay | Admitting: Family Medicine

## 2021-03-12 ENCOUNTER — Other Ambulatory Visit: Payer: Self-pay

## 2021-03-12 DIAGNOSIS — M25561 Pain in right knee: Secondary | ICD-10-CM

## 2021-04-27 ENCOUNTER — Other Ambulatory Visit: Payer: Self-pay | Admitting: Family Medicine

## 2021-04-27 DIAGNOSIS — J439 Emphysema, unspecified: Secondary | ICD-10-CM

## 2021-04-27 DIAGNOSIS — R911 Solitary pulmonary nodule: Secondary | ICD-10-CM

## 2021-04-30 ENCOUNTER — Ambulatory Visit
Admission: RE | Admit: 2021-04-30 | Discharge: 2021-04-30 | Disposition: A | Discharge status: Home / Self Care | Payer: Medicare Other | Source: Ambulatory Visit | Attending: Family Medicine | Admitting: Family Medicine

## 2021-04-30 ENCOUNTER — Other Ambulatory Visit: Payer: Self-pay | Admitting: Family Medicine

## 2021-04-30 ENCOUNTER — Other Ambulatory Visit: Payer: Self-pay

## 2021-04-30 DIAGNOSIS — M25511 Pain in right shoulder: Secondary | ICD-10-CM

## 2021-05-05 ENCOUNTER — Ambulatory Visit
Admission: RE | Admit: 2021-05-05 | Discharge: 2021-05-05 | Disposition: A | Discharge status: Home / Self Care | Payer: Medicare Other | Source: Ambulatory Visit | Attending: Family Medicine | Admitting: Family Medicine

## 2021-05-05 DIAGNOSIS — R911 Solitary pulmonary nodule: Secondary | ICD-10-CM

## 2021-05-05 DIAGNOSIS — J439 Emphysema, unspecified: Secondary | ICD-10-CM

## 2021-12-21 ENCOUNTER — Emergency Department (HOSPITAL_COMMUNITY): Payer: Medicare Other

## 2021-12-21 ENCOUNTER — Emergency Department (HOSPITAL_COMMUNITY)
Admission: EM | Admit: 2021-12-21 | Discharge: 2021-12-22 | Disposition: A | Discharge status: Home / Self Care | Payer: Medicare Other | Attending: Emergency Medicine | Admitting: Emergency Medicine

## 2021-12-21 ENCOUNTER — Encounter (HOSPITAL_COMMUNITY): Payer: Self-pay

## 2021-12-21 ENCOUNTER — Other Ambulatory Visit: Payer: Self-pay

## 2021-12-21 DIAGNOSIS — Z20822 Contact with and (suspected) exposure to covid-19: Secondary | ICD-10-CM | POA: Diagnosis not present

## 2021-12-21 DIAGNOSIS — E279 Disorder of adrenal gland, unspecified: Secondary | ICD-10-CM | POA: Insufficient documentation

## 2021-12-21 DIAGNOSIS — I6782 Cerebral ischemia: Secondary | ICD-10-CM | POA: Insufficient documentation

## 2021-12-21 DIAGNOSIS — I10 Essential (primary) hypertension: Secondary | ICD-10-CM | POA: Insufficient documentation

## 2021-12-21 DIAGNOSIS — M545 Low back pain, unspecified: Secondary | ICD-10-CM | POA: Insufficient documentation

## 2021-12-21 DIAGNOSIS — Y92410 Unspecified street and highway as the place of occurrence of the external cause: Secondary | ICD-10-CM | POA: Insufficient documentation

## 2021-12-21 DIAGNOSIS — Z79899 Other long term (current) drug therapy: Secondary | ICD-10-CM | POA: Insufficient documentation

## 2021-12-21 DIAGNOSIS — I2510 Atherosclerotic heart disease of native coronary artery without angina pectoris: Secondary | ICD-10-CM | POA: Insufficient documentation

## 2021-12-21 DIAGNOSIS — R0789 Other chest pain: Secondary | ICD-10-CM | POA: Diagnosis not present

## 2021-12-21 DIAGNOSIS — I700 Atherosclerosis of aorta: Secondary | ICD-10-CM | POA: Insufficient documentation

## 2021-12-21 DIAGNOSIS — J439 Emphysema, unspecified: Secondary | ICD-10-CM | POA: Insufficient documentation

## 2021-12-21 DIAGNOSIS — M47812 Spondylosis without myelopathy or radiculopathy, cervical region: Secondary | ICD-10-CM | POA: Insufficient documentation

## 2021-12-21 LAB — I-STAT CHEM 8, ED
BUN: 14 mg/dL (ref 8–23)
Calcium, Ion: 1.22 mmol/L (ref 1.15–1.40)
Chloride: 109 mmol/L (ref 98–111)
Creatinine, Ser: 1.3 mg/dL — ABNORMAL HIGH (ref 0.61–1.24)
Glucose, Bld: 120 mg/dL — ABNORMAL HIGH (ref 70–99)
HCT: 46 % (ref 39.0–52.0)
Hemoglobin: 15.6 g/dL (ref 13.0–17.0)
Potassium: 3.2 mmol/L — ABNORMAL LOW (ref 3.5–5.1)
Sodium: 143 mmol/L (ref 135–145)
TCO2: 24 mmol/L (ref 22–32)
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
## 2021-12-22 ENCOUNTER — Emergency Department (HOSPITAL_COMMUNITY): Payer: Medicare Other

## 2021-12-22 LAB — COMPREHENSIVE METABOLIC PANEL
ALT: 18 U/L (ref 0–44)
AST: 21 U/L (ref 15–41)
Albumin: 3.9 g/dL (ref 3.5–5.0)
Alkaline Phosphatase: 70 U/L (ref 38–126)
Anion gap: 9 (ref 5–15)
BUN: 14 mg/dL (ref 8–23)
CO2: 25 mmol/L (ref 22–32)
Calcium: 9.5 mg/dL (ref 8.9–10.3)
Chloride: 111 mmol/L (ref 98–111)
Creatinine, Ser: 1.14 mg/dL (ref 0.61–1.24)
GFR, Estimated: >60 mL/min (ref >60)
Glucose, Bld: 124 mg/dL — ABNORMAL HIGH (ref 70–99)
Potassium: 3 mmol/L — ABNORMAL LOW (ref 3.5–5.1)
Sodium: 145 mmol/L (ref 135–145)
Total Bilirubin: 0.9 mg/dL (ref 0.3–1.2)
Total Protein: 7 g/dL (ref 6.5–8.1)

## 2021-12-22 LAB — CBC
HCT: 46.9 % (ref 39.0–52.0)
Hemoglobin: 15.5 g/dL (ref 13.0–17.0)
MCH: 32.6 pg (ref 26.0–34.0)
MCHC: 33 g/dL (ref 30.0–36.0)
MCV: 98.7 fL (ref 80.0–100.0)
Platelets: 183 10*3/uL (ref 150–400)
RBC: 4.75 MIL/uL (ref 4.22–5.81)
RDW: 14.4 % (ref 11.5–15.5)
WBC: 5.5 10*3/uL (ref 4.0–10.5)
nRBC: 0 % (ref 0.0–0.2)

## 2021-12-22 LAB — URINALYSIS, ROUTINE W REFLEX MICROSCOPIC
Bilirubin Urine: NEGATIVE
Glucose, UA: NEGATIVE mg/dL
Hgb urine dipstick: NEGATIVE
Ketones, ur: NEGATIVE mg/dL
Leukocytes,Ua: NEGATIVE
Nitrite: NEGATIVE
Protein, ur: NEGATIVE mg/dL
Specific Gravity, Urine: 1.002 — ABNORMAL LOW (ref 1.005–1.030)
pH: 6 (ref 5.0–8.0)

## 2021-12-22 LAB — RESP PANEL BY RT-PCR (FLU A&B, COVID) ARPGX2
Influenza A by PCR: NEGATIVE
Influenza B by PCR: NEGATIVE
SARS Coronavirus 2 by RT PCR: NEGATIVE

## 2021-12-22 LAB — RAPID URINE DRUG SCREEN, HOSP PERFORMED
Amphetamines: NOT DETECTED
Barbiturates: NOT DETECTED
Benzodiazepines: NOT DETECTED
Cocaine: NOT DETECTED
Opiates: NOT DETECTED
Tetrahydrocannabinol: POSITIVE — AB

## 2021-12-22 LAB — PROTIME-INR
INR: 1 (ref 0.8–1.2)
Prothrombin Time: 13.2 seconds (ref 11.4–15.2)

## 2021-12-22 LAB — SAMPLE TO BLOOD BANK

## 2021-12-22 LAB — ETHANOL: Alcohol, Ethyl (B): 273 mg/dL — ABNORMAL HIGH (ref <10)

## 2021-12-22 LAB — LACTIC ACID, PLASMA: Lactic Acid, Venous: 1.6 mmol/L (ref 0.5–1.9)

## 2021-12-22 MED ORDER — CYCLOBENZAPRINE HCL 10 MG PO TABS: 10.0000 mg | ORAL_TABLET | Freq: Three times a day (TID) | ORAL | 0 refills | Status: DC | PRN

## 2021-12-22 MED ORDER — IOHEXOL 300 MG/ML  SOLN
100.0000 mL | Freq: Once | INTRAMUSCULAR | Status: AC | PRN
Start: 2021-12-22 — End: 2021-12-22
  Administered 2021-12-22: 100 mL via INTRAVENOUS

## 2021-12-22 MED ORDER — CYCLOBENZAPRINE HCL 10 MG PO TABS: 10.0000 mg | ORAL_TABLET | Freq: Three times a day (TID) | ORAL | 0 refills | Status: AC | PRN

## 2021-12-22 NOTE — ED Provider Notes (Signed)
Patient signed out to me at shift change.  CT scans are reassuring.  No acute traumatic findings.  Patient is alert and oriented.  Seems clinically sober.  He states that he will call and find a ride home.   Roxy Horseman, PA-C 12/22/21 0434    Pollyann Savoy, MD 12/22/21 [phone]

## 2022-01-24 IMAGING — CT CT CHEST W/O CM
1 series · 15 of 34 positions shown, 19 images · non-contrast
Comparison: Comparison is made with examination from March 16, 2020.

CLINICAL DATA: A 56-year-old male presents with findings of lung
nodules on previous studies, history of pulmonary emphysema as well.

EXAM:
CT CHEST WITHOUT CONTRAST
TECHNIQUE: Multidetector CT imaging of the chest was performed following the
standard protocol without IV contrast.

[Series 2: chest w/(date) · axial · 0.80mm/px · z∈[-377,-69]mm · 15 of 182 slices shown, 19 images]
[im 14/182  mediastinal]
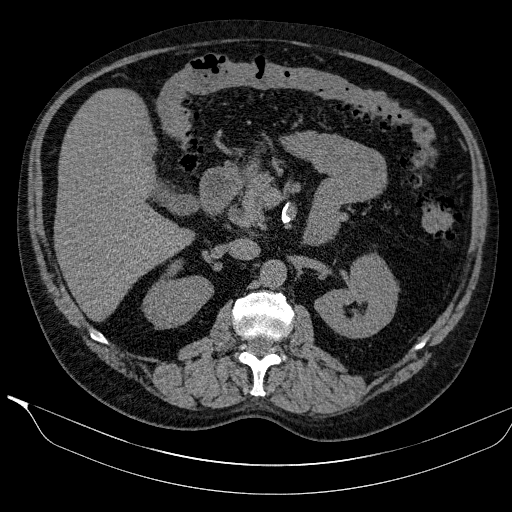
[im 14/182  lung]
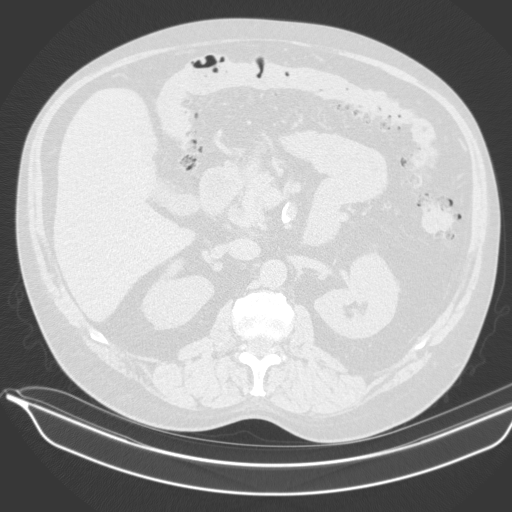
[im 27/182  lung]
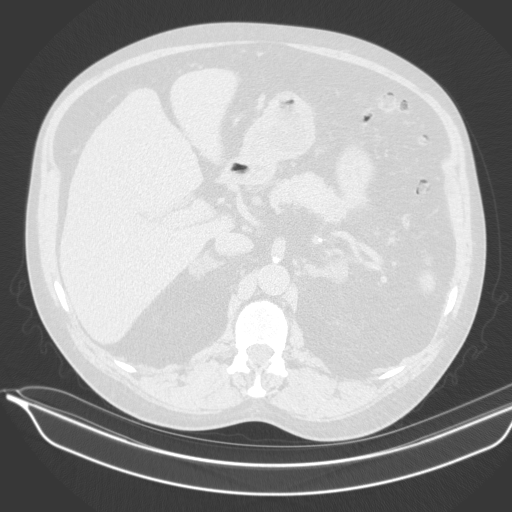
[im 37/182  lung]
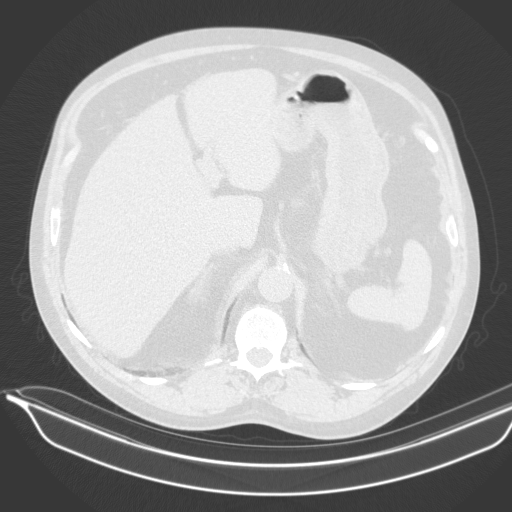
[im 47/182  lung]
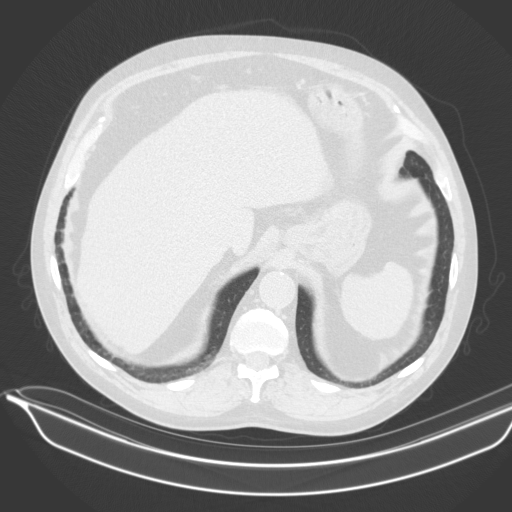
[im 61/182  mediastinal]
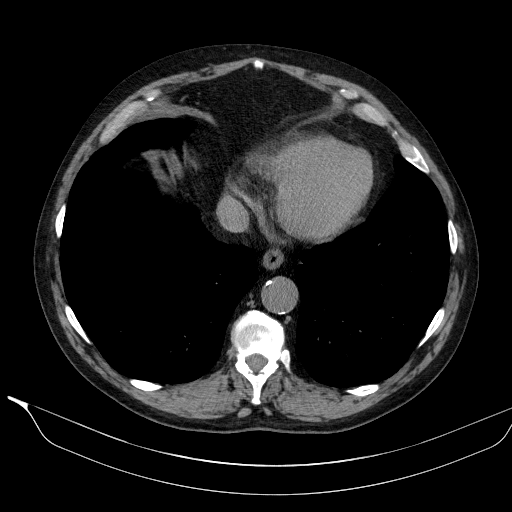
[im 61/182  lung]
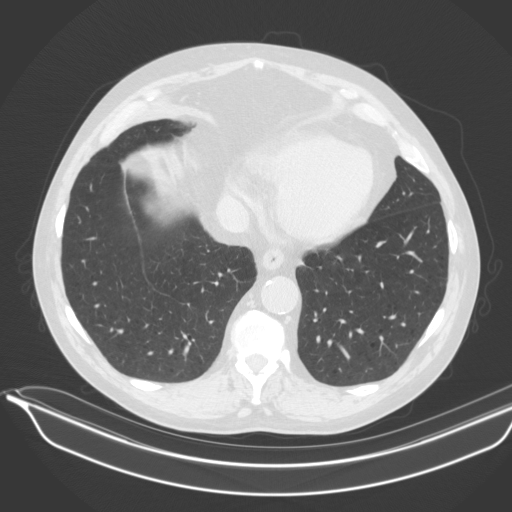
[im 73/182  lung]
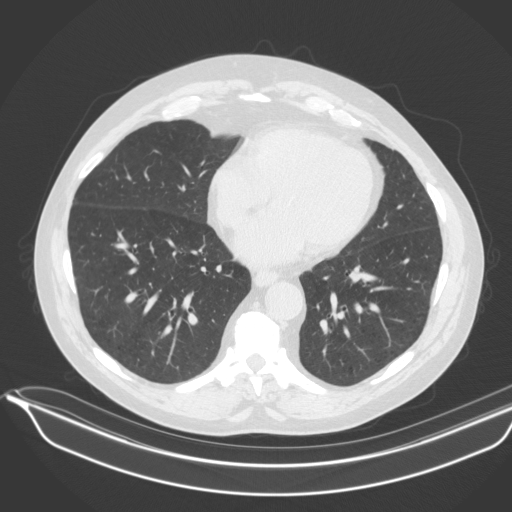
[im 81/182  lung]
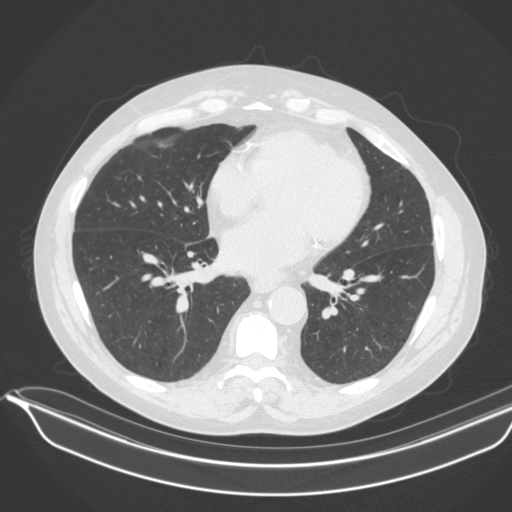
[im 94/182  lung]
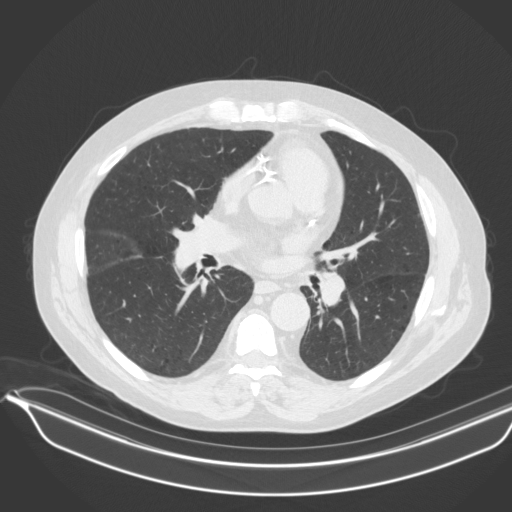
[im 101/182  mediastinal]
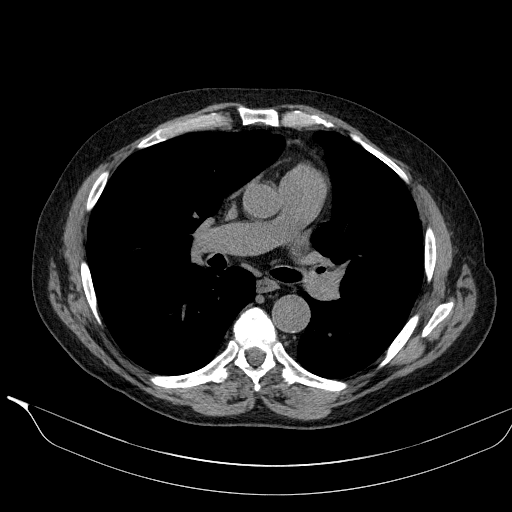
[im 101/182  lung]
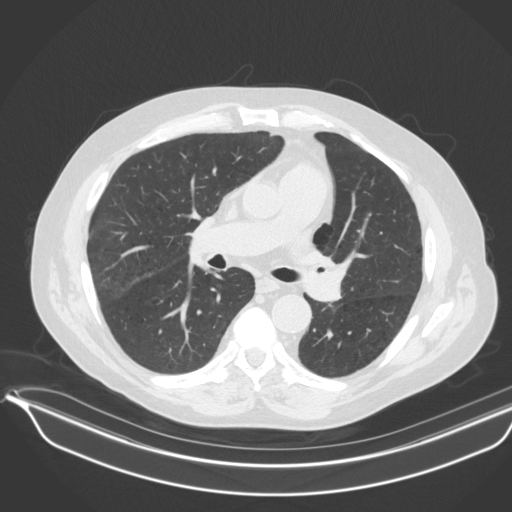
[im 109/182  lung]
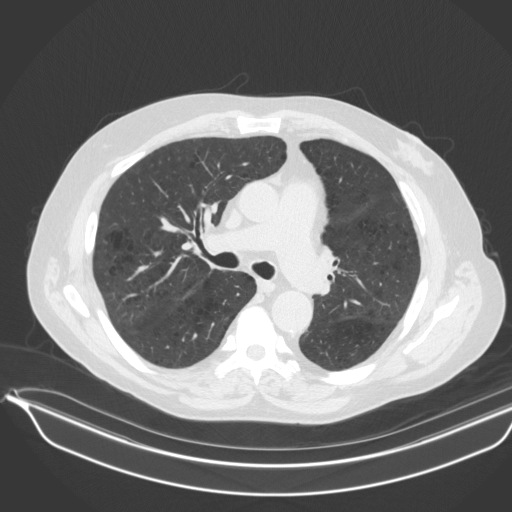
[im 121/182  lung]
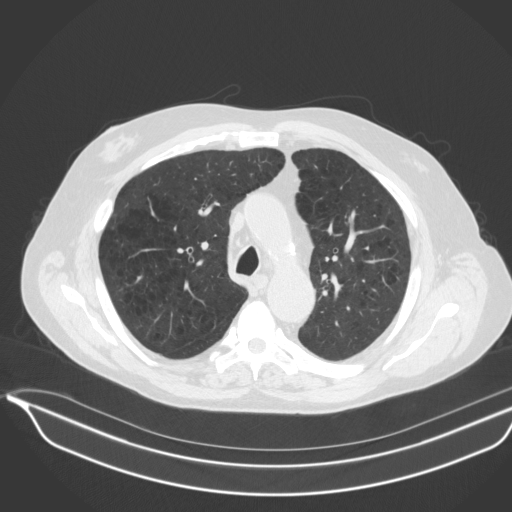
[im 135/182  lung]
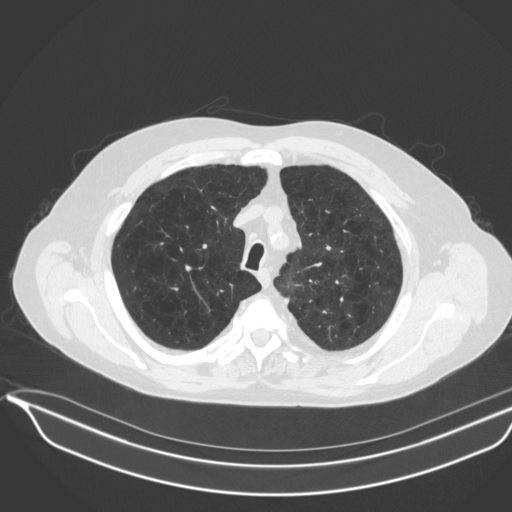
[im 145/182  mediastinal]
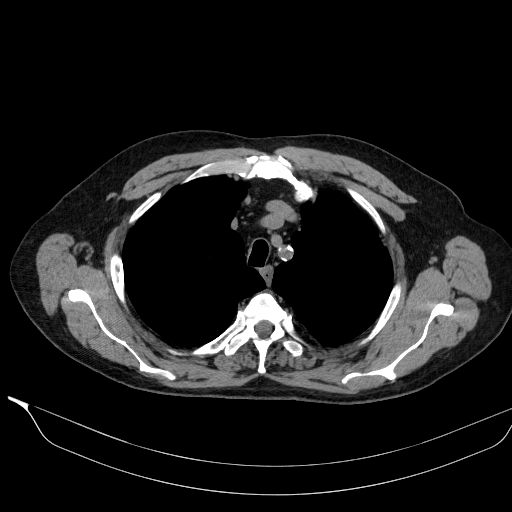
[im 145/182  lung]
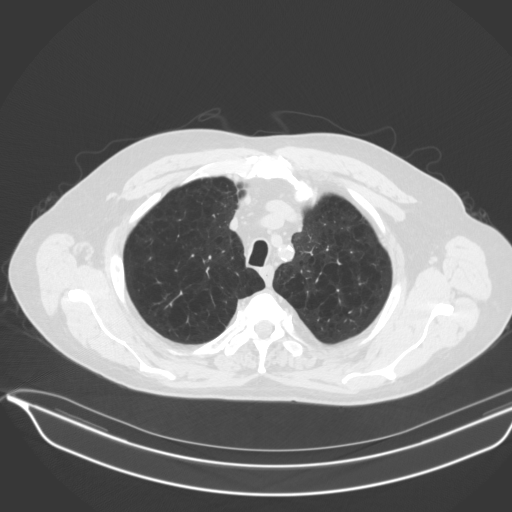
[im 155/182  lung]
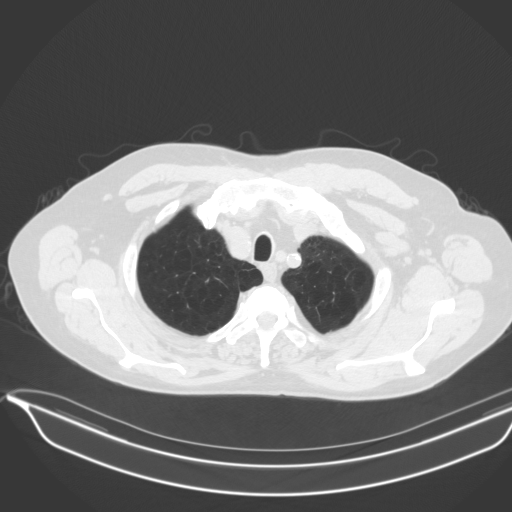
[im 168/182  lung]
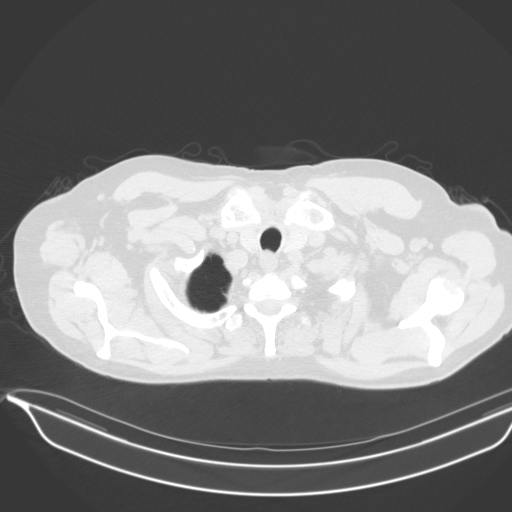

[15 of 34 positions shown; findings below may reference images not displayed]

FINDINGS: Cardiovascular: Calcified aortic atherosclerosis. No aneurysmal
dilation. Three-vessel coronary artery disease. Normal heart size.
Normal caliber of the central pulmonary vasculature.

Mediastinum/Nodes: No thoracic inlet, axillary, mediastinal or hilar
adenopathy. Esophagus grossly normal.

Lungs/Pleura: Marked pulmonary emphysema worse at the lung apices,
similar to previous imaging. Tiny RIGHT upper lobe pulmonary nodule
2 mm (image 76/5) this is unchanged. No effusion. No consolidative
process. Airways are patent.

Upper Abdomen: Imaged portions of the liver, gallbladder, pancreas,
spleen, adrenal glands and kidneys with stable appearance. Stable
RIGHT adrenal adenoma approximally 2 cm (image [DATE]) stable small
LEFT adrenal adenoma as well. Extensive colonic diverticulosis. No
upper abdominal lymphadenopathy.

Musculoskeletal: No acute bone finding. No destructive bone process.
Spinal degenerative changes.
IMPRESSION: Marked pulmonary emphysema worse at the lung apices, similar to
previous imaging.

Tiny 2 mm RIGHT upper lobe pulmonary nodule is unchanged. Compatible
with a benign finding.

Three-vessel coronary artery disease.

Stable bilateral adrenal adenomas.

Extensive colonic diverticulosis.

Aortic Atherosclerosis (EBBHV-4UB.B) and Emphysema (EBBHV-W7A.P).

## 2022-10-11 ENCOUNTER — Encounter: Payer: Self-pay | Admitting: Gastroenterology

## 2022-10-23 ENCOUNTER — Encounter: Payer: Self-pay | Admitting: Gastroenterology

## 2022-10-23 ENCOUNTER — Ambulatory Visit (AMBULATORY_SURGERY_CENTER): Payer: Medicare HMO | Admitting: *Deleted

## 2022-10-23 VITALS — Ht 71.0 in | Wt 230.0 lb

## 2022-10-23 DIAGNOSIS — Z86010 Personal history of colonic polyps: Secondary | ICD-10-CM

## 2022-10-23 MED ORDER — NA SULFATE-K SULFATE-MG SULF 17.5-3.13-1.6 GM/177ML PO SOLN: 1.0000 | Freq: Once | ORAL | 0 refills | Status: AC

## 2022-10-23 NOTE — Progress Notes (Addendum)
Pt's name and DOB verified at the beginning of the pre-visit.  Pt dstates he uses a walker when his gout is acting up and a cane when it doesn't. He can stand for a short period of time. No issues sitting laying down or moving side to side.   No egg or soy allergy known to patient  No issues known to pt with past sedation with any surgeries or procedures Patient denies ever being intubated Pt has no issues moving head neck or swallowing No FH of Malignant Hyperthermia Pt is not on diet pills Pt is not on home 02  Pt is not on blood thinners  Pt denies issues with constipation  Pt is not on dialysis Pt denies any upcoming cardiac testing Pt encouraged to use to use Singlecare or Goodrx to reduce cost  Patient's chart reviewed by Cathlyn Parsons CNRA prior to pre-visit and patient appropriate for the [facility].  Pre-visit completed and red dot placed by patient's name on their procedure day (on provider's schedule).  . Visit by phone Pt states weight is 230lb Instructed pt why it is important to and  to call if they have any changes in health or new medications. Directed them to the # given and on instructions.   Pt states they will.  Instructions reviewed with pt and pt states understanding. Instructed to review again prior to procedure. Pt states they will.  Instructions sent by mail with coupon and by my chart

## 2022-11-03 ENCOUNTER — Encounter: Payer: Self-pay | Admitting: Certified Registered Nurse Anesthetist

## 2022-11-07 ENCOUNTER — Encounter: Payer: Self-pay | Admitting: Gastroenterology

## 2022-11-07 ENCOUNTER — Ambulatory Visit (AMBULATORY_SURGERY_CENTER): Payer: 59 | Admitting: Gastroenterology

## 2022-11-07 VITALS — BP 164/97 | HR 79 | Temp 97.8°F | Resp 15 | Ht 71.0 in | Wt 230.0 lb

## 2022-11-07 DIAGNOSIS — Z09 Encounter for follow-up examination after completed treatment for conditions other than malignant neoplasm: Secondary | ICD-10-CM | POA: Diagnosis not present

## 2022-11-07 DIAGNOSIS — Z86010 Personal history of colonic polyps: Secondary | ICD-10-CM | POA: Diagnosis not present

## 2022-11-07 DIAGNOSIS — D122 Benign neoplasm of ascending colon: Secondary | ICD-10-CM | POA: Diagnosis not present

## 2022-11-07 MED ORDER — SODIUM CHLORIDE 0.9 % IV SOLN: 500.0000 mL | Freq: Once | INTRAVENOUS | Status: DC

## 2022-11-07 NOTE — Op Note (Signed)
[facility] Patient Name: Steven Delacruz Procedure Date: 11/07/2022 9:45 AM MRN: [id number] Endoscopist: Napoleon Form , MD, [id number] Age: 72 Referring MD:  Date of Birth: [birth year] Gender: Male Account #: [id number] Procedure:                Colonoscopy Indications:              High risk colon cancer surveillance: Personal                            history of colonic polyps, High risk colon cancer                            surveillance: Personal history of adenoma (10 mm or                            greater in size), High risk colon cancer                            surveillance: Personal history of multiple (3 or                            more) adenomas Medicines:                Monitored Anesthesia Care Procedure:                Pre-Anesthesia Assessment:                           - Prior to the procedure, a History and Physical                            was performed, and patient medications and                            allergies were reviewed. The patient's tolerance of                            previous anesthesia was also reviewed. The risks                            and benefits of the procedure and the sedation                            options and risks were discussed with the patient.                            All questions were answered, and informed consent                            was obtained. Prior Anticoagulants: The patient has                            taken no anticoagulant or antiplatelet agents. ASA  Grade Assessment: III - A patient with severe                            systemic disease. After reviewing the risks and                            benefits, the patient was deemed in satisfactory                            condition to undergo the procedure.                           After obtaining informed consent, the colonoscope                            was passed under direct vision. Throughout the                             procedure, the patient's blood pressure, pulse, and                            oxygen saturations were monitored continuously. The                            PCF-HQ190L Colonoscope [id number] was introduced                            through the anus and advanced to the the cecum,                            identified by appendiceal orifice and ileocecal                            valve. The colonoscopy was somewhat difficult due                            to inadequate bowel prep. Successful completion of                            the procedure was aided by lavage. The patient                            tolerated the procedure well. The quality of the                            bowel preparation was adequate to identify polyps                            greater than 5 mm in size. The ileocecal valve,                            appendiceal orifice, and rectum were photographed. Scope In: 9:50:56 AM Scope Out: 10:05:43 AM Scope Withdrawal Time: 0 hours 12 minutes 32  seconds  Total Procedure Duration: 0 hours 14 minutes 47 seconds  Findings:                 The perianal and digital rectal examinations were                            normal.                           A 5 mm polyp was found in the ascending colon. The                            polyp was sessile. The polyp was removed with a                            cold snare. Resection and retrieval were complete.                           Scattered large-mouthed, medium-mouthed and                            small-mouthed diverticula were found in the sigmoid                            colon, descending colon, transverse colon and                            ascending colon.                           Non-bleeding external and internal hemorrhoids were                            found during retroflexion. The hemorrhoids were                            medium-sized. Complications:            No immediate  complications. Estimated Blood Loss:     Estimated blood loss was minimal. Impression:               - One 5 mm polyp in the ascending colon, removed                            with a cold snare. Resected and retrieved.                           - Severe diverticulosis in the sigmoid colon, in                            the descending colon, in the transverse colon and                            in the ascending colon.                           -  Non-bleeding external and internal hemorrhoids. Recommendation:           - Patient has a contact number available for                            emergencies. The signs and symptoms of potential                            delayed complications were discussed with the                            patient. Return to normal activities tomorrow.                            Written discharge instructions were provided to the                            patient.                           - Resume previous diet.                           - Continue present medications.                           - Await pathology results.                           - Repeat colonoscopy in 5 years for surveillance                            based on pathology results. Napoleon Form, MD 11/07/2022 10:13:17 AM This report has been signed electronically.

## 2022-11-07 NOTE — Patient Instructions (Signed)

## 2022-11-07 NOTE — Progress Notes (Signed)
Called to room to assist during endoscopic procedure.  Patient ID and intended procedure confirmed with present staff. Received instructions for my participation in the procedure from the performing physician.  

## 2022-11-07 NOTE — Progress Notes (Signed)
[facility] History and Physical   Primary Care Physician:  Ellyn Hack, MD   Reason for Procedure:  History of adenomatous colon polyps  Plan:    Surveillance colonoscopy with possible interventions as needed     HPI: Steven Delacruz is a very pleasant 72 y.o. male here for surveillance colonoscopy. Denies any nausea, vomiting, abdominal pain, melena or bright red blood per rectum  The risks and benefits as well as alternatives of endoscopic procedure(s) have been discussed and reviewed. All questions answered. The patient agrees to proceed.    Past Medical History:  Diagnosis Date   Abdominal aortic atherosclerosis (HCC) 05/13/2013   Abnormal CT scan, pancreas - ? focal pancreatitis  05/13/2013   Allergy    Arthritis    Asthma    Chronic back pain    COPD (chronic obstructive pulmonary disease) (HCC)    Headache(784.0)    Hyperlipidemia    Hypertension    Myocardial infarction [facility])    Right shoulder pain     Past Surgical History:  Procedure Laterality Date   NO PAST SURGERIES      Prior to Admission medications   Medication Sig Start Date End Date Taking? Authorizing Provider  acetaminophen (TYLENOL) 650 MG CR tablet Take 650 mg by mouth every 8 (eight) hours as needed for pain.   Yes [provider]  albuterol (VENTOLIN HFA) 108 (90 Base) MCG/ACT inhaler Inhale 1 puff into the lungs daily. 01/18/20  Yes [provider]  allopurinol (ZYLOPRIM) 300 MG tablet Take 300 mg by mouth daily. 12/17/21  Yes [provider]  amLODipine (NORVASC) 10 MG tablet Take 1 tablet (10 mg total) by mouth daily. 10/16/18  Yes Marcine Matar, MD  atorvastatin (LIPITOR) 10 MG tablet Take 10 mg by mouth daily. 01/18/20  Yes [provider]  carvedilol (COREG) 12.5 MG tablet Take 12.5 mg by mouth 2 (two) times daily. 01/18/20  Yes [provider]  colchicine 0.6 MG tablet TAKE 2 TABLETS NOW AND 1 TABLET IN 1 HOUR   Yes  [provider]  cyclobenzaprine (FLEXERIL) 10 MG tablet Take 1 tablet (10 mg total) by mouth 3 (three) times daily as needed for muscle spasms. 12/22/21  Yes Roxy Horseman, PA-C  DHA-EPA-Vitamin E (OMEGA-3 COMPLEX PO) Take 1 capsule by mouth daily.   Yes [provider]  hydrALAZINE (APRESOLINE) 25 MG tablet Take 25 mg by mouth 3 (three) times daily. 11/29/21  Yes [provider]  Multiple Vitamins-Minerals (CENTRUM SILVER 50+MEN) TABS Take 1 tablet by mouth daily. 12/12/21  Yes [provider]  spironolactone (ALDACTONE) 25 MG tablet Take 1 tablet (25 mg total) by mouth daily. Patient taking differently: Take 25 mg by mouth every other day. 10/16/18  Yes Marcine Matar, MD  tamsulosin (FLOMAX) 0.4 MG CAPS capsule Take 0.4 mg by mouth every morning. 11/29/21  Yes [provider]  diclofenac Sodium (VOLTAREN) 1 % GEL Apply topically. Patient not taking: Reported on 11/07/2022    [provider]  TRELEGY ELLIPTA 100-62.5-25 MCG/ACT AEPB Inhale 1 puff into the lungs daily. Patient not taking: Reported on 10/23/2022 12/12/21   [provider]    Current Outpatient Medications  Medication Sig Dispense Refill   acetaminophen (TYLENOL) 650 MG CR tablet Take 650 mg by mouth every 8 (eight) hours as needed for pain.     albuterol (VENTOLIN HFA) 108 (90 Base) MCG/ACT inhaler Inhale 1 puff into the lungs daily.  allopurinol (ZYLOPRIM) 300 MG tablet Take 300 mg by mouth daily.     amLODipine (NORVASC) 10 MG tablet Take 1 tablet (10 mg total) by mouth daily. 90 tablet 3   atorvastatin (LIPITOR) 10 MG tablet Take 10 mg by mouth daily.     carvedilol (COREG) 12.5 MG tablet Take 12.5 mg by mouth 2 (two) times daily.     colchicine 0.6 MG tablet TAKE 2 TABLETS NOW AND 1 TABLET IN 1 HOUR     cyclobenzaprine (FLEXERIL) 10 MG tablet Take 1 tablet (10 mg total) by mouth 3 (three) times daily as needed for muscle spasms. 10 tablet 0   DHA-EPA-Vitamin E  (OMEGA-3 COMPLEX PO) Take 1 capsule by mouth daily.     hydrALAZINE (APRESOLINE) 25 MG tablet Take 25 mg by mouth 3 (three) times daily.     Multiple Vitamins-Minerals (CENTRUM SILVER 50+MEN) TABS Take 1 tablet by mouth daily.     spironolactone (ALDACTONE) 25 MG tablet Take 1 tablet (25 mg total) by mouth daily. (Patient taking differently: Take 25 mg by mouth every other day.) 90 tablet 3   tamsulosin (FLOMAX) 0.4 MG CAPS capsule Take 0.4 mg by mouth every morning.     diclofenac Sodium (VOLTAREN) 1 % GEL Apply topically. (Patient not taking: Reported on 11/07/2022)     TRELEGY ELLIPTA 100-62.5-25 MCG/ACT AEPB Inhale 1 puff into the lungs daily. (Patient not taking: Reported on 10/23/2022)     Current Facility-Administered Medications  Medication Dose Route Frequency Provider Last Rate Last Admin   0.9 %  sodium chloride infusion  500 mL Intravenous Once Napoleon Form, MD        Allergies as of 11/07/2022 - Review Complete 11/07/2022  Allergen Reaction Noted   Lisinopril Swelling 12/10/2012    Family History  Problem Relation Age of Onset   Colon cancer Neg Hx    Colon polyps Neg Hx    Esophageal cancer Neg Hx    Rectal cancer Neg Hx    Stomach cancer Neg Hx     Social History   Socioeconomic History   Marital status: Divorced    Spouse name: Not on file   Number of children: Not on file   Years of education: Not on file   Highest education level: Not on file  Occupational History   Not on file  Tobacco Use   Smoking status: Former    Packs/day: 0.25    Years: 45.00    Additional pack years: 0.00    Total pack years: 11.25    Types: Cigarettes    Quit date: 2024    Years since quitting: 0.3   Smokeless tobacco: Never   Tobacco comments:    chew sticks  Vaping Use   Vaping Use: Every day  Substance and Sexual Activity   Alcohol use: No    Comment: quit drinking last weekend   Drug use: Not Currently    Types: Marijuana    Comment: occassional   Sexual  activity: Yes  Other Topics Concern   Not on file  Social History Narrative   Not on file   Social Determinants of Health   Financial Resource Strain: Not on file  Food Insecurity: Not on file  Transportation Needs: Not on file  Physical Activity: Not on file  Stress: Not on file  Social Connections: Not on file  Intimate Partner Violence: Not on file    Review of Systems:  All other review of systems negative except as mentioned  in the HPI.  Physical Exam: Vital signs in last 24 hours: Blood Pressure 131/63   Pulse 77   Temperature 97.8 F (36.6 C) (Skin)   Height 5\' 11"  (1.803 m)   Weight 230 lb (104.3 kg)   Oxygen Saturation 97%   Body Mass Index 32.08 kg/m  General:   Alert, NAD Lungs:  Clear .   Heart:  Regular rate and rhythm Abdomen:  Soft, nontender and nondistended. Neuro/Psych:  Alert and cooperative. Normal mood and affect. A and O x 3  Reviewed labs, radiology imaging, old records and pertinent past GI work up  Patient is appropriate for planned procedure(s) and anesthesia in an ambulatory setting   K. Scherry Ran , MD [phone]

## 2022-11-07 NOTE — Progress Notes (Signed)
Pt's states no medical or surgical changes since previsit or office visit. 

## 2022-11-07 NOTE — Progress Notes (Signed)
Report given to PACU, vss 

## 2022-11-08 ENCOUNTER — Telehealth: Payer: Self-pay | Admitting: *Deleted

## 2022-11-08 NOTE — Telephone Encounter (Signed)
Post procedure follow up phone call. No answer at number given.  Left message on voicemail.  

## 2022-11-13 ENCOUNTER — Encounter: Payer: Self-pay | Admitting: Gastroenterology
